# Patient Record
Sex: Female | Born: 1950 | Race: Black or African American | Hispanic: No | Smoking: Never smoker
Health system: Southern US, Community
[De-identification: ages and names within clinical notes are randomized; demographics above are authoritative.]

## PROBLEM LIST (undated history)

## (undated) DIAGNOSIS — E7800 Pure hypercholesterolemia, unspecified: Secondary | ICD-10-CM

## (undated) DIAGNOSIS — N189 Chronic kidney disease, unspecified: Secondary | ICD-10-CM

## (undated) DIAGNOSIS — M1990 Unspecified osteoarthritis, unspecified site: Secondary | ICD-10-CM

## (undated) DIAGNOSIS — H269 Unspecified cataract: Secondary | ICD-10-CM

## (undated) DIAGNOSIS — T7840XA Allergy, unspecified, initial encounter: Secondary | ICD-10-CM

## (undated) DIAGNOSIS — E079 Disorder of thyroid, unspecified: Secondary | ICD-10-CM

## (undated) DIAGNOSIS — I10 Essential (primary) hypertension: Secondary | ICD-10-CM

## (undated) DIAGNOSIS — E119 Type 2 diabetes mellitus without complications: Secondary | ICD-10-CM

## (undated) HISTORY — DX: Allergy, unspecified, initial encounter: T78.40XA

## (undated) HISTORY — DX: Unspecified cataract: H26.9

## (undated) HISTORY — PX: FRACTURE SURGERY: SHX138

## (undated) HISTORY — PX: COLONOSCOPY: SHX174

## (undated) HISTORY — DX: Chronic kidney disease, unspecified: N18.9

## (undated) HISTORY — PX: EYE SURGERY: SHX253

## (undated) HISTORY — DX: Essential (primary) hypertension: I10

## (undated) HISTORY — DX: Unspecified osteoarthritis, unspecified site: M19.90

## (undated) HISTORY — DX: Pure hypercholesterolemia, unspecified: E78.00

## (undated) HISTORY — PX: JOINT REPLACEMENT: SHX530

## (undated) HISTORY — PX: ABDOMINAL HYSTERECTOMY: SHX81

---

## 1998-08-18 ENCOUNTER — Other Ambulatory Visit: Admission: RE | Admit: 1998-08-18 | Discharge: 1998-08-18 | Payer: Self-pay | Admitting: Family Medicine

## 1999-10-24 ENCOUNTER — Other Ambulatory Visit: Admission: RE | Admit: 1999-10-24 | Discharge: 1999-10-24 | Payer: Self-pay | Admitting: Family Medicine

## 2001-05-12 ENCOUNTER — Other Ambulatory Visit: Admission: RE | Admit: 2001-05-12 | Discharge: 2001-05-12 | Payer: Self-pay | Admitting: Family Medicine

## 2003-09-21 ENCOUNTER — Other Ambulatory Visit: Admission: RE | Admit: 2003-09-21 | Discharge: 2003-09-21 | Payer: Self-pay | Admitting: Family Medicine

## 2005-02-08 ENCOUNTER — Other Ambulatory Visit: Admission: RE | Admit: 2005-02-08 | Discharge: 2005-02-08 | Payer: Self-pay | Admitting: Family Medicine

## 2005-04-22 ENCOUNTER — Emergency Department (HOSPITAL_COMMUNITY): Admission: EM | Admit: 2005-04-22 | Discharge: 2005-04-22 | Payer: Self-pay | Admitting: Emergency Medicine

## 2009-08-11 ENCOUNTER — Emergency Department (HOSPITAL_COMMUNITY): Admission: EM | Admit: 2009-08-11 | Discharge: 2009-08-11 | Payer: Self-pay | Admitting: Emergency Medicine

## 2010-05-30 DIAGNOSIS — N951 Menopausal and female climacteric states: Secondary | ICD-10-CM | POA: Insufficient documentation

## 2010-07-20 DIAGNOSIS — E785 Hyperlipidemia, unspecified: Secondary | ICD-10-CM | POA: Insufficient documentation

## 2010-07-20 DIAGNOSIS — K5900 Constipation, unspecified: Secondary | ICD-10-CM | POA: Insufficient documentation

## 2010-07-20 DIAGNOSIS — J309 Allergic rhinitis, unspecified: Secondary | ICD-10-CM | POA: Insufficient documentation

## 2010-07-20 DIAGNOSIS — N1831 Chronic kidney disease, stage 3a: Secondary | ICD-10-CM | POA: Insufficient documentation

## 2010-07-20 DIAGNOSIS — E039 Hypothyroidism, unspecified: Secondary | ICD-10-CM | POA: Insufficient documentation

## 2010-07-20 DIAGNOSIS — E119 Type 2 diabetes mellitus without complications: Secondary | ICD-10-CM | POA: Insufficient documentation

## 2010-07-24 DIAGNOSIS — E559 Vitamin D deficiency, unspecified: Secondary | ICD-10-CM | POA: Insufficient documentation

## 2010-07-26 LAB — URINALYSIS, ROUTINE W REFLEX MICROSCOPIC
Glucose, UA: NEGATIVE mg/dL
Protein, ur: 100 mg/dL — AB
Specific Gravity, Urine: 1.015 (ref 1.005–1.030)
Urobilinogen, UA: 0.2 mg/dL (ref 0.0–1.0)

## 2010-07-26 LAB — URINE CULTURE

## 2010-07-26 LAB — URINE MICROSCOPIC-ADD ON

## 2010-12-04 DIAGNOSIS — B372 Candidiasis of skin and nail: Secondary | ICD-10-CM | POA: Insufficient documentation

## 2012-01-02 ENCOUNTER — Encounter (HOSPITAL_COMMUNITY): Payer: Self-pay | Admitting: *Deleted

## 2012-01-02 ENCOUNTER — Emergency Department (HOSPITAL_COMMUNITY)
Admission: EM | Admit: 2012-01-02 | Discharge: 2012-01-02 | Disposition: A | Discharge status: Home / Self Care | Payer: 59 | Attending: Emergency Medicine | Admitting: Emergency Medicine

## 2012-01-02 DIAGNOSIS — Z91018 Allergy to other foods: Secondary | ICD-10-CM | POA: Insufficient documentation

## 2012-01-02 DIAGNOSIS — Z882 Allergy status to sulfonamides status: Secondary | ICD-10-CM | POA: Insufficient documentation

## 2012-01-02 DIAGNOSIS — R51 Headache: Secondary | ICD-10-CM | POA: Insufficient documentation

## 2012-01-02 DIAGNOSIS — I10 Essential (primary) hypertension: Secondary | ICD-10-CM | POA: Insufficient documentation

## 2012-01-02 HISTORY — DX: Disorder of thyroid, unspecified: E07.9

## 2012-01-02 HISTORY — DX: Essential (primary) hypertension: I10

## 2012-01-02 MED ORDER — IBUPROFEN 600 MG PO TABS: 600.0000 mg | ORAL_TABLET | Freq: Four times a day (QID) | ORAL | Status: AC | PRN

## 2012-01-02 NOTE — ED Notes (Signed)
Pt states approx 3 days ago started having right jaw pain. States went to dentist and nothing was found to be wrong with teeth. States now pain radiates from ear to jaw. States with certain movement pain will increase. States pain constant with intermittent sharp pain to right side of face. Sensation is equal bilateral to face. Facial features symmetric. Speech clear.
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
## 2012-12-08 LAB — HM COLONOSCOPY

## 2013-12-28 ENCOUNTER — Encounter: Payer: Self-pay | Admitting: Cardiology

## 2013-12-28 DIAGNOSIS — I10 Essential (primary) hypertension: Secondary | ICD-10-CM

## 2013-12-28 DIAGNOSIS — R9430 Abnormal result of cardiovascular function study, unspecified: Secondary | ICD-10-CM | POA: Insufficient documentation

## 2013-12-28 DIAGNOSIS — E7800 Pure hypercholesterolemia, unspecified: Secondary | ICD-10-CM

## 2013-12-28 HISTORY — DX: Essential (primary) hypertension: I10

## 2013-12-28 HISTORY — DX: Pure hypercholesterolemia, unspecified: E78.00

## 2013-12-29 ENCOUNTER — Ambulatory Visit (INDEPENDENT_AMBULATORY_CARE_PROVIDER_SITE_OTHER): Payer: 59 | Admitting: Interventional Cardiology

## 2013-12-29 ENCOUNTER — Encounter: Payer: Self-pay | Admitting: Interventional Cardiology

## 2013-12-29 VITALS — BP 138/80 | HR 71 | Ht 64.0 in | Wt 180.0 lb

## 2013-12-29 DIAGNOSIS — I10 Essential (primary) hypertension: Secondary | ICD-10-CM
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
## 2013-12-29 NOTE — Patient Instructions (Signed)
Your physician recommends that you schedule a follow-up appointment as needed  

## 2017-10-06 ENCOUNTER — Emergency Department: Payer: Worker's Compensation

## 2017-10-06 ENCOUNTER — Emergency Department
Admission: EM | Admit: 2017-10-06 | Discharge: 2017-10-07 | Disposition: A | Discharge status: Home / Self Care | Payer: Worker's Compensation | Attending: Emergency Medicine | Admitting: Emergency Medicine

## 2017-10-06 DIAGNOSIS — I10 Essential (primary) hypertension: Secondary | ICD-10-CM | POA: Insufficient documentation

## 2017-10-06 DIAGNOSIS — S99911A Unspecified injury of right ankle, initial encounter: Secondary | ICD-10-CM | POA: Diagnosis present

## 2017-10-06 DIAGNOSIS — W010XXA Fall on same level from slipping, tripping and stumbling without subsequent striking against object, initial encounter: Secondary | ICD-10-CM | POA: Insufficient documentation

## 2017-10-06 DIAGNOSIS — Y929 Unspecified place or not applicable: Secondary | ICD-10-CM | POA: Insufficient documentation

## 2017-10-06 DIAGNOSIS — Z7982 Long term (current) use of aspirin: Secondary | ICD-10-CM | POA: Insufficient documentation

## 2017-10-06 DIAGNOSIS — S9304XA Dislocation of right ankle joint, initial encounter: Secondary | ICD-10-CM | POA: Diagnosis not present

## 2017-10-06 DIAGNOSIS — S82831A Other fracture of upper and lower end of right fibula, initial encounter for closed fracture: Secondary | ICD-10-CM | POA: Insufficient documentation

## 2017-10-06 DIAGNOSIS — Z79899 Other long term (current) drug therapy: Secondary | ICD-10-CM | POA: Insufficient documentation

## 2017-10-06 DIAGNOSIS — Y9389 Activity, other specified: Secondary | ICD-10-CM | POA: Diagnosis not present

## 2017-10-06 DIAGNOSIS — S82851A Displaced trimalleolar fracture of right lower leg, initial encounter for closed fracture: Secondary | ICD-10-CM

## 2017-10-06 DIAGNOSIS — Y990 Civilian activity done for income or pay: Secondary | ICD-10-CM | POA: Diagnosis not present

## 2017-10-06 DIAGNOSIS — S82391A Other fracture of lower end of right tibia, initial encounter for closed fracture: Secondary | ICD-10-CM | POA: Diagnosis not present

## 2017-10-06 MED ORDER — HYDROMORPHONE HCL 1 MG/ML IJ SOLN
1.0000 mg | Freq: Once | INTRAMUSCULAR | Status: AC
  Administered 2017-10-06: 1 mg via INTRAVENOUS

## 2017-10-06 MED ORDER — ONDANSETRON HCL 4 MG/2ML IJ SOLN
INTRAMUSCULAR | Status: AC
  Filled 2017-10-06: qty 2

## 2017-10-06 MED ORDER — HYDROMORPHONE HCL 1 MG/ML IJ SOLN
1.0000 mg | Freq: Once | INTRAMUSCULAR | Status: AC
  Administered 2017-10-06: 1 mg via INTRAVENOUS
  Filled 2017-10-06: qty 1

## 2017-10-06 MED ORDER — LIDOCAINE HCL (PF) 1 % IJ SOLN
INTRAMUSCULAR | Status: AC
  Administered 2017-10-06: 5 mL
  Filled 2017-10-06: qty 15

## 2017-10-06 MED ORDER — HYDROMORPHONE HCL 1 MG/ML IJ SOLN
INTRAMUSCULAR | Status: DC
Start: 2017-10-06 — End: 2017-10-07
  Filled 2017-10-06: qty 1

## 2017-10-06 MED ORDER — LORAZEPAM 1 MG PO TABS
1.0000 mg | ORAL_TABLET | Freq: Once | ORAL | Status: AC
Start: 2017-10-06 — End: 2017-10-06
  Administered 2017-10-06: 1 mg via ORAL
  Filled 2017-10-06: qty 1

## 2017-10-06 MED ORDER — ONDANSETRON HCL 4 MG/2ML IJ SOLN
4.0000 mg | Freq: Once | INTRAMUSCULAR | Status: AC
Start: 2017-10-06 — End: 2017-10-06
  Administered 2017-10-06: 4 mg via INTRAVENOUS

## 2017-10-06 NOTE — ED Triage Notes (Signed)
[facility]  Pt fell and has obvious deformity to R ankle.  Pt is A&ox4, in NAD.

## 2017-10-06 NOTE — ED Provider Notes (Signed)
[facility] Emergency Department Provider Note  ____________________________________________  Time seen: Approximately 9:26 PM  I have reviewed the triage vital signs and the nursing notes.   HISTORY  Chief Complaint Ankle Pain    HPI Kelli Bond is a 67 y.o. female who presents the emergency department with a complaint of right ankle injury.  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  Patient was using a buffer machine when she slipped, catching her foot.  Patient reports obvious deformity which is correlated by EMS.  EMS has patient's ankle stabilized using a pillow surrounded by tape.  Patient reports significant pain with no mobility of the ankle at this time.  Patient did not hit her head or lose consciousness during the injury.  Patient was given 100 mcg of fentanyl in route by EMS.  IV established.  No other injury or complaint at this time.  No history of previous ankle injury or surgery.  Past Medical History:  Diagnosis Date  . Essential hypertension, benign 12/28/2013  . Hypertension   . Pure hypercholesterolemia 12/28/2013  . Thyroid disease     Patient Active Problem List   Diagnosis Date Noted  . Nonspecific abnormal unspecified cardiovascular function study 12/28/2013  . Pure hypercholesterolemia 12/28/2013  . Essential hypertension, benign 12/28/2013    Past Surgical History:  Procedure Laterality Date  . ABDOMINAL HYSTERECTOMY      Prior to Admission medications   Medication Sig Start Date End Date Taking? Authorizing Provider  aspirin EC 81 MG tablet Take 81 mg by mouth daily.    [provider]  cetirizine (ZYRTEC) 10 MG tablet Take 10 mg by mouth at bedtime.    [provider]  cholecalciferol (VITAMIN D) 1000 UNITS tablet Take 1,000 Units by mouth at bedtime.    [provider]  levothyroxine (SYNTHROID, LEVOTHROID) 112 MCG tablet Take 112 mcg by mouth  daily.    [provider]  lisinopril (PRINIVIL,ZESTRIL) 10 MG tablet Take 10 mg by mouth at bedtime.    [provider]  Olopatadine HCl (PATADAY) 0.2 % SOLN Apply 1 drop to eye at bedtime.    [provider]  oxyCODONE-acetaminophen (PERCOCET/ROXICET) 5-325 MG tablet Take 1 tablet by mouth every 6 (six) hours as needed for severe pain. 10/07/17   Cuthriell, Delorise Royals, PA-C  rosuvastatin (CRESTOR) 20 MG tablet Take 20 mg by mouth daily.    [provider]  saxagliptin HCl (ONGLYZA) 2.5 MG TABS tablet Take 2.5 mg by mouth daily.    [provider]    Allergies Banana and Sulfa antibiotics  Family History  Problem Relation Age of Onset  . Hypertension Father   . Kidney disease Father   . Bradycardia Mother        pacemaker  . Breast cancer Sister   . Kidney disease Brother     Social History Social History   Tobacco Use  . Smoking status: Never Smoker  Substance Use Topics  . Alcohol use: Not Currently  . Drug use: Not Currently     Review of Systems  Constitutional: No fever/chills Eyes: No visual changes.  Cardiovascular: no chest pain. Respiratory: no cough. No SOB. Gastrointestinal: No abdominal pain.  No nausea, no vomiting. Musculoskeletal: Positive for right ankle injury/deformity Skin: Negative for rash, abrasions, lacerations, ecchymosis. Neurological: Negative for headaches, focal weakness or numbness. 10-point ROS otherwise negative.  ____________________________________________   PHYSICAL EXAM:  VITAL SIGNS: ED  Triage Vitals [10/06/17 1935]  Enc Vitals Group     BP (!) 142/62     Pulse Rate 70     Resp 18     Temp 97.8 F (36.6 C)     Temp Source Oral     SpO2 97 %     Weight 180 lb (81.6 kg)     Height      Head Circumference      Peak Flow      Pain Score 9     Pain Loc      Pain Edu?      Excl. in GC?      Constitutional: Alert and oriented. Well appearing and in no acute distress. Eyes:  Conjunctivae are normal. PERRL. EOMI. Head: Atraumatic. Neck: No stridor.    Cardiovascular: Normal rate, regular rhythm. Normal S1 and S2.  Good peripheral circulation. Respiratory: Normal respiratory effort without tachypnea or retractions. Lungs CTAB. Good air entry to the bases with no decreased or absent breath sounds. Musculoskeletal: Patient with right lower extremity/ankle immobilized using pillow and tape from EMS.  After cutting through tape and exposing ankle, obvious deformity to the left ankle with rotation and angulation of the foot.  Dorsalis pedis pulse had been appreciated by EMS and marked in the field.  Patient's foot is cool but appreciable dorsalis pedis pulse.  Sensation intact all 5 digits.  Capillary refill less than 2 seconds all digits.  Palpation of the ankle reveals significant deformity to bilateral malleolus. Neurologic:  Normal speech and language. No gross focal neurologic deficits are appreciated.  Skin:  Skin is warm, dry and intact. No rash noted. Psychiatric: Mood and affect are normal. Speech and behavior are normal. Patient exhibits appropriate insight and judgement.   ____________________________________________   LABS (all labs ordered are listed, but only abnormal results are displayed)  Labs Reviewed - No data to display ____________________________________________  EKG   ____________________________________________  RADIOLOGY Festus Barren Cuthriell, personally viewed and evaluated these images (plain radiographs) as part of my medical decision making, as well as reviewing the written report by the radiologist.  Concur with radiologist finding of acute, comminuted distal tibial and fibular fractures with dislocation of the talus.  Postreduction films reveal  Dg Tibia/fibula Right  Result Date: 10/06/2017 CLINICAL DATA:  Recent fall with ankle deformity EXAM: RIGHT TIBIA AND FIBULA - 2 VIEW COMPARISON:  None. FINDINGS: Distal tibial and fibular  fractures are noted with lateral and posterior displacement of the talus with respect to the distal tibia. No definitive posterior malleolar fracture is noted. IMPRESSION: Distal tibial and fibular fractures with talar dislocation Electronically Signed   By: Alcide Clever M.D.   On: 10/06/2017 20:31   Dg Ankle Complete Right  Result Date: 10/06/2017 CLINICAL DATA:  Fracture, postreduction. EXAM: RIGHT ANKLE - COMPLETE 3+ VIEW COMPARISON:  Pre reduction radiographs earlier this day. FINDINGS: Improved alignment of trimalleolar fractures postreduction. Oblique distal fibular fracture with improved alignment, mild residual displacement. Transverse medial malleolar fracture with mild residual displacement, minimal residual displacement posterior malleolar fracture. Improved ankle mortise alignment with mild residual widening of the medial clear space. Overlying cast material in place limits osseous and soft tissue fine detail. IMPRESSION: Improved distal tibia and fibular fractures postreduction. Electronically Signed   By: Rubye Oaks M.D.   On: 10/06/2017 23:42   Dg Ankle Complete Right  Result Date: 10/06/2017 CLINICAL DATA:  Recent fall with ankle deformity EXAM: RIGHT ANKLE - COMPLETE 3+ VIEW COMPARISON:  None. FINDINGS: Comminuted distal fibular and tibial fractures are noted. There is lateral and posterior displacement of the talus with respect to the distal tibia. Soft tissue swelling is noted. IMPRESSION: Comminuted distal tibial and fibular fractures with posterior and lateral dislocation of the talus Electronically Signed   By: Alcide CleverMark  Lukens M.D.   On: 10/06/2017 20:28    ____________________________________________    PROCEDURES  Procedure(s) performed:    .Nerve Block Date/Time: 10/06/2017 10:58 PM Performed by: Racheal Patchesuthriell, Jonathan D, PA-C Authorized by: Racheal Patchesuthriell, Jonathan D, PA-C   Consent:    Consent obtained:  Verbal   Consent given by:  Patient   Risks discussed:  Swelling,  pain and unsuccessful block Indications:    Indications:  Pain relief and procedural anesthesia Location:    Body area:  Lower extremity   Lower extremity nerve:  Sural (and superficial peroneal)   Laterality:  Right Pre-procedure details:    Skin preparation:  Alcohol   Preparation: Patient was prepped and draped in usual sterile fashion   Skin anesthesia (see MAR for exact dosages):    Skin anesthesia method:  None Procedure details (see MAR for exact dosages):    Block needle gauge:  22 G   Anesthetic injected:  Lidocaine 1% w/o epi   Steroid injected:  None   Additive injected:  None   Injection procedure:  Anatomic landmarks identified, anatomic landmarks palpated, introduced needle, incremental injection and negative aspiration for blood Post-procedure details:    Dressing:  None   Outcome:  Anesthesia achieved   Patient tolerance of procedure:  Tolerated well, no immediate complications Reduction of fracture Date/Time: 10/06/2017 11:00 PM Performed by: Racheal Patchesuthriell, Jonathan D, PA-C Authorized by: Racheal Patchesuthriell, Jonathan D, PA-C  Consent: Verbal consent obtained. Risks and benefits: risks, benefits and alternatives were discussed Consent given by: patient Patient understanding: patient states understanding of the procedure being performed Required items: required blood products, implants, devices, and special equipment available Patient identity confirmed: verbally with patient and arm band Time out: Immediately prior to procedure a "time out" was called to verify the correct patient, procedure, equipment, support staff and site/side marked as required. Preparation: Patient was prepped and draped in the usual sterile fashion. Local anesthesia used: Nerve block.  Anesthesia: Local anesthesia used: Nerve block.  Sedation: Patient sedated: no  Patient tolerance: Patient tolerated the procedure well with no immediate complications       Medications  ondansetron (ZOFRAN)  injection 4 mg (4 mg Intravenous Given 10/06/17 1942)  HYDROmorphone (DILAUDID) injection 1 mg (1 mg Intravenous Given 10/06/17 1942)  lidocaine (PF) (XYLOCAINE) 1 % injection (5 mLs  Given 10/06/17 2223)  HYDROmorphone (DILAUDID) injection 1 mg (1 mg Intravenous Given 10/06/17 2222)  LORazepam (ATIVAN) tablet 1 mg (1 mg Oral Given 10/06/17 2222)     ____________________________________________   INITIAL IMPRESSION / ASSESSMENT AND PLAN / ED COURSE  Pertinent labs & imaging results that were available during my care of the patient were reviewed by me and considered in my medical decision making (see chart for details).  Review of the [place] CSRS was performed in accordance of the [facility] prior to dispensing any controlled drugs.     Patient's diagnosis is consistent with trimalleolar fracture with talus dislocation.  Patient presented with significant ankle injury with obvious deformity.  Patient was neurovascularly intact upon initial assessment.  X-ray reveals significant fibular and tibia fractures with associated talus dislocation.  I discussed the case with Dr. Rosita KeaMenz, the on-call orthopedic surgeon.  He recommends  reduction in the emergency department, splinting, follow-up with orthopedics.  Patient was given pain medication, antianxiety medication, nerve blocks of the sural and superficial peroneal nerves with good anesthesia.  Ankle is manually reduced in the emergency department with good palpable reduction.  Postreduction films revealed significant improvement in fractures, alignment of fibula, tibia, ankle mortise joint.  Splint is applied, using posterior OCL and stirrup ankle splint.  Patient is given crutches for ambulation.. Patient will be discharged home with prescriptions for Percocet for pain relief.. Patient is to follow up with orthopedics for further management or primary care as needed or otherwise directed. Patient is given ED precautions to return to the ED for any worsening or new  symptoms.     ____________________________________________  FINAL CLINICAL IMPRESSION(S) / ED DIAGNOSES  Final diagnoses:  Closed trimalleolar fracture of right ankle, initial encounter  Closed dislocation of right talus, initial encounter      NEW MEDICATIONS STARTED DURING THIS VISIT:  ED Discharge Orders        Ordered    oxyCODONE-acetaminophen (PERCOCET/ROXICET) 5-325 MG tablet  Every 6 hours PRN     10/07/17 0010          This chart was dictated using voice recognition software/Dragon. Despite best efforts to proofread, errors can occur which can change the meaning. Any change was purely unintentional.    Racheal Patches, PA-C 10/07/17 0010    Phineas Semen, MD 10/09/17 1101

## 2017-10-06 NOTE — ED Notes (Addendum)
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 No drug screen was performed.

## 2017-10-07 MED ORDER — OXYCODONE-ACETAMINOPHEN 5-325 MG PO TABS: 1.0000 | ORAL_TABLET | Freq: Four times a day (QID) | ORAL | 0 refills | Status: DC | PRN

## 2017-10-07 MED ORDER — OXYCODONE-ACETAMINOPHEN 5-325 MG PO TABS
2.0000 | ORAL_TABLET | Freq: Once | ORAL | Status: AC
  Administered 2017-10-07: 2 via ORAL
  Filled 2017-10-07: qty 2

## 2017-10-07 MED ORDER — OXYCODONE-ACETAMINOPHEN 5-325 MG PO TABS: 1.0000 | ORAL_TABLET | Freq: Once | ORAL | Status: DC

## 2017-10-07 NOTE — ED Notes (Signed)
Pt discharged to home.  Family member driving.  Discharge instructions reviewed.  Verbalized understanding.  No questions or concerns at this time.  Teach back verified.  Pt in NAD.  No items left in ED.   

## 2017-10-08 DIAGNOSIS — S82851A Displaced trimalleolar fracture of right lower leg, initial encounter for closed fracture: Secondary | ICD-10-CM | POA: Insufficient documentation

## 2017-10-10 ENCOUNTER — Encounter
Admission: RE | Disposition: A | Discharge status: Home / Self Care | Payer: Self-pay | Source: Ambulatory Visit | Attending: Orthopedic Surgery

## 2017-10-10 ENCOUNTER — Ambulatory Visit: Payer: Worker's Compensation | Admitting: Anesthesiology

## 2017-10-10 ENCOUNTER — Ambulatory Visit
Admission: RE | Admit: 2017-10-10 | Discharge: 2017-10-10 | Disposition: A | Discharge status: Home / Self Care | Payer: Worker's Compensation | Source: Ambulatory Visit | Attending: Orthopedic Surgery | Admitting: Orthopedic Surgery

## 2017-10-10 ENCOUNTER — Ambulatory Visit: Payer: Worker's Compensation

## 2017-10-10 ENCOUNTER — Other Ambulatory Visit: Payer: Self-pay

## 2017-10-10 DIAGNOSIS — Y990 Civilian activity done for income or pay: Secondary | ICD-10-CM | POA: Insufficient documentation

## 2017-10-10 DIAGNOSIS — E119 Type 2 diabetes mellitus without complications: Secondary | ICD-10-CM | POA: Diagnosis not present

## 2017-10-10 DIAGNOSIS — Y9289 Other specified places as the place of occurrence of the external cause: Secondary | ICD-10-CM | POA: Insufficient documentation

## 2017-10-10 DIAGNOSIS — Y9389 Activity, other specified: Secondary | ICD-10-CM | POA: Insufficient documentation

## 2017-10-10 DIAGNOSIS — Z79899 Other long term (current) drug therapy: Secondary | ICD-10-CM | POA: Insufficient documentation

## 2017-10-10 DIAGNOSIS — Z7984 Long term (current) use of oral hypoglycemic drugs: Secondary | ICD-10-CM | POA: Diagnosis not present

## 2017-10-10 DIAGNOSIS — Z882 Allergy status to sulfonamides status: Secondary | ICD-10-CM | POA: Diagnosis not present

## 2017-10-10 DIAGNOSIS — E039 Hypothyroidism, unspecified: Secondary | ICD-10-CM | POA: Insufficient documentation

## 2017-10-10 DIAGNOSIS — W3189XA Contact with other specified machinery, initial encounter: Secondary | ICD-10-CM | POA: Insufficient documentation

## 2017-10-10 DIAGNOSIS — S82851A Displaced trimalleolar fracture of right lower leg, initial encounter for closed fracture: Secondary | ICD-10-CM | POA: Diagnosis not present

## 2017-10-10 DIAGNOSIS — E7800 Pure hypercholesterolemia, unspecified: Secondary | ICD-10-CM | POA: Insufficient documentation

## 2017-10-10 DIAGNOSIS — I10 Essential (primary) hypertension: Secondary | ICD-10-CM | POA: Diagnosis not present

## 2017-10-10 DIAGNOSIS — Z419 Encounter for procedure for purposes other than remedying health state, unspecified: Secondary | ICD-10-CM

## 2017-10-10 DIAGNOSIS — Z7982 Long term (current) use of aspirin: Secondary | ICD-10-CM | POA: Diagnosis not present

## 2017-10-10 HISTORY — DX: Type 2 diabetes mellitus without complications: E11.9

## 2017-10-10 HISTORY — PX: ORIF ANKLE FRACTURE: SHX5408

## 2017-10-10 LAB — GLUCOSE, CAPILLARY
GLUCOSE-CAPILLARY: 58 mg/dL — AB (ref 65–99)
Glucose-Capillary: 116 mg/dL — ABNORMAL HIGH (ref 65–99)

## 2017-10-10 SURGERY — OPEN REDUCTION INTERNAL FIXATION (ORIF) ANKLE FRACTURE
Anesthesia: General | Site: Ankle | Laterality: Right | Wound class: Clean

## 2017-10-10 MED ORDER — HYDROMORPHONE HCL 1 MG/ML IJ SOLN: 0.2500 mg | INTRAMUSCULAR | Status: DC | PRN

## 2017-10-10 MED ORDER — MEPERIDINE HCL 50 MG/ML IJ SOLN: 6.2500 mg | INTRAMUSCULAR | Status: DC | PRN

## 2017-10-10 MED ORDER — NEOMYCIN-POLYMYXIN B GU 40-200000 IR SOLN
Status: DC | PRN
  Administered 2017-10-10: 2 mL

## 2017-10-10 MED ORDER — ONDANSETRON HCL 4 MG/2ML IJ SOLN
INTRAMUSCULAR | Status: AC
  Filled 2017-10-10: qty 2

## 2017-10-10 MED ORDER — FENTANYL CITRATE (PF) 100 MCG/2ML IJ SOLN
INTRAMUSCULAR | Status: DC | PRN
  Administered 2017-10-10 (×4): 25 ug via INTRAVENOUS
  Administered 2017-10-10 (×2): 50 ug via INTRAVENOUS

## 2017-10-10 MED ORDER — PROPOFOL 10 MG/ML IV BOLUS
INTRAVENOUS | Status: DC | PRN
  Administered 2017-10-10: 150 mg via INTRAVENOUS

## 2017-10-10 MED ORDER — GLYCOPYRROLATE 0.2 MG/ML IJ SOLN
INTRAMUSCULAR | Status: AC
  Filled 2017-10-10: qty 1

## 2017-10-10 MED ORDER — HYDROMORPHONE HCL 1 MG/ML IJ SOLN
0.2500 mg | INTRAMUSCULAR | Status: DC | PRN
  Administered 2017-10-10 (×2): 0.25 mg via INTRAVENOUS
  Administered 2017-10-10 (×2): 0.5 mg via INTRAVENOUS
  Administered 2017-10-10 (×2): 0.25 mg via INTRAVENOUS
  Administered 2017-10-10: 0.5 mg via INTRAVENOUS

## 2017-10-10 MED ORDER — HYDROCODONE-ACETAMINOPHEN 5-325 MG PO TABS
1.5000 | ORAL_TABLET | Freq: Once | ORAL | Status: AC | PRN
  Administered 2017-10-10: 1 via ORAL

## 2017-10-10 MED ORDER — DEXTROSE 50 % IV SOLN
INTRAVENOUS | Status: AC
  Filled 2017-10-10: qty 50

## 2017-10-10 MED ORDER — NEOMYCIN-POLYMYXIN B GU 40-200000 IR SOLN
Status: AC
  Filled 2017-10-10: qty 2

## 2017-10-10 MED ORDER — HYDROCODONE-ACETAMINOPHEN 5-325 MG PO TABS
ORAL_TABLET | ORAL | Status: AC
  Filled 2017-10-10: qty 1

## 2017-10-10 MED ORDER — CEFAZOLIN SODIUM-DEXTROSE 1-4 GM/50ML-% IV SOLN
INTRAVENOUS | Status: DC | PRN
  Administered 2017-10-10: 2 g via INTRAVENOUS

## 2017-10-10 MED ORDER — OXYCODONE-ACETAMINOPHEN 7.5-325 MG PO TABS: 1.0000 | ORAL_TABLET | ORAL | 0 refills | Status: DC | PRN

## 2017-10-10 MED ORDER — MIDAZOLAM HCL 2 MG/2ML IJ SOLN
INTRAMUSCULAR | Status: DC | PRN
  Administered 2017-10-10: 2 mg via INTRAVENOUS

## 2017-10-10 MED ORDER — HYDROMORPHONE HCL 1 MG/ML IJ SOLN
INTRAMUSCULAR | Status: AC
  Administered 2017-10-10: 0.25 mg via INTRAVENOUS
  Filled 2017-10-10: qty 1

## 2017-10-10 MED ORDER — DEXAMETHASONE SODIUM PHOSPHATE 10 MG/ML IJ SOLN
INTRAMUSCULAR | Status: AC
  Filled 2017-10-10: qty 1

## 2017-10-10 MED ORDER — ACETAMINOPHEN 160 MG/5ML PO SOLN
325.0000 mg | ORAL | Status: DC | PRN
  Filled 2017-10-10: qty 20.3

## 2017-10-10 MED ORDER — HYDROMORPHONE HCL 1 MG/ML IJ SOLN
INTRAMUSCULAR | Status: AC
  Administered 2017-10-10: 0.5 mg via INTRAVENOUS
  Filled 2017-10-10: qty 1

## 2017-10-10 MED ORDER — GLYCOPYRROLATE 0.2 MG/ML IJ SOLN
INTRAMUSCULAR | Status: DC | PRN
  Administered 2017-10-10: 0.2 mg via INTRAVENOUS

## 2017-10-10 MED ORDER — ONDANSETRON HCL 4 MG/2ML IJ SOLN
INTRAMUSCULAR | Status: DC | PRN
  Administered 2017-10-10: 4 mg via INTRAVENOUS

## 2017-10-10 MED ORDER — CEFAZOLIN SODIUM-DEXTROSE 2-4 GM/100ML-% IV SOLN
INTRAVENOUS | Status: AC
  Filled 2017-10-10: qty 100

## 2017-10-10 MED ORDER — ACETAMINOPHEN 325 MG PO TABS: 325.0000 mg | ORAL_TABLET | ORAL | Status: DC | PRN

## 2017-10-10 MED ORDER — MIDAZOLAM HCL 2 MG/2ML IJ SOLN
INTRAMUSCULAR | Status: AC
  Filled 2017-10-10: qty 2

## 2017-10-10 MED ORDER — LACTATED RINGERS IV SOLN
INTRAVENOUS | Status: DC
  Administered 2017-10-10 (×2): via INTRAVENOUS

## 2017-10-10 MED ORDER — FENTANYL CITRATE (PF) 250 MCG/5ML IJ SOLN
INTRAMUSCULAR | Status: AC
  Filled 2017-10-10: qty 5

## 2017-10-10 MED ORDER — DEXTROSE 5 % IV SOLN
INTRAVENOUS | Status: DC | PRN
  Administered 2017-10-10: 14:00:00 via INTRAVENOUS

## 2017-10-10 MED ORDER — PROMETHAZINE HCL 25 MG/ML IJ SOLN: 6.2500 mg | INTRAMUSCULAR | Status: DC | PRN

## 2017-10-10 MED ORDER — DEXAMETHASONE SODIUM PHOSPHATE 10 MG/ML IJ SOLN
INTRAMUSCULAR | Status: DC | PRN
  Administered 2017-10-10: 10 mg via INTRAVENOUS

## 2017-10-10 MED ORDER — LIDOCAINE HCL (CARDIAC) PF 100 MG/5ML IV SOSY
PREFILLED_SYRINGE | INTRAVENOUS | Status: DC | PRN
  Administered 2017-10-10: 80 mg via INTRAVENOUS

## 2017-10-10 SURGICAL SUPPLY — 56 items
BANDAGE ACE 4X5 VEL STRL LF (GAUZE/BANDAGES/DRESSINGS) ×4 IMPLANT
BIT DRILL 2.5X2.75 QC CALB (BIT) ×2 IMPLANT
BIT DRILL CALIBRATED 2.7 (BIT) ×1 IMPLANT
BIT DRILL CALIBRATED 2.7MM (BIT) ×1
CANISTER SUCT 1200ML W/VALVE (MISCELLANEOUS) ×3 IMPLANT
CHLORAPREP W/TINT 26ML (MISCELLANEOUS) ×3 IMPLANT
CUFF TOURN 24 STER (MISCELLANEOUS) IMPLANT
CUFF TOURN 30 STER DUAL PORT (MISCELLANEOUS) IMPLANT
DRAPE FLUOR MINI C-ARM 54X84 (DRAPES) ×3 IMPLANT
DRAPE INCISE IOBAN 66X45 STRL (DRAPES) ×3 IMPLANT
DRAPE U-SHAPE 47X51 STRL (DRAPES) ×3 IMPLANT
DRSG EMULSION OIL 3X8 NADH (GAUZE/BANDAGES/DRESSINGS) ×3 IMPLANT
ELECT CAUTERY BLADE 6.4 (BLADE) ×3 IMPLANT
ELECT REM PT RETURN 9FT ADLT (ELECTROSURGICAL) ×3
ELECTRODE REM PT RTRN 9FT ADLT (ELECTROSURGICAL) ×1 IMPLANT
GAUZE PETRO XEROFOAM 1X8 (MISCELLANEOUS) ×3 IMPLANT
GAUZE SPONGE 4X4 12PLY STRL (GAUZE/BANDAGES/DRESSINGS) ×3 IMPLANT
GAUZE XEROFORM 4X4 STRL (GAUZE/BANDAGES/DRESSINGS) ×2 IMPLANT
GLOVE SURG SYN 9.0  PF PI (GLOVE) ×2
GLOVE SURG SYN 9.0 PF PI (GLOVE) ×1 IMPLANT
GOWN SRG 2XL LVL 4 RGLN SLV (GOWNS) ×1 IMPLANT
GOWN STRL NON-REIN 2XL LVL4 (GOWNS) ×3
GOWN STRL REUS W/ TWL LRG LVL3 (GOWN DISPOSABLE) ×1 IMPLANT
GOWN STRL REUS W/TWL LRG LVL3 (GOWN DISPOSABLE) ×3
HEMOVAC 400ML (MISCELLANEOUS)
K-WIRE ACE 1.6X6 (WIRE) ×3
KIT DRAIN HEMOVAC JP 7FR 400ML (MISCELLANEOUS) IMPLANT
KIT TURNOVER KIT A (KITS) ×5 IMPLANT
KWIRE ACE 1.6X6 (WIRE) IMPLANT
LABEL OR SOLS (LABEL) ×3 IMPLANT
NS IRRIG 1000ML POUR BTL (IV SOLUTION) ×3 IMPLANT
PACK EXTREMITY ARMC (MISCELLANEOUS) ×3 IMPLANT
PAD ABD DERMACEA PRESS 5X9 (GAUZE/BANDAGES/DRESSINGS) ×4 IMPLANT
PAD CAST CTTN 4X4 STRL (SOFTGOODS) ×2 IMPLANT
PAD PREP 24X41 OB/GYN DISP (PERSONAL CARE ITEMS) ×3 IMPLANT
PADDING CAST COTTON 4X4 STRL (SOFTGOODS) ×6
PLATE FIBULAR COMP LOCK 10H (Plate) ×2 IMPLANT
SCREW LOCK CORT STAR 3.5X12 (Screw) ×2 IMPLANT
SCREW LOCK CORT STAR 3.5X14 (Screw) ×2 IMPLANT
SCREW LOCK CORT STAR 3.5X16 (Screw) ×2 IMPLANT
SCREW LOW PROFILE 12MMX3.5MM (Screw) ×2 IMPLANT
SCREW LP NON LOCK 3.5X10MM (Screw) ×2 IMPLANT
SCREW NON LOCKING LP 3.5 16MM (Screw) ×6 IMPLANT
SPLINT CAST 1 STEP 3X12 (MISCELLANEOUS) ×4 IMPLANT
SPONGE LAP 18X18 RF (DISPOSABLE) ×3 IMPLANT
STAPLER SKIN PROX 35W (STAPLE) ×5 IMPLANT
SUT ETHILON 3-0 FS-10 30 BLK (SUTURE) ×3
SUT MNCRL AB 4-0 PS2 18 (SUTURE) ×6 IMPLANT
SUT VIC AB 0 CT1 36 (SUTURE) ×3 IMPLANT
SUT VIC AB 2-0 SH 27 (SUTURE) ×6
SUT VIC AB 2-0 SH 27XBRD (SUTURE) ×2 IMPLANT
SUT VIC AB 3-0 SH 27 (SUTURE) ×3
SUT VIC AB 3-0 SH 27X BRD (SUTURE) ×1 IMPLANT
SUT VIC AB 4-0 FS2 27 (SUTURE) ×2 IMPLANT
SUTURE EHLN 3-0 FS-10 30 BLK (SUTURE) ×1 IMPLANT
SYR 10ML LL (SYRINGE) ×3 IMPLANT

## 2017-10-10 NOTE — Op Note (Signed)
10/10/2017  3:45 PM  PATIENT:  Kelli Bond  67 y.o. female  PRE-OPERATIVE DIAGNOSIS:  CLOSED TRIMALLEOLAR FRACTURE OF RIGH TANKLE  POST-OPERATIVE DIAGNOSIS:  CLOSED TRIMALLEOLAR FRACTURE OF RIGH TANKLE  PROCEDURE:  Procedure(s): OPEN REDUCTION INTERNAL FIXATION (ORIF) ANKLE FRACTURE (Right) Medial and lateral malleolus SURGEON: Leitha SchullerMichael J Dimitra Woodstock, MD  ASSISTANTS: None  ANESTHESIA:   general  EBL:  Total I/O In: 1250 [I.V.:1250] Out: -   BLOOD ADMINISTERED:none  DRAINS: none   LOCAL MEDICATIONS USED:  NONE  SPECIMEN:  No Specimen  DISPOSITION OF SPECIMEN:  N/A  COUNTS:  YES  TOURNIQUET:  * Missing tourniquet times found for documented tourniquets in log: [id number] *  IMPLANTS: Biomet composite locking plate with multiple screws  DICTATION: .Dragon Dictation patient was brought to the operating room and after adequate anesthesia was obtained the right leg was prepped draped you sterile fashion with tourniquet applied the upper thigh.  After patient identification timeout procedures were completed tourniquet was raised and distal incision made over the distal fibula.  The fracture site was exposed and a large amount of clot removed and a clamp used to get anatomic alignment.  A composite plate was then chosen based on getting multiple screws above well above the fracture with a long spiral fracture the 5 distal screw holes were filled using one nonlocking to pull the plate to the distal fibula and then locking screws into the fibula with percutaneous technique of getting cortical screws proximally this gave anatomic alignment to the distal fibula.  Next the medial malleolus was approached through anterior medial incision the fragment was quite small and really was not large at all to fragment but a large fragment portion of the deltoid ligament was attached to this and a direct repair was performed with open reduction internal fixation with suture repair of the medial malleolar  fragment.  The wounds were then thoroughly irrigated and permanent serum views obtained.  The deltoid was repaired with #1 Vicryl there is used 2-0 Vicryl for the skin and skin staples followed by Xeroform 4 x 4's web roll ABD and stirrup splint followed by Ace wrap  PLAN OF CARE: Discharge to home after PACU  PATIENT DISPOSITION:  PACU - hemodynamically stable.

## 2017-10-10 NOTE — Anesthesia Preprocedure Evaluation (Signed)
Anesthesia Evaluation  Patient identified by MRN, date of birth, ID band Patient awake    Reviewed: Allergy & Precautions, H&P , NPO status , reviewed documented beta blocker date and time   Airway Mallampati: II  TM Distance: >3 FB Neck ROM: full    Dental  (+) Chipped   Pulmonary neg pulmonary ROS,    Pulmonary exam normal        Cardiovascular hypertension, On Medications Normal cardiovascular exam     Neuro/Psych negative neurological ROS  negative psych ROS   GI/Hepatic negative GI ROS, Neg liver ROS, neg GERD  ,  Endo/Other  negative endocrine ROS  Renal/GU      Musculoskeletal   Abdominal   Peds  Hematology negative hematology ROS (+)   Anesthesia Other Findings Past Medical History: 12/28/2013: Essential hypertension, benign No date: Hypertension 12/28/2013: Pure hypercholesterolemia No date: Thyroid disease Past Surgical History: No date: ABDOMINAL HYSTERECTOMY BMI    Body Mass Index:  30.80 kg/m     Reproductive/Obstetrics                             Anesthesia Physical Anesthesia Plan  ASA: II  Anesthesia Plan: General LMA   Post-op Pain Management:    Induction:   PONV Risk Score and Plan: Ondansetron, Treatment may vary due to age or medical condition and Midazolam  Airway Management Planned:   Additional Equipment:   Intra-op Plan:   Post-operative Plan:   Informed Consent: I have reviewed the patients History and Physical, chart, labs and discussed the procedure including the risks, benefits and alternatives for the proposed anesthesia with the patient or authorized representative who has indicated his/her understanding and acceptance.   Dental Advisory Given  Plan Discussed with: CRNA  Anesthesia Plan Comments:         Anesthesia Quick Evaluation

## 2017-10-10 NOTE — Anesthesia Procedure Notes (Signed)
Procedure Name: LMA Insertion Date/Time: 10/10/2017 2:20 PM Performed by: Junious SilkNoles, Michaell Grider, CRNA Pre-anesthesia Checklist: Patient identified, Emergency Drugs available, Suction available, Patient being monitored and Timeout performed Patient Re-evaluated:Patient Re-evaluated prior to induction Oxygen Delivery Method: Circle system utilized Preoxygenation: Pre-oxygenation with 100% oxygen Induction Type: IV induction Ventilation: Mask ventilation without difficulty LMA: LMA inserted LMA Size: 3.5 Number of attempts: 1 Placement Confirmation: positive ETCO2 and breath sounds checked- equal and bilateral Tube secured with: Tape Dental Injury: Teeth and Oropharynx as per pre-operative assessment

## 2017-10-10 NOTE — Progress Notes (Signed)
pts blood sugar 58, Kathy OR RN made aware

## 2017-10-10 NOTE — Progress Notes (Signed)
Right foot elevated on pillows   Skin warm and dry capillary refill positive on right  Can wiggle toes

## 2017-10-10 NOTE — Transfer of Care (Signed)
Immediate Anesthesia Transfer of Care Note  Patient: Kelli Bond  Procedure(s) Performed: OPEN REDUCTION INTERNAL FIXATION (ORIF) ANKLE FRACTURE (Right Ankle)  Patient Location: PACU  Anesthesia Type:General  Level of Consciousness: awake and alert   Airway & Oxygen Therapy: Patient Spontanous Breathing and Patient connected to face mask  Post-op Assessment: Report given to RN and Patient moving all extremities  Post vital signs: Reviewed and stable  Last Vitals:  Vitals Value Taken Time  BP 126/84 10/10/2017  3:49 PM  Temp 36.3 C 10/10/2017  3:49 PM  Pulse 71 10/10/2017  3:51 PM  Resp 11 10/10/2017  3:51 PM  SpO2 100 % 10/10/2017  3:51 PM  Vitals shown include unvalidated device data.  Last Pain:  Vitals:   10/10/17 1101  TempSrc: Temporal  PainSc: 0-No pain         Complications: No apparent anesthesia complications

## 2017-10-10 NOTE — H&P (Signed)
Reviewed paper H+P, will be scanned into chart. No changes noted.  

## 2017-10-10 NOTE — Progress Notes (Signed)
Dr Providence LaniusHowell made aware that pt went to OR with blood sugar of 58.

## 2017-10-10 NOTE — Discharge Instructions (Signed)
Keep leg elevated as much as possible.  Pain medicine as directed.  Aspirin 325 mg a day.  Call if you are having problems.  All the small amount of bloody drainage is not unexpected.

## 2017-10-10 NOTE — Anesthesia Post-op Follow-up Note (Signed)
Anesthesia QCDR form completed.        

## 2017-10-11 ENCOUNTER — Encounter: Payer: Self-pay | Admitting: Orthopedic Surgery

## 2017-10-11 NOTE — Anesthesia Postprocedure Evaluation (Signed)
Anesthesia Post Note  Patient: Kelli Bond  Procedure(s) Performed: OPEN REDUCTION INTERNAL FIXATION (ORIF) ANKLE FRACTURE (Right Ankle)  Patient location during evaluation: PACU Anesthesia Type: General Level of consciousness: awake and alert Pain management: pain level controlled Vital Signs Assessment: post-procedure vital signs reviewed and stable Respiratory status: spontaneous breathing, nonlabored ventilation, respiratory function stable and patient connected to nasal cannula oxygen Cardiovascular status: blood pressure returned to baseline and stable Postop Assessment: no apparent nausea or vomiting Anesthetic complications: no     Last Vitals:  Vitals:   10/10/17 1715 10/10/17 1743  BP: (!) 151/76 132/66  Pulse: 70 (!) 52  Resp: 16   Temp: (!) 35.9 C   SpO2: 97% 96%    Last Pain:  Vitals:   10/10/17 1753  TempSrc:   PainSc: 3                  Mancel Lardizabal Garry Heater Aeriel Boulay

## 2018-02-04 ENCOUNTER — Ambulatory Visit (INDEPENDENT_AMBULATORY_CARE_PROVIDER_SITE_OTHER): Payer: Medicare Other | Admitting: Internal Medicine

## 2018-02-04 ENCOUNTER — Encounter: Payer: Self-pay | Admitting: Internal Medicine

## 2018-02-04 VITALS — BP 124/68 | HR 54 | Temp 98.3°F | Ht 65.0 in | Wt 181.8 lb

## 2018-02-04 DIAGNOSIS — N182 Chronic kidney disease, stage 2 (mild): Secondary | ICD-10-CM

## 2018-02-04 DIAGNOSIS — Z6830 Body mass index (BMI) 30.0-30.9, adult: Secondary | ICD-10-CM

## 2018-02-04 DIAGNOSIS — M25571 Pain in right ankle and joints of right foot: Secondary | ICD-10-CM

## 2018-02-04 DIAGNOSIS — G8929 Other chronic pain: Secondary | ICD-10-CM

## 2018-02-04 DIAGNOSIS — IMO0002 Reserved for concepts with insufficient information to code with codable children: Secondary | ICD-10-CM

## 2018-02-04 DIAGNOSIS — E1365 Other specified diabetes mellitus with hyperglycemia: Secondary | ICD-10-CM

## 2018-02-04 DIAGNOSIS — I129 Hypertensive chronic kidney disease with stage 1 through stage 4 chronic kidney disease, or unspecified chronic kidney disease: Secondary | ICD-10-CM

## 2018-02-04 DIAGNOSIS — E1322 Other specified diabetes mellitus with diabetic chronic kidney disease: Secondary | ICD-10-CM

## 2018-02-04 DIAGNOSIS — Z79899 Other long term (current) drug therapy: Secondary | ICD-10-CM

## 2018-02-04 DIAGNOSIS — E559 Vitamin D deficiency, unspecified: Secondary | ICD-10-CM

## 2018-02-04 DIAGNOSIS — Z23 Encounter for immunization: Secondary | ICD-10-CM

## 2018-02-04 MED ORDER — TRAMADOL HCL 50 MG PO TABS: 50.0000 mg | ORAL_TABLET | Freq: Four times a day (QID) | ORAL | 0 refills | Status: DC | PRN

## 2018-02-04 NOTE — Progress Notes (Signed)
   Subjective:    Patient ID: Kelli Bond, female    DOB: April 12, 1951, 67 y.o.   MRN: [id number]  Diabetes  She presents for her follow-up diabetic visit. She has type 2 diabetes mellitus. Her disease course has been stable. Risk factors for coronary artery disease include diabetes mellitus and hypertension. There is no change in her home blood glucose trend. Her breakfast blood glucose range is generally 90-110 mg/dl.  Hypertension  This is a chronic problem. The current episode started more than 1 year ago. The problem is unchanged. The problem is controlled. Risk factors for coronary artery disease include diabetes mellitus and post-menopausal state.      Review of Systems  Constitutional: Negative.   HENT: Negative.   Eyes: Negative.   Respiratory: Negative.   Cardiovascular: Negative.   Musculoskeletal: Positive for arthralgias (SHE C/O R ANKLE PAIN. HAD FX JUNE 2019).   Past Medical History:  Diagnosis Date  . Diabetes mellitus without complication (HCC)   . Essential hypertension, benign 12/28/2013  . Hypertension   . Pure hypercholesterolemia 12/28/2013  . Thyroid disease     Vitals:   02/04/18 1148  BP: 124/68  Pulse: (!) 54  Temp: 98.3 F (36.8 C)  SpO2: 95%   Vitals:   02/04/18 1148  Weight: 181 lb 12.8 oz (82.5 kg)  Height: 5\' 5"  (1.651 m)      Objective:   Physical Exam  Constitutional: She appears well-developed and well-nourished.  Neck: Normal range of motion.  Cardiovascular: Normal rate, regular rhythm and normal heart sounds.  Pulmonary/Chest: Effort normal and breath sounds normal.  Musculoskeletal: She exhibits tenderness (MEDIAL R ANKLE).       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  - Plan: Hemoglobin A1c, CANCELED: CMP w Anion Gap (STAT/Sunquest-performed on-site), CANCELED: ANA, IFA (with reflex), CANCELED: CYCLIC CITRUL  PEPTIDE ANTIBODY, IGG/IGA, CANCELED: Rheumatoid factor, CANCELED: Sedimentation rate, CANCELED: Uric acid, CANCELED: CMP w Anion Gap (STAT/Sunquest-performed on-site)  Chronic kidney disease, stage II (mild) - Plan: CANCELED: ANA, IFA (with reflex), CANCELED: CYCLIC CITRUL PEPTIDE ANTIBODY, IGG/IGA, CANCELED: Rheumatoid factor, CANCELED: Sedimentation rate, CANCELED: Uric acid  Benign hypertensive renal disease - WELL CONTROLLED. SHE WILL CONTINUE WITH CURRENT MEDS.  - Plan: CANCELED: ANA, IFA (with reflex), CANCELED: CYCLIC CITRUL PEPTIDE ANTIBODY, IGG/IGA, CANCELED: Rheumatoid factor, CANCELED: Sedimentation rate, CANCELED: Uric acid  Pharmacologic therapy - Plan: CANCELED: ANA, IFA (with reflex), CANCELED: CYCLIC CITRUL PEPTIDE ANTIBODY, IGG/IGA, CANCELED: Rheumatoid factor, CANCELED: Sedimentation rate, CANCELED: Uric acid  Chronic pain of right ankle - RX TOPICAL COMPOUNDED PAIN CREAM WILL BE CALLED INTO [facility] - Plan: traMADol (ULTRAM) 50 MG tablet, CANCELED: ANA, IFA (with reflex), CANCELED: CYCLIC CITRUL PEPTIDE ANTIBODY, IGG/IGA, CANCELED: Rheumatoid factor, CANCELED: Sedimentation rate, CANCELED: Uric acid  Vitamin D deficiency disease - Plan: Vitamin D (25 hydroxy), CANCELED: ANA, IFA (with reflex), CANCELED: CYCLIC CITRUL PEPTIDE ANTIBODY, IGG/IGA, CANCELED: Rheumatoid factor, CANCELED: Sedimentation rate, CANCELED: Uric acid  Need for influenza vaccination - SHE WAS GIVEN HIGH DOSE FLU VACCINE.  - Plan: Flu vaccine HIGH DOSE PF (Fluzone High dose), CANCELED: ANA, IFA (with reflex), CANCELED: CYCLIC CITRUL PEPTIDE ANTIBODY, IGG/IGA, CANCELED: Rheumatoid factor, CANCELED: Sedimentation rate, CANCELED: Uric acid

## 2018-02-05 LAB — COMPREHENSIVE METABOLIC PANEL
A/G RATIO: 2 (ref 1.2–2.2)
ALT: 19 IU/L (ref 0–32)
AST: 25 IU/L (ref 0–40)
Albumin: 4.7 g/dL (ref 3.6–4.8)
Alkaline Phosphatase: 75 IU/L (ref 39–117)
BUN/Creatinine Ratio: 15 (ref 12–28)
BUN: 16 mg/dL (ref 8–27)
Bilirubin Total: 0.5 mg/dL (ref 0.0–1.2)
CALCIUM: 10.2 mg/dL (ref 8.7–10.3)
CO2: 27 mmol/L (ref 20–29)
Chloride: 97 mmol/L (ref 96–106)
Creatinine, Ser: 1.1 mg/dL — ABNORMAL HIGH (ref 0.57–1.00)
GFR, EST AFRICAN AMERICAN: 60 mL/min/{1.73_m2} (ref >59)
GFR, EST NON AFRICAN AMERICAN: 52 mL/min/{1.73_m2} — AB (ref >59)
GLOBULIN, TOTAL: 2.4 g/dL (ref 1.5–4.5)
Glucose: 90 mg/dL (ref 65–99)
POTASSIUM: 4.5 mmol/L (ref 3.5–5.2)
Sodium: 141 mmol/L (ref 134–144)
TOTAL PROTEIN: 7.1 g/dL (ref 6.0–8.5)

## 2018-02-05 LAB — VITAMIN D 25 HYDROXY (VIT D DEFICIENCY, FRACTURES): VIT D 25 HYDROXY: 49.6 ng/mL (ref 30.0–100.0)

## 2018-02-05 LAB — HEMOGLOBIN A1C
Est. average glucose Bld gHb Est-mCnc: 126 mg/dL
Hgb A1c MFr Bld: 6 % — ABNORMAL HIGH (ref 4.8–5.6)

## 2018-02-09 NOTE — Progress Notes (Signed)
YOUR A1C IS 6.0, THIS IS PRETTY GOOD. YOUR VIT D LEVEL IS GREAT. CONTINUE WITH CURRENT SUPPLEMENTATION. YOUR KIDNEY FUNCTION IS STABLE. BE SURE TO INCREASE YOUR WATER INTAKE.

## 2018-02-11 ENCOUNTER — Other Ambulatory Visit: Payer: Self-pay | Admitting: Internal Medicine

## 2018-04-07 ENCOUNTER — Ambulatory Visit (INDEPENDENT_AMBULATORY_CARE_PROVIDER_SITE_OTHER): Payer: Medicare Other | Admitting: Nurse Practitioner

## 2018-04-07 ENCOUNTER — Encounter: Payer: Self-pay | Admitting: Nurse Practitioner

## 2018-04-07 VITALS — BP 130/76 | HR 61 | Temp 98.2°F | Ht 64.25 in | Wt 188.0 lb

## 2018-04-07 DIAGNOSIS — J069 Acute upper respiratory infection, unspecified: Secondary | ICD-10-CM

## 2018-04-07 MED ORDER — MOMETASONE FUROATE 50 MCG/ACT NA SUSP: 2.0000 | Freq: Every day | NASAL | 2 refills | Status: DC

## 2018-04-07 MED ORDER — AMOXICILLIN 875 MG PO TABS: 875.0000 mg | ORAL_TABLET | Freq: Two times a day (BID) | ORAL | 0 refills | Status: DC

## 2018-04-07 NOTE — Progress Notes (Signed)
Subjective:     Patient ID: Kelli Bond , female    DOB: 1951-04-21 , 67 y.o.   MRN: [id number]   Chief Complaint  Patient presents with  . URI    Patient states she has some nasal congestion, drainage and sinus pressure and chest congestion and nonproductive cough    HPI  URI   This is a new problem. The current episode started 1 to 4 weeks ago. The problem has been gradually improving. There has been no fever. Associated symptoms include congestion, coughing and headaches. Pertinent negatives include no abdominal pain, chest pain, dysuria, nausea, rhinorrhea, sore throat, vomiting or wheezing. She has tried decongestant (HBP coricidan) for the symptoms. The treatment provided mild relief.     Past Medical History:  Diagnosis Date  . Diabetes mellitus without complication (HCC)   . Essential hypertension, benign 12/28/2013  . Hypertension   . Pure hypercholesterolemia 12/28/2013  . Thyroid disease      Family History  Problem Relation Age of Onset  . Hypertension Father   . Kidney disease Father   . Bradycardia Mother        pacemaker  . Breast cancer Sister   . Kidney disease Brother      Current Outpatient Medications:  .  aspirin EC 81 MG tablet, Take 81 mg by mouth daily., Disp: , Rfl:  .  cetirizine (ZYRTEC) 10 MG tablet, Take 10 mg by mouth daily as needed for allergies. prn, Disp: , Rfl:  .  cholecalciferol (VITAMIN D) 1000 UNITS tablet, Take 1,000 Units by mouth daily. , Disp: , Rfl:  .  docusate sodium (COLACE) 100 MG capsule, Take by mouth., Disp: , Rfl:  .  latanoprost (XALATAN) 0.005 % ophthalmic solution, Place 1 drop into both eyes at bedtime., Disp: , Rfl:  .  levothyroxine (SYNTHROID, LEVOTHROID) 112 MCG tablet, Take 112 mcg by mouth daily before breakfast. , Disp: , Rfl:  .  ONGLYZA 2.5 MG TABS tablet, TAKE 1 TABLET BY MOUTH EVERY DAY, Disp: 90 tablet, Rfl: 1 .  rosuvastatin (CRESTOR) 20 MG tablet, Take 20 mg by mouth daily., Disp: , Rfl:  .   telmisartan-hydrochlorothiazide (MICARDIS HCT) 40-12.5 MG tablet, Take 1 tablet by mouth daily., Disp: , Rfl:  .  traMADol (ULTRAM) 50 MG tablet, Take 1 tablet (50 mg total) by mouth every 6 (six) hours as needed., Disp: 20 tablet, Rfl: 0   Allergies  Allergen Reactions  . Banana Other (See Comments)    Tingling in mouth.  . Sulfa Antibiotics Rash     Review of Systems  Constitutional: Negative.   HENT: Positive for congestion and postnasal drip. Negative for rhinorrhea, sinus pressure and sore throat.   Eyes: Negative for photophobia.  Respiratory: Positive for cough. Negative for chest tightness, shortness of breath, wheezing and stridor.   Cardiovascular: Negative for chest pain, palpitations and leg swelling.  Gastrointestinal: Negative for abdominal pain, nausea and vomiting.  Genitourinary: Negative for dysuria.  Musculoskeletal: Negative.   Neurological: Positive for headaches. Negative for dizziness and light-headedness.     Today's Vitals   04/07/18 1624  BP: 130/76  Pulse: 61  Temp: 98.2 F (36.8 C)  TempSrc: Oral  SpO2: 90%  Weight: 188 lb (85.3 kg)  Height: 5' 4.25" (1.632 m)  PainSc: 0-No pain   Body mass index is 32.02 kg/m.   Objective:  Physical Exam  Constitutional: She appears well-developed and well-nourished.  HENT:  Head: Normocephalic.  Right Ear: External ear normal. Tympanic  membrane is bulging.  Left Ear: External ear normal. Tympanic membrane is bulging.  Nose: Nose normal.  Mouth/Throat: Uvula is midline and mucous membranes are normal. Oropharyngeal exudate present. Tonsils are 0 on the right. Tonsils are 0 on the left.  Cardiovascular: Normal rate, regular rhythm, normal heart sounds and normal pulses.  Pulmonary/Chest: Effort normal. She has no decreased breath sounds.        Assessment And Plan:     1. Upper respiratory tract infection, unspecified type  Take amoxicillin as directed until completely gone.  Continue to use HBP  coricidan - amoxicillin (AMOXIL) 875 MG tablet; Take 1 tablet (875 mg total) by mouth 2 (two) times daily.  Dispense: 14 tablet; Refill: 0 - mometasone (NASONEX) 50 MCG/ACT nasal spray; Place 2 sprays into the nose daily.  Dispense: 17 g; Refill: 2     Arnette FeltsJanece Dystany Duffy, FNP

## 2018-04-07 NOTE — Patient Instructions (Signed)
Upper Respiratory Infection, Adult Most upper respiratory infections (URIs) are caused by a virus. A URI affects the nose, throat, and upper air passages. The most common type of URI is often called "the common cold." Follow these instructions at home:  Take medicines only as told by your doctor.  Gargle warm saltwater or take cough drops to comfort your throat as told by your doctor.  Use a warm mist humidifier or inhale steam from a shower to increase air moisture. This may make it easier to breathe.  Drink enough fluid to keep your pee (urine) clear or pale yellow.  Eat soups and other clear broths.  Have a healthy diet.  Rest as needed.  Go back to work when your fever is gone or your doctor says it is okay. ? You may need to stay home longer to avoid giving your URI to others. ? You can also wear a face mask and wash your hands often to prevent spread of the virus.  Use your inhaler more if you have asthma.  Do not use any tobacco products, including cigarettes, chewing tobacco, or electronic cigarettes. If you need help quitting, ask your doctor. Contact a doctor if:  You are getting worse, not better.  Your symptoms are not helped by medicine.  You have chills.  You are getting more short of breath.  You have brown or red mucus.  You have yellow or brown discharge from your nose.  You have pain in your face, especially when you bend forward.  You have a fever.  You have puffy (swollen) neck glands.  You have pain while swallowing.  You have white areas in the back of your throat. Get help right away if:  You have very bad or constant: ? Headache. ? Ear pain. ? Pain in your forehead, behind your eyes, and over your cheekbones (sinus pain). ? Chest pain.  You have long-lasting (chronic) lung disease and any of the following: ? Wheezing. ? Long-lasting cough. ? Coughing up blood. ? A change in your usual mucus.  You have a stiff neck.  You have  changes in your: ? Vision. ? Hearing. ? Thinking. ? Mood. This information is not intended to replace advice given to you by your health care provider. Make sure you discuss any questions you have with your health care provider. Document Released: 10/10/2007 Document Revised: 12/25/2015 Document Reviewed: 07/29/2013 Elsevier Interactive Patient Education  2018 Elsevier Inc.  

## 2018-05-14 ENCOUNTER — Encounter: Payer: Self-pay | Admitting: Internal Medicine

## 2018-05-14 ENCOUNTER — Ambulatory Visit (INDEPENDENT_AMBULATORY_CARE_PROVIDER_SITE_OTHER): Payer: Medicare Other | Admitting: Internal Medicine

## 2018-05-14 VITALS — BP 120/72 | HR 52 | Temp 98.1°F | Ht 64.25 in | Wt 187.6 lb

## 2018-05-14 DIAGNOSIS — E039 Hypothyroidism, unspecified: Secondary | ICD-10-CM

## 2018-05-14 DIAGNOSIS — I129 Hypertensive chronic kidney disease with stage 1 through stage 4 chronic kidney disease, or unspecified chronic kidney disease: Secondary | ICD-10-CM | POA: Diagnosis not present

## 2018-05-14 DIAGNOSIS — E2839 Other primary ovarian failure: Secondary | ICD-10-CM

## 2018-05-14 DIAGNOSIS — N182 Chronic kidney disease, stage 2 (mild): Secondary | ICD-10-CM

## 2018-05-14 DIAGNOSIS — E1122 Type 2 diabetes mellitus with diabetic chronic kidney disease: Secondary | ICD-10-CM

## 2018-05-14 DIAGNOSIS — Z6831 Body mass index (BMI) 31.0-31.9, adult: Secondary | ICD-10-CM

## 2018-05-14 DIAGNOSIS — Z1239 Encounter for other screening for malignant neoplasm of breast: Secondary | ICD-10-CM

## 2018-05-14 DIAGNOSIS — E6609 Other obesity due to excess calories: Secondary | ICD-10-CM

## 2018-05-14 NOTE — Patient Instructions (Signed)

## 2018-05-15 ENCOUNTER — Encounter: Payer: Self-pay | Admitting: Internal Medicine

## 2018-05-15 LAB — TSH: TSH: 3.6 u[IU]/mL (ref 0.450–4.500)

## 2018-05-15 LAB — LIPID PANEL
CHOLESTEROL TOTAL: 113 mg/dL (ref 100–199)
Chol/HDL Ratio: 2.4 ratio (ref 0.0–4.4)
HDL: 48 mg/dL (ref >39)
LDL Calculated: 47 mg/dL (ref 0–99)
Triglycerides: 90 mg/dL (ref 0–149)
VLDL CHOLESTEROL CAL: 18 mg/dL (ref 5–40)

## 2018-05-15 LAB — T4, FREE: Free T4: 1.28 ng/dL (ref 0.82–1.77)

## 2018-05-15 LAB — BMP8+EGFR
BUN/Creatinine Ratio: 10 — ABNORMAL LOW (ref 12–28)
BUN: 10 mg/dL (ref 8–27)
CHLORIDE: 102 mmol/L (ref 96–106)
CO2: 27 mmol/L (ref 20–29)
Calcium: 9.8 mg/dL (ref 8.7–10.3)
Creatinine, Ser: 0.96 mg/dL (ref 0.57–1.00)
GFR calc Af Amer: 71 mL/min/{1.73_m2} (ref >59)
GFR calc non Af Amer: 62 mL/min/{1.73_m2} (ref >59)
GLUCOSE: 87 mg/dL (ref 65–99)
POTASSIUM: 4.7 mmol/L (ref 3.5–5.2)
SODIUM: 141 mmol/L (ref 134–144)

## 2018-05-15 LAB — HEMOGLOBIN A1C
ESTIMATED AVERAGE GLUCOSE: 128 mg/dL
HEMOGLOBIN A1C: 6.1 % — AB (ref 4.8–5.6)

## 2018-05-15 NOTE — Progress Notes (Signed)
Here are your lab results:  Your thyroid function is within normal limits; but it appears dose needs to be tweaked. Please confirm the dose you are currently taking. Have you been feeling tired?   Your hba1c is 6.1 - this is pretty good. Your liver and kidney function are stable. Your cholesterol is great.   Please let me know if you have any questions!  Sincerely,    Trinika Cortese N. Allyne Gee, MD

## 2018-05-15 NOTE — Progress Notes (Signed)
Subjective:     Patient ID: Kelli Bond , female    DOB: [birth year] , 68 y.o.   MRN: [id number]   Chief Complaint  Patient presents with  . Diabetes  . Hypertension    HPI  Diabetes  She presents for her follow-up diabetic visit. She has type 2 diabetes mellitus. Her disease course has been stable. There are no hypoglycemic associated symptoms. Pertinent negatives for diabetes include no blurred vision and no chest pain. There are no hypoglycemic complications. Risk factors for coronary artery disease include diabetes mellitus, dyslipidemia, hypertension, obesity, sedentary lifestyle and post-menopausal.  Hypertension  This is a chronic problem. The current episode started more than 1 year ago. The problem has been gradually improving since onset. The problem is controlled. Pertinent negatives include no blurred vision, chest pain, palpitations or shortness of breath. Risk factors for coronary artery disease include diabetes mellitus, dyslipidemia, post-menopausal state and sedentary lifestyle.   She reports compliance with meds.   Past Medical History:  Diagnosis Date  . Diabetes mellitus without complication ([place])   . Essential hypertension, benign 12/28/2013  . Hypertension   . Pure hypercholesterolemia 12/28/2013  . Thyroid disease      Family History  Problem Relation Age of Onset  . Hypertension Father   . Kidney disease Father   . Bradycardia Mother        pacemaker  . Breast cancer Sister   . Kidney disease Brother      Current Outpatient Medications:  .  aspirin EC 81 MG tablet, Take 81 mg by mouth daily., Disp: , Rfl:  .  cetirizine (ZYRTEC) 10 MG tablet, Take 10 mg by mouth daily as needed for allergies. prn, Disp: , Rfl:  .  cholecalciferol (VITAMIN D) 1000 UNITS tablet, Take 1,000 Units by mouth daily. , Disp: , Rfl:  .  docusate sodium (COLACE) 100 MG capsule, Take by mouth., Disp: , Rfl:  .  latanoprost (XALATAN) 0.005 % ophthalmic solution, Place 1 drop  into both eyes at bedtime., Disp: , Rfl:  .  levothyroxine (SYNTHROID, LEVOTHROID) 112 MCG tablet, Take 112 mcg by mouth daily before breakfast. , Disp: , Rfl:  .  mometasone (NASONEX) 50 MCG/ACT nasal spray, Place 2 sprays into the nose daily., Disp: 17 g, Rfl: 2 .  ONGLYZA 2.5 MG TABS tablet, TAKE 1 TABLET BY MOUTH EVERY DAY, Disp: 90 tablet, Rfl: 1 .  rosuvastatin (CRESTOR) 20 MG tablet, Take 20 mg by mouth daily., Disp: , Rfl:  .  telmisartan-hydrochlorothiazide (MICARDIS HCT) 40-12.5 MG tablet, Take 1 tablet by mouth daily., Disp: , Rfl:  .  traMADol (ULTRAM) 50 MG tablet, Take 1 tablet (50 mg total) by mouth every 6 (six) hours as needed., Disp: 20 tablet, Rfl: 0   Allergies  Allergen Reactions  . Banana Other (See Comments)    Tingling in mouth.  . Sulfa Antibiotics Rash     Review of Systems  Constitutional: Negative.   Eyes: Negative for blurred vision.  Respiratory: Negative.  Negative for shortness of breath.   Cardiovascular: Negative.  Negative for chest pain and palpitations.  Gastrointestinal: Negative.   Neurological: Negative.   Psychiatric/Behavioral: Negative.      Today's Vitals   05/14/18 1121  BP: 120/72  Pulse: (!) 52  Temp: 98.1 F (36.7 C)  TempSrc: Oral  Weight: 187 lb 9.6 oz (85.1 kg)  Height: 5' 4.25" (1.632 m)  PainSc: 1   PainLoc: Foot   Body mass index is 31.95 kg/m.  Objective:  Physical Exam Vitals signs and nursing note reviewed.  Constitutional:      Appearance: Normal appearance. She is obese.  HENT:     Head: Normocephalic and atraumatic.  Cardiovascular:     Rate and Rhythm: Normal rate and regular rhythm.     Heart sounds: Normal heart sounds.  Pulmonary:     Effort: Pulmonary effort is normal.     Breath sounds: Normal breath sounds.  Abdominal:     General: Abdomen is flat. Bowel sounds are normal.  Skin:    General: Skin is warm.  Neurological:     General: No focal deficit present.     Mental Status: She is alert.   Psychiatric:        Mood and Affect: Mood normal.         Assessment And Plan:     1. Type 2 diabetes mellitus with stage 2 chronic kidney disease, without long-term current use of insulin ([place])  I will check labs as listed below. She is encouraged to resume regular exercise. She is encouraged to aim for at least 30 minutes five days weekly.   - BMP8+EGFR - Hemoglobin A1c - Lipid Profile  2. Hypertensive nephropathy  Well controlled. She will continue with current meds. She is encouraged to avoid adding salt to her foods.   3. Primary hypothyroidism  I will check a thyroid panel and adjust meds as needed.   - TSH - T4, Free  4. Class 1 obesity due to excess calories with serious comorbidity and body mass index (BMI) of 31.0 to 31.9 in adult  She is encouraged to strive for BMI less than 27 to decrease cardiac risk.   5. Estrogen deficiency  Pt advised that I will try to have this scheduled the same day as her upcoming mammogram.   - DG Bone Density; Future  6. Breast cancer screening   She is scheduled for mammogram at [facility] later this month.       [name], MD

## 2018-05-16 ENCOUNTER — Encounter: Payer: Self-pay | Admitting: Internal Medicine

## 2018-06-05 ENCOUNTER — Other Ambulatory Visit: Payer: Self-pay | Admitting: Nurse Practitioner

## 2018-06-05 ENCOUNTER — Other Ambulatory Visit: Payer: Self-pay | Admitting: Internal Medicine

## 2018-06-05 DIAGNOSIS — J069 Acute upper respiratory infection, unspecified: Secondary | ICD-10-CM

## 2018-06-11 ENCOUNTER — Other Ambulatory Visit: Payer: Self-pay | Admitting: Internal Medicine

## 2018-06-11 DIAGNOSIS — G8929 Other chronic pain: Secondary | ICD-10-CM

## 2018-06-11 DIAGNOSIS — M25571 Pain in right ankle and joints of right foot: Principal | ICD-10-CM

## 2018-06-12 NOTE — Telephone Encounter (Signed)
Tramadol refill

## 2018-06-24 ENCOUNTER — Telehealth: Payer: Self-pay

## 2018-06-24 NOTE — Telephone Encounter (Signed)
-----   Message from Dorothyann Peng, MD sent at 06/23/2018  8:04 PM EST ----- Pls contact pt - bone density is significant for osteopenia. Needs to walk 30 minutes at least 3 days per week. It is also important to integrate some weight lifting into her exercise regimen to help build her bone density. She has had significant decrease in bone density since her last study.   Also, take calcium and vit d.

## 2018-06-24 NOTE — Telephone Encounter (Signed)
Left the pt a message to call back for her bone density results.

## 2018-06-25 ENCOUNTER — Encounter: Payer: Self-pay | Admitting: Internal Medicine

## 2018-07-23 ENCOUNTER — Encounter: Payer: Self-pay | Admitting: Internal Medicine

## 2018-08-13 ENCOUNTER — Other Ambulatory Visit: Payer: Self-pay

## 2018-08-13 ENCOUNTER — Ambulatory Visit (INDEPENDENT_AMBULATORY_CARE_PROVIDER_SITE_OTHER): Payer: Medicare Other | Admitting: Internal Medicine

## 2018-08-13 ENCOUNTER — Encounter: Payer: Self-pay | Admitting: Internal Medicine

## 2018-08-13 VITALS — BP 122/68 | HR 51 | Temp 97.6°F | Ht 64.25 in | Wt 189.2 lb

## 2018-08-13 DIAGNOSIS — Z6832 Body mass index (BMI) 32.0-32.9, adult: Secondary | ICD-10-CM

## 2018-08-13 DIAGNOSIS — I129 Hypertensive chronic kidney disease with stage 1 through stage 4 chronic kidney disease, or unspecified chronic kidney disease: Secondary | ICD-10-CM

## 2018-08-13 DIAGNOSIS — E039 Hypothyroidism, unspecified: Secondary | ICD-10-CM

## 2018-08-13 DIAGNOSIS — N182 Chronic kidney disease, stage 2 (mild): Secondary | ICD-10-CM | POA: Diagnosis not present

## 2018-08-13 DIAGNOSIS — E6609 Other obesity due to excess calories: Secondary | ICD-10-CM

## 2018-08-13 DIAGNOSIS — E1122 Type 2 diabetes mellitus with diabetic chronic kidney disease: Secondary | ICD-10-CM

## 2018-08-13 DIAGNOSIS — J302 Other seasonal allergic rhinitis: Secondary | ICD-10-CM

## 2018-08-13 MED ORDER — FLUTICASONE PROPIONATE 50 MCG/ACT NA SUSP: 2.0000 | Freq: Every day | NASAL | 2 refills | Status: DC

## 2018-08-13 NOTE — Patient Instructions (Signed)

## 2018-08-13 NOTE — Progress Notes (Signed)
Subjective:     Patient ID: Kelli Bond , female    DOB: 1950/06/23 , 68 y.o.   MRN: [id number]   Chief Complaint  Patient presents with  . Hypothyroidism    HPI  Thyroid Problem  Presents for follow-up visit. Patient reports no constipation, depressed mood, diaphoresis, heat intolerance, hoarse voice, palpitations, visual change or weight gain. The symptoms have been stable.  Diabetes  She presents for her follow-up diabetic visit. She has type 2 diabetes mellitus. Her disease course has been stable. There are no hypoglycemic associated symptoms. Pertinent negatives for diabetes include no blurred vision, no chest pain and no visual change. There are no hypoglycemic complications. Risk factors for coronary artery disease include diabetes mellitus, dyslipidemia, hypertension, sedentary lifestyle and post-menopausal. An ACE inhibitor/angiotensin II receptor blocker is being taken. She does not see a podiatrist.Eye exam is current.  Hypertension  This is a chronic problem. The current episode started more than 1 year ago. The problem has been gradually improving since onset. The problem is controlled. Pertinent negatives include no blurred vision, chest pain, palpitations or shortness of breath. Identifiable causes of hypertension include a thyroid problem.   Reports compliance with meds.   Past Medical History:  Diagnosis Date  . Diabetes mellitus without complication ([place])   . Essential hypertension, benign 12/28/2013  . Hypertension   . Pure hypercholesterolemia 12/28/2013  . Thyroid disease      Family History  Problem Relation Age of Onset  . Hypertension Father   . Kidney disease Father   . Bradycardia Mother        pacemaker  . Breast cancer Sister   . Kidney disease Brother      Current Outpatient Medications:  .  aspirin EC 81 MG tablet, Take 81 mg by mouth daily., Disp: , Rfl:  .  cetirizine (ZYRTEC) 10 MG tablet, Take 10 mg by mouth daily as needed for allergies.  prn, Disp: , Rfl:  .  cholecalciferol (VITAMIN D) 1000 UNITS tablet, Take 1,000 Units by mouth daily. , Disp: , Rfl:  .  docusate sodium (COLACE) 100 MG capsule, Take by mouth., Disp: , Rfl:  .  latanoprost (XALATAN) 0.005 % ophthalmic solution, Place 1 drop into both eyes at bedtime., Disp: , Rfl:  .  ONGLYZA 2.5 MG TABS tablet, TAKE 1 TABLET BY MOUTH EVERY DAY, Disp: 90 tablet, Rfl: 1 .  rosuvastatin (CRESTOR) 20 MG tablet, Take 20 mg by mouth daily., Disp: , Rfl:  .  SYNTHROID 112 MCG tablet, TAKE 1 TABLET BY MOUTH EVERY DAY, Disp: 30 tablet, Rfl: 3 .  telmisartan-hydrochlorothiazide (MICARDIS HCT) 40-12.5 MG tablet, Take 1 tablet by mouth daily., Disp: , Rfl:  .  traMADol (ULTRAM) 50 MG tablet, TAKE 1 TABLET BY MOUTH EVERY 6 HOURS AS NEEDED, Disp: 30 tablet, Rfl: 0 .  fluticasone (FLONASE ALLERGY RELIEF) 50 MCG/ACT nasal spray, Place 2 sprays into both nostrils daily., Disp: 16 g, Rfl: 2   Allergies  Allergen Reactions  . Banana Other (See Comments)    Tingling in mouth.  . Sulfa Antibiotics Rash     Review of Systems  Constitutional: Negative.  Negative for diaphoresis and weight gain.  HENT: Positive for postnasal drip and sneezing. Negative for hoarse voice.   Eyes: Negative for blurred vision.  Respiratory: Negative.  Negative for shortness of breath.   Cardiovascular: Negative.  Negative for chest pain and palpitations.  Gastrointestinal: Negative.  Negative for constipation.  Endocrine: Negative for heat intolerance.  Neurological: Negative.   Psychiatric/Behavioral: Negative.      Today's Vitals   08/13/18 1128  BP: 122/68  Pulse: (!) 51  Temp: 97.6 F (36.4 C)  TempSrc: Oral  Weight: 189 lb 3.2 oz (85.8 kg)  Height: 5' 4.25" (1.632 m)  PainSc: 0-No pain   Body mass index is 32.22 kg/m.   Objective:  Physical Exam Vitals signs and nursing note reviewed.  Constitutional:      Appearance: Normal appearance.  HENT:     Head: Normocephalic and atraumatic.   Cardiovascular:     Rate and Rhythm: Normal rate and regular rhythm.     Heart sounds: Normal heart sounds.  Pulmonary:     Effort: Pulmonary effort is normal.     Breath sounds: Normal breath sounds.  Skin:    General: Skin is warm.  Neurological:     General: No focal deficit present.     Mental Status: She is alert.  Psychiatric:        Mood and Affect: Mood normal.        Behavior: Behavior normal.         Assessment And Plan:     1. Primary hypothyroidism  I will check thyroid panel and adjust meds as needed.  - TSH  2. Type 2 diabetes mellitus with stage 2 chronic kidney disease, without long-term current use of insulin ([place])  I will check labs as listed below. Importance of dietary compliance was discussed with the patient. Importance of regular exercise was discussed with the patient.  I will d/c onglyza due to cost. She was given samples of Januvia 140m daily. She will rto in five weeks for labwork. I will check a bmp at that time.   - BMP8+EGFR - Hemoglobin A1c  3. Hypertensive nephropathy  Well controlled. She will continue with current meds. She is encouraged to avoid adding salt to her foods.   4. Seasonal allergies  Rx flonase 1-2 sprays each nostril was sent to the pharmacy.   5. Class 1 obesity due to excess calories with serious comorbidity and body mass index (BMI) of 32.0 to 32.9 in adult  Importance of achieving optimal weight to decrease risk of cardiovascular disease and cancers was discussed with the patient in full detail. She is encouraged to start slowly - start with 10 minutes twice daily at least three to four days per week and to gradually build to 30 minutes five days weekly. She was given tips to incorporate more activity into her daily routine - take stairs when possible, park farther away from grocery stores, etc.      RMaximino Greenland MD    THE PATIENT IS ENCOURAGED TO PRACTICE SOCIAL DISTANCING DUE TO THE COVID-19 PANDEMIC.

## 2018-08-14 LAB — BMP8+EGFR
BUN/Creatinine Ratio: 13 (ref 12–28)
BUN: 12 mg/dL (ref 8–27)
CO2: 26 mmol/L (ref 20–29)
Calcium: 9.6 mg/dL (ref 8.7–10.3)
Chloride: 102 mmol/L (ref 96–106)
Creatinine, Ser: 0.89 mg/dL (ref 0.57–1.00)
GFR calc Af Amer: 78 mL/min/{1.73_m2} (ref >59)
GFR calc non Af Amer: 67 mL/min/{1.73_m2} (ref >59)
Glucose: 86 mg/dL (ref 65–99)
Potassium: 4.4 mmol/L (ref 3.5–5.2)
Sodium: 143 mmol/L (ref 134–144)

## 2018-08-14 LAB — TSH: TSH: 0.97 u[IU]/mL (ref 0.450–4.500)

## 2018-08-14 LAB — HEMOGLOBIN A1C
Est. average glucose Bld gHb Est-mCnc: 131 mg/dL
Hgb A1c MFr Bld: 6.2 % — ABNORMAL HIGH (ref 4.8–5.6)

## 2018-08-19 ENCOUNTER — Encounter: Payer: Self-pay | Admitting: Internal Medicine

## 2018-08-20 ENCOUNTER — Other Ambulatory Visit: Payer: Self-pay

## 2018-08-20 MED ORDER — SITAGLIPTIN PHOSPHATE 100 MG PO TABS: 100.0000 mg | ORAL_TABLET | Freq: Every day | ORAL | 1 refills | Status: DC

## 2018-09-05 IMAGING — DX DG ANKLE COMPLETE 3+V*R*
3 series · 3 of 3 positions shown · non-contrast
Comparison: Pre reduction radiographs earlier this day.

CLINICAL DATA: Fracture, postreduction.

EXAM:
RIGHT ANKLE - COMPLETE 3+ VIEW

[ankle ap]
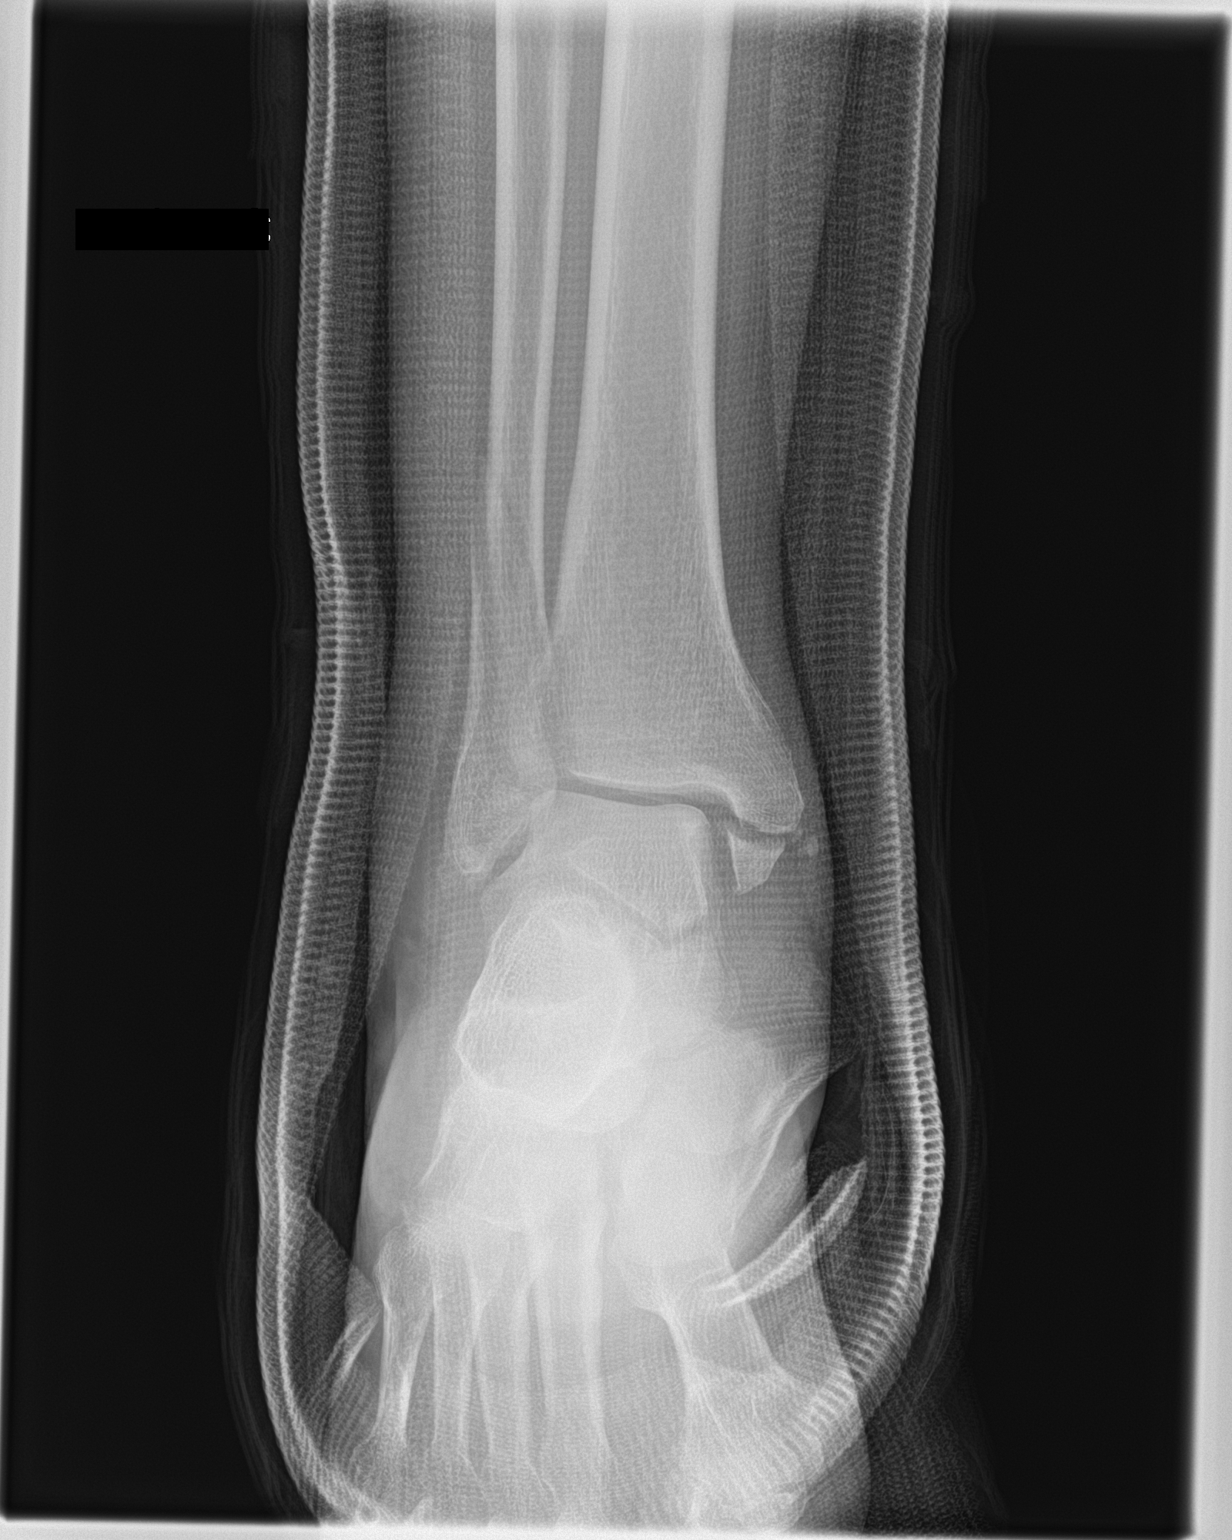

[ankle obl]
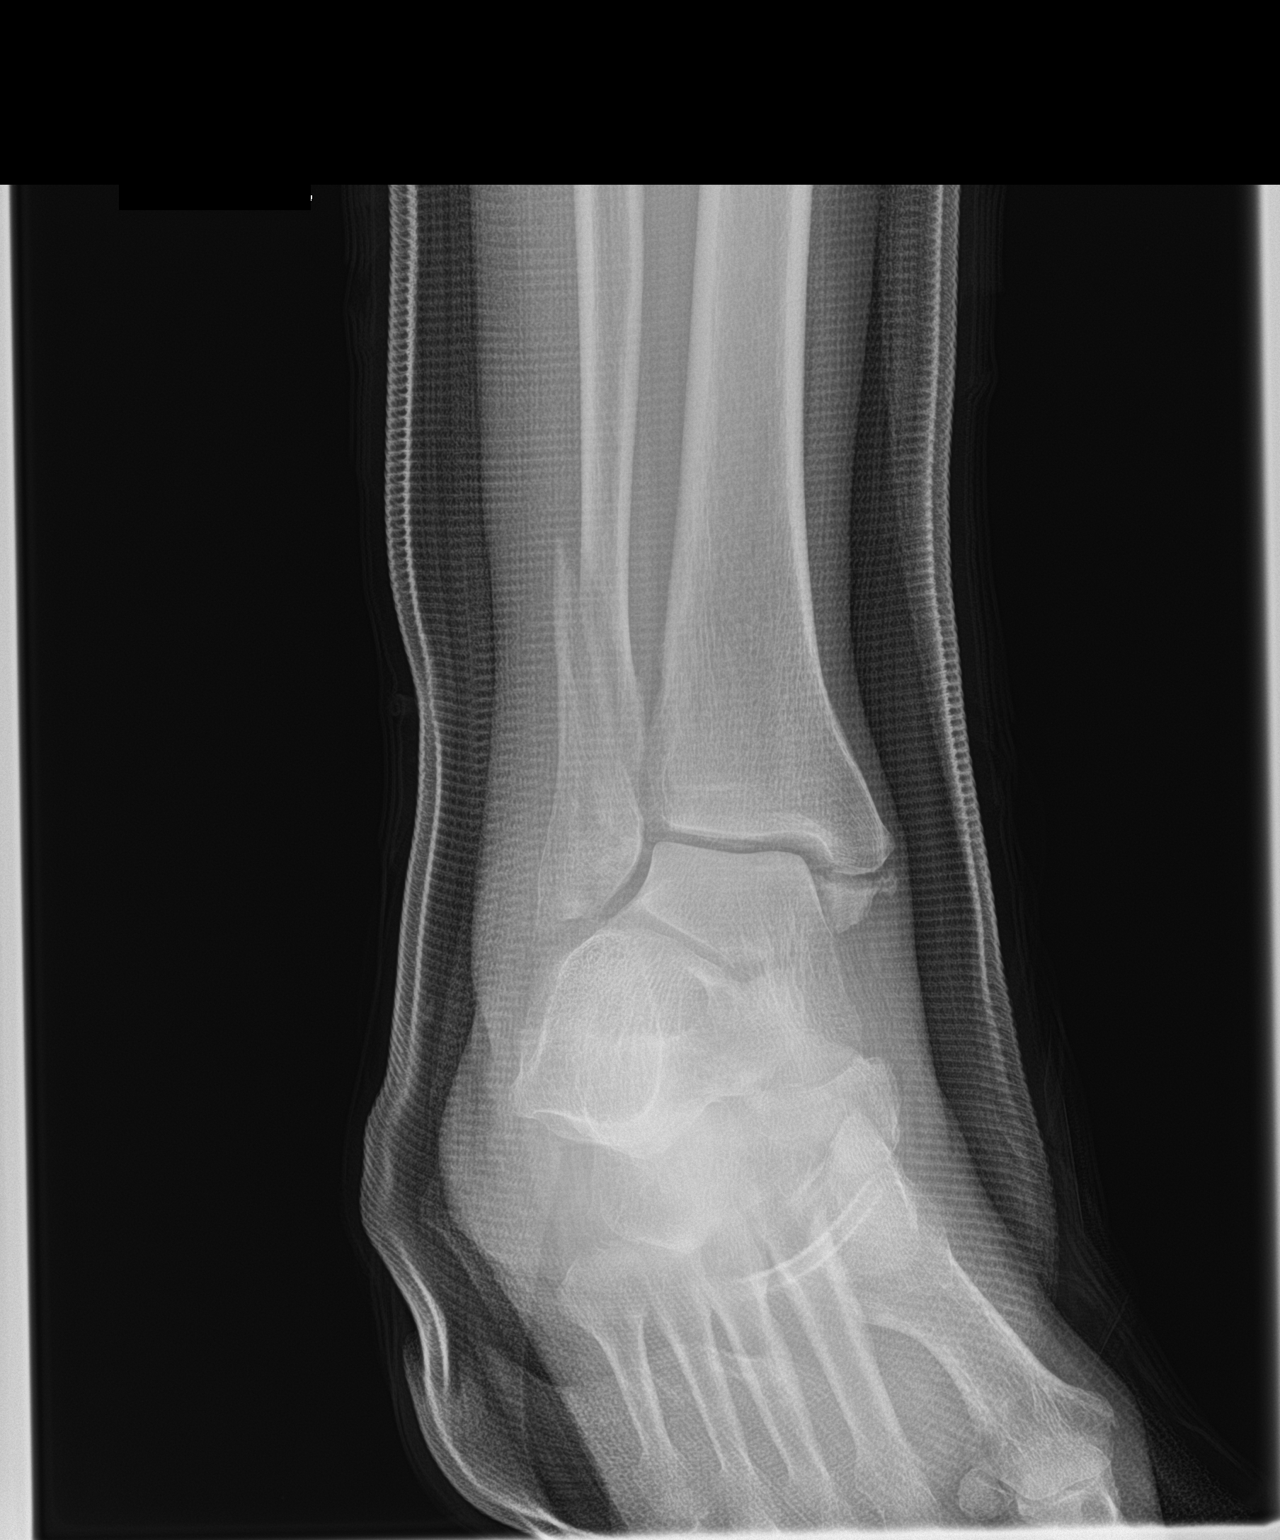

[ankle lat]
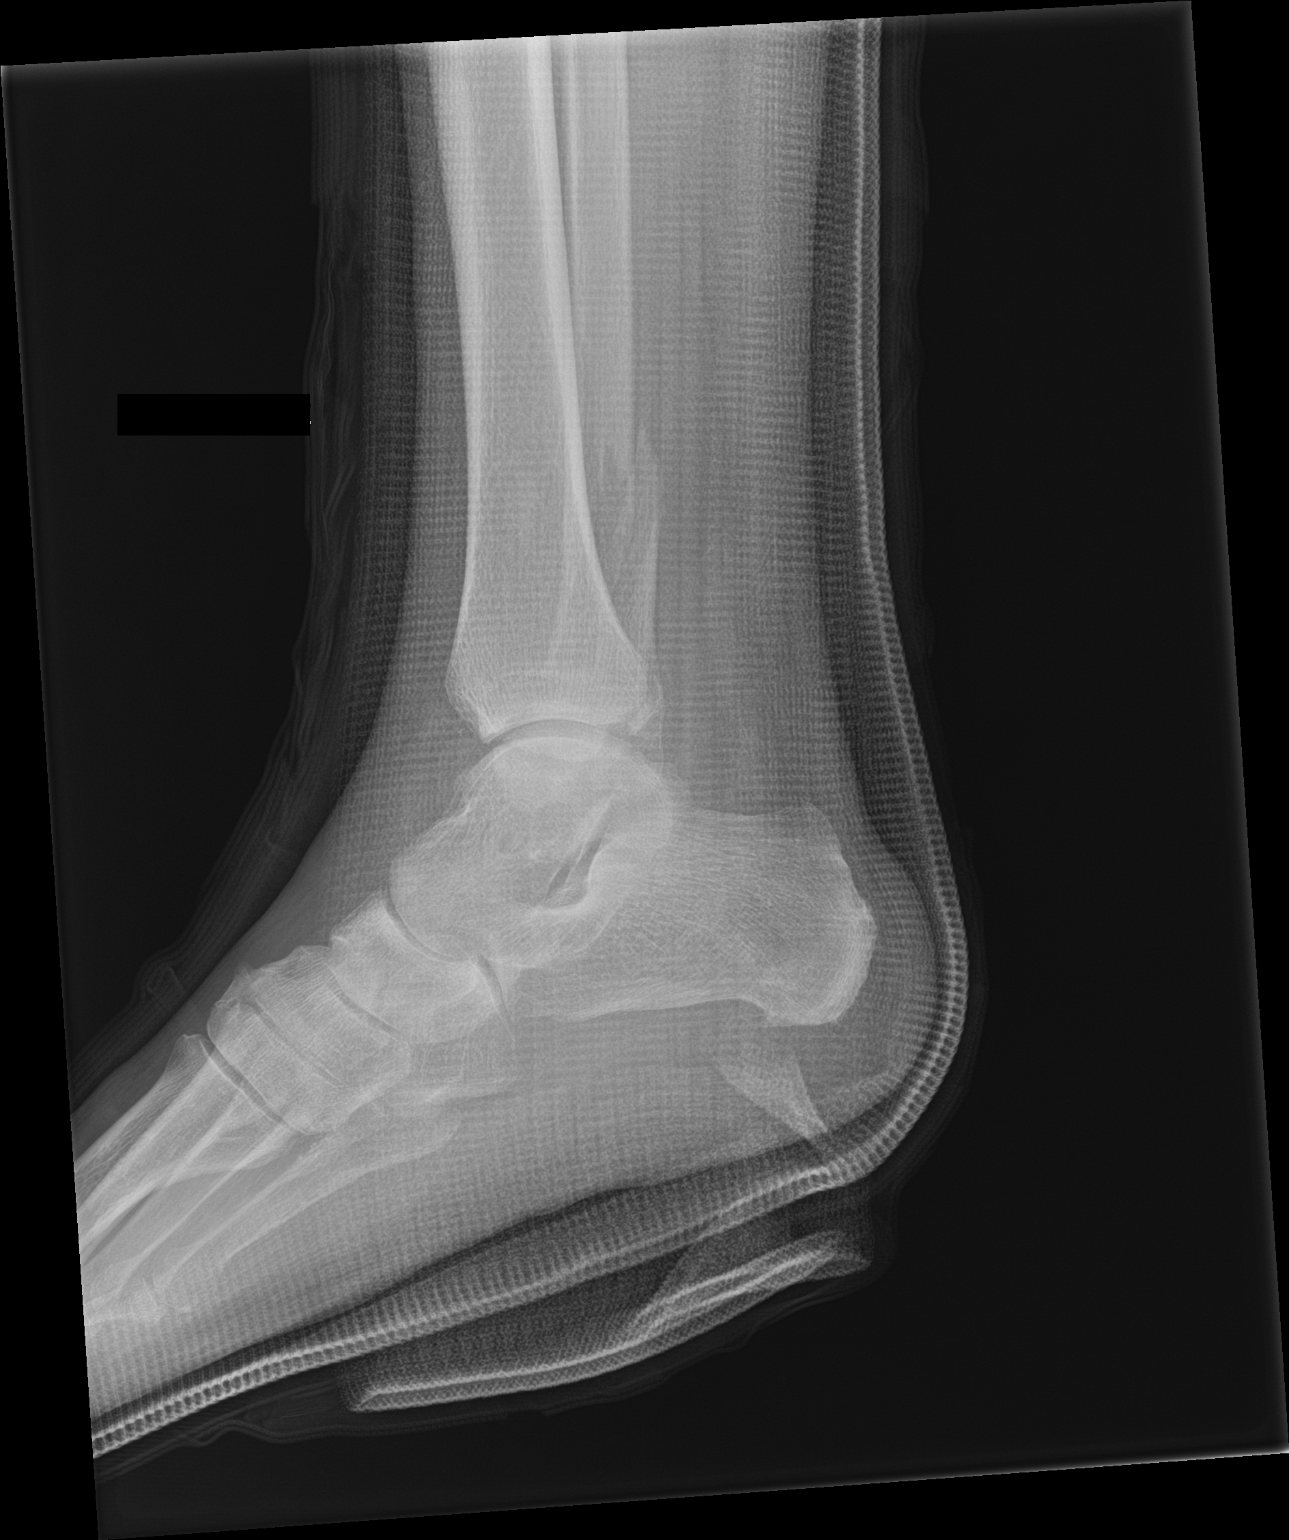

[3 of 3 positions shown; findings below may reference images not displayed]

FINDINGS: Improved alignment of trimalleolar fractures postreduction. Oblique
distal fibular fracture with improved alignment, mild residual
displacement. Transverse medial malleolar fracture with mild
residual displacement, minimal residual displacement posterior
malleolar fracture. Improved ankle mortise alignment with mild
residual widening of the medial clear space. Overlying cast material
in place limits osseous and soft tissue fine detail.
IMPRESSION: Improved distal tibia and fibular fractures postreduction.

## 2018-09-09 IMAGING — CR DG ANKLE 2V *R*
2 series · 2 of 2 positions shown · non-contrast
Comparison: Radiographs October 06, 2017.

CLINICAL DATA: Open reduction and internal fixation of right ankle.

EXAM:
RIGHT ANKLE - 2 VIEW
Radiation exposure index: 2.12 mGy.

[cont. (1 of 2)]
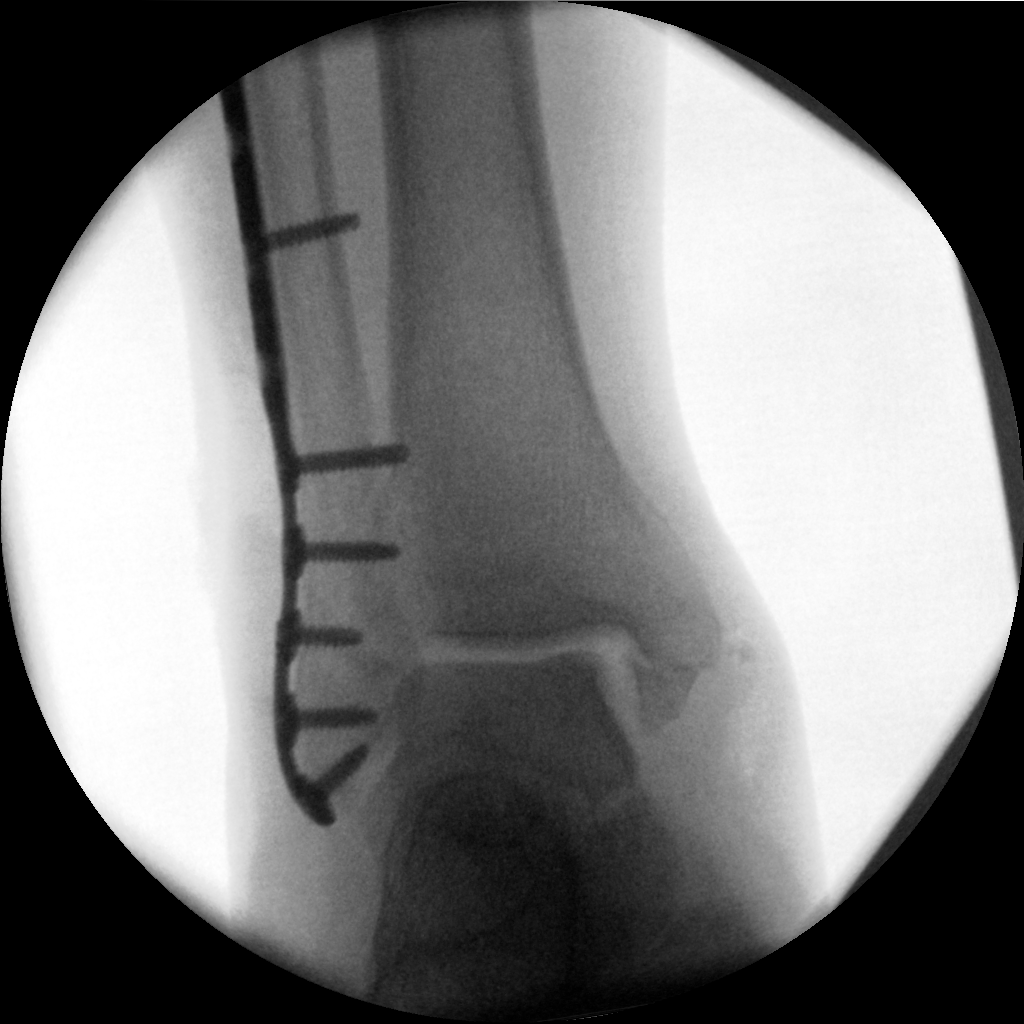

[cont. (2 of 2)]
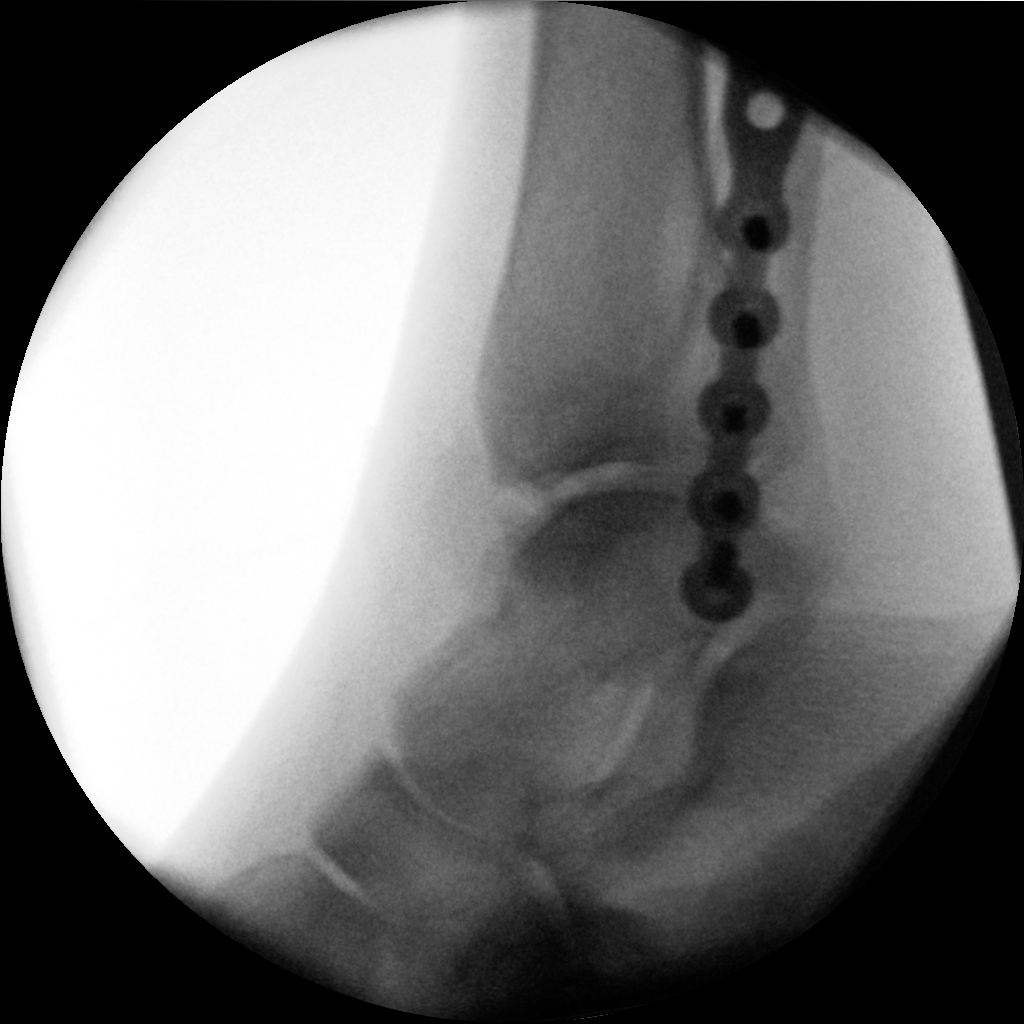

[2 of 2 positions shown; findings below may reference images not displayed]

FINDINGS: Two intraoperative fluoroscopic images demonstrate surgical internal
fixation of distal fibular fracture. Mildly displaced medial
malleolar fracture is again noted.
IMPRESSION: Status post surgical internal fixation of distal fibular fracture.

## 2018-09-16 ENCOUNTER — Ambulatory Visit (INDEPENDENT_AMBULATORY_CARE_PROVIDER_SITE_OTHER): Payer: Medicare Other

## 2018-09-16 ENCOUNTER — Other Ambulatory Visit: Payer: Self-pay

## 2018-09-16 VITALS — BP 143/79 | HR 67 | Ht 65.0 in | Wt 186.0 lb

## 2018-09-16 DIAGNOSIS — Z0000 Encounter for general adult medical examination without abnormal findings: Secondary | ICD-10-CM | POA: Diagnosis not present

## 2018-09-16 DIAGNOSIS — Z23 Encounter for immunization: Secondary | ICD-10-CM | POA: Diagnosis not present

## 2018-09-16 MED ORDER — PNEUMOCOCCAL 13-VAL CONJ VACC IM SUSP: 0.5000 mL | INTRAMUSCULAR | 0 refills | Status: AC

## 2018-09-16 NOTE — Patient Instructions (Signed)
Kelli Bond , Thank you for taking time to come for your Medicare Wellness Visit. I appreciate your ongoing commitment to your health goals. Please review the following plan we discussed and let me know if I can assist you in the future.   Screening recommendations/referrals: Colonoscopy: 03/2018 per patient Mammogram: 05/2018 Bone Density: 05/2018 Recommended yearly ophthalmology/optometry visit for glaucoma screening and checkup Recommended yearly dental visit for hygiene and checkup  Vaccinations: Influenza vaccine: 02/2018 Pneumococcal vaccine: 07/2014 Tdap vaccine: due Shingles vaccine: 07/2011    Advanced directives: Advance directive discussed with you today.  Conditions/risks identified: Obesity  Next appointment: 11/05/2018 at 11:30   Preventive Care 65 Years and Older, Female Preventive care refers to lifestyle choices and visits with your health care provider that can promote health and wellness. What does preventive care include?  A yearly physical exam. This is also called an annual well check.  Dental exams once or twice a year.  Routine eye exams. Ask your health care provider how often you should have your eyes checked.  Personal lifestyle choices, including:  Daily care of your teeth and gums.  Regular physical activity.  Eating a healthy diet.  Avoiding tobacco and drug use.  Limiting alcohol use.  Practicing safe sex.  Taking low-dose aspirin every day.  Taking vitamin and mineral supplements as recommended by your health care provider. What happens during an annual well check? The services and screenings done by your health care provider during your annual well check will depend on your age, overall health, lifestyle risk factors, and family history of disease. Counseling  Your health care provider may ask you questions about your:  Alcohol use.  Tobacco use.  Drug use.  Emotional well-being.  Home and relationship well-being.  Sexual  activity.  Eating habits.  History of falls.  Memory and ability to understand (cognition).  Work and work Astronomer.  Reproductive health. Screening  You may have the following tests or measurements:  Height, weight, and BMI.  Blood pressure.  Lipid and cholesterol levels. These may be checked every 5 years, or more frequently if you are over 58 years old.  Skin check.  Lung cancer screening. You may have this screening every year starting at age 32 if you have a 30-pack-year history of smoking and currently smoke or have quit within the past 15 years.  Fecal occult blood test (FOBT) of the stool. You may have this test every year starting at age 43.  Flexible sigmoidoscopy or colonoscopy. You may have a sigmoidoscopy every 5 years or a colonoscopy every 10 years starting at age 3.  Hepatitis C blood test.  Hepatitis B blood test.  Sexually transmitted disease (STD) testing.  Diabetes screening. This is done by checking your blood sugar (glucose) after you have not eaten for a while (fasting). You may have this done every 1-3 years.  Bone density scan. This is done to screen for osteoporosis. You may have this done starting at age 95.  Mammogram. This may be done every 1-2 years. Talk to your health care provider about how often you should have regular mammograms. Talk with your health care provider about your test results, treatment options, and if necessary, the need for more tests. Vaccines  Your health care provider may recommend certain vaccines, such as:  Influenza vaccine. This is recommended every year.  Tetanus, diphtheria, and acellular pertussis (Tdap, Td) vaccine. You may need a Td booster every 10 years.  Zoster vaccine. You may need this after  age 61.  Pneumococcal 13-valent conjugate (PCV13) vaccine. One dose is recommended after age 34.  Pneumococcal polysaccharide (PPSV23) vaccine. One dose is recommended after age 7. Talk to your health care  provider about which screenings and vaccines you need and how often you need them. This information is not intended to replace advice given to you by your health care provider. Make sure you discuss any questions you have with your health care provider. Document Released: 05/20/2015 Document Revised: 01/11/2016 Document Reviewed: 02/22/2015 Elsevier Interactive Patient Education  2017 [place] Prevention in the Home Falls can cause injuries. They can happen to people of all ages. There are many things you can do to make your home safe and to help prevent falls. What can I do on the outside of my home?  Regularly fix the edges of walkways and driveways and fix any cracks.  Remove anything that might make you trip as you walk through a door, such as a raised step or threshold.  Trim any bushes or trees on the path to your home.  Use bright outdoor lighting.  Clear any walking paths of anything that might make someone trip, such as rocks or tools.  Regularly check to see if handrails are loose or broken. Make sure that both sides of any steps have handrails.  Any raised decks and porches should have guardrails on the edges.  Have any leaves, snow, or ice cleared regularly.  Use sand or salt on walking paths during winter.  Clean up any spills in your garage right away. This includes oil or grease spills. What can I do in the bathroom?  Use night lights.  Install grab bars by the toilet and in the tub and shower. Do not use towel bars as grab bars.  Use non-skid mats or decals in the tub or shower.  If you need to sit down in the shower, use a plastic, non-slip stool.  Keep the floor dry. Clean up any water that spills on the floor as soon as it happens.  Remove soap buildup in the tub or shower regularly.  Attach bath mats securely with double-sided non-slip rug tape.  Do not have throw rugs and other things on the floor that can make you trip. What can I do in  the bedroom?  Use night lights.  Make sure that you have a light by your bed that is easy to reach.  Do not use any sheets or blankets that are too big for your bed. They should not hang down onto the floor.  Have a firm chair that has side arms. You can use this for support while you get dressed.  Do not have throw rugs and other things on the floor that can make you trip. What can I do in the kitchen?  Clean up any spills right away.  Avoid walking on wet floors.  Keep items that you use a lot in easy-to-reach places.  If you need to reach something above you, use a strong step stool that has a grab bar.  Keep electrical cords out of the way.  Do not use floor polish or wax that makes floors slippery. If you must use wax, use non-skid floor wax.  Do not have throw rugs and other things on the floor that can make you trip. What can I do with my stairs?  Do not leave any items on the stairs.  Make sure that there are handrails on both sides of the stairs  and use them. Fix handrails that are broken or loose. Make sure that handrails are as long as the stairways.  Check any carpeting to make sure that it is firmly attached to the stairs. Fix any carpet that is loose or worn.  Avoid having throw rugs at the top or bottom of the stairs. If you do have throw rugs, attach them to the floor with carpet tape.  Make sure that you have a light switch at the top of the stairs and the bottom of the stairs. If you do not have them, ask someone to add them for you. What else can I do to help prevent falls?  Wear shoes that:  Do not have high heels.  Have rubber bottoms.  Are comfortable and fit you well.  Are closed at the toe. Do not wear sandals.  If you use a stepladder:  Make sure that it is fully opened. Do not climb a closed stepladder.  Make sure that both sides of the stepladder are locked into place.  Ask someone to hold it for you, if possible.  Clearly mark and  make sure that you can see:  Any grab bars or handrails.  First and last steps.  Where the edge of each step is.  Use tools that help you move around (mobility aids) if they are needed. These include:  Canes.  Walkers.  Scooters.  Crutches.  Turn on the lights when you go into a dark area. Replace any light bulbs as soon as they burn out.  Set up your furniture so you have a clear path. Avoid moving your furniture around.  If any of your floors are uneven, fix them.  If there are any pets around you, be aware of where they are.  Review your medicines with your doctor. Some medicines can make you feel dizzy. This can increase your chance of falling. Ask your doctor what other things that you can do to help prevent falls. This information is not intended to replace advice given to you by your health care provider. Make sure you discuss any questions you have with your health care provider. Document Released: 02/17/2009 Document Revised: 09/29/2015 Document Reviewed: 05/28/2014 Elsevier Interactive Patient Education  2017 [employer].

## 2018-09-16 NOTE — Progress Notes (Signed)
Subjective:   Kelli Bond is a 68 y.o. female who presents for Medicare Annual (Subsequent) preventive examination.  This visit type was conducted due to national recommendations for restrictions regarding the COVID-19 Pandemic (e.g. social distancing). This format is felt to be most appropriate for this patient at this time. All issues noted in this document were discussed and addressed. No physical exam was performed (except for noted visual exam findings with Video Visits). This patient, Kelli Bond, has given permission to perform this visit via telephone. Attempted to perform virtually, but camera would not enable. Vital signs may be absent or patient reported.  Patient location:at home   Nurse location:  [facility] office     Review of Systems:  n/a Cardiac Risk Factors include: advanced age (>855men, 71>65 women);hypertension;dyslipidemia;obesity (BMI >30kg/m2);sedentary lifestyle     Objective:     Vitals: BP (!) 143/79 Comment: per patient  Pulse 67 Comment: per patient  Ht 5\' 5"  (1.651 m) Comment: per patient  Wt 186 lb (84.4 kg) Comment: per patient  BMI 30.95 kg/m   Body mass index is 30.95 kg/m.  Advanced Directives 09/16/2018 10/10/2017  Does Patient Have a Medical Advance Directive? No No    Tobacco Social History   Tobacco Use  Smoking Status Never Smoker  Smokeless Tobacco Never Used     Counseling given: Not Answered   Clinical Intake:  Pre-visit preparation completed: Yes  Pain : No/denies pain Pain Score: 0-No pain     Nutritional Status: BMI > 30  Obese Nutritional Risks: None Diabetes: Yes CBG done?: No Did pt. bring in CBG monitor from home?: No  How often do you need to have someone help you when you read instructions, pamphlets, or other written materials from your doctor or pharmacy?: 1 - Never What is the last grade level you completed in school?: master's degree  Interpreter Needed?: No  Information entered by :: NAllen LPN   Past Medical History:  Diagnosis Date  . Diabetes mellitus without complication (HCC)   . Essential hypertension, benign 12/28/2013  . Hypertension   . Pure hypercholesterolemia 12/28/2013  . Thyroid disease    Past Surgical History:  Procedure Laterality Date  . ABDOMINAL HYSTERECTOMY    . ORIF ANKLE FRACTURE Right 10/10/2017   Procedure: OPEN REDUCTION INTERNAL FIXATION (ORIF) ANKLE FRACTURE;  Surgeon: Kennedy BuckerMenz, Michael, MD;  Location: [facility] ORS;  Service: Orthopedics;  Laterality: Right;   Family History  Problem Relation Age of Onset  . Hypertension Father   . Kidney disease Father   . Bradycardia Mother        pacemaker  . Breast cancer Sister   . Kidney disease Brother    Social History   Socioeconomic History  . Marital status: Married    Spouse name: Not on file  . Number of children: Not on file  . Years of education: Not on file  . Highest education level: Not on file  Occupational History  . Not on file  Social Needs  . Financial resource strain: Not hard at all  . Food insecurity:    Worry: Never true    Inability: Never true  . Transportation needs:    Medical: No    Non-medical: No  Tobacco Use  . Smoking status: Never Smoker  . Smokeless tobacco: Never Used  Substance and Sexual Activity  . Alcohol use: Not Currently    Frequency: Never  . Drug use: Never  . Sexual activity: Yes  Lifestyle  .  Physical activity:    Days per week: Not on file    Minutes per session: Not on file  . Stress: Not on file  Relationships  . Social connections:    Talks on phone: Not on file    Gets together: Not on file    Attends religious service: Not on file    Active member of club or organization: Not on file    Attends meetings of clubs or organizations: Not on file    Relationship status: Not on file  Other Topics Concern  . Not on file  Social History Narrative  . Not on file    Outpatient Encounter Medications as of 09/16/2018  Medication Sig  . aspirin EC  81 MG tablet Take 81 mg by mouth daily.  . cetirizine (ZYRTEC) 10 MG tablet Take 10 mg by mouth daily as needed for allergies. prn  . cholecalciferol (VITAMIN D) 1000 UNITS tablet Take 1,000 Units by mouth daily.   Marland Kitchen docusate sodium (COLACE) 100 MG capsule Take by mouth.  . fluticasone (FLONASE ALLERGY RELIEF) 50 MCG/ACT nasal spray Place 2 sprays into both nostrils daily.  Marland Kitchen latanoprost (XALATAN) 0.005 % ophthalmic solution Place 1 drop into both eyes at bedtime.  . rosuvastatin (CRESTOR) 20 MG tablet Take 20 mg by mouth daily.  . sitaGLIPtin (JANUVIA) 100 MG tablet Take 1 tablet (100 mg total) by mouth daily.  Marland Kitchen SYNTHROID 112 MCG tablet TAKE 1 TABLET BY MOUTH EVERY DAY  . telmisartan-hydrochlorothiazide (MICARDIS HCT) 40-12.5 MG tablet Take 1 tablet by mouth daily.  . pneumococcal 13-valent conjugate vaccine (PREVNAR 13) SUSP injection Inject 0.5 mLs into the muscle tomorrow at 10 am for 1 dose.  . traMADol (ULTRAM) 50 MG tablet TAKE 1 TABLET BY MOUTH EVERY 6 HOURS AS NEEDED (Patient not taking: Reported on 09/16/2018)   No facility-administered encounter medications on file as of 09/16/2018.     Activities of Daily Living In your present state of health, do you have any difficulty performing the following activities: 09/16/2018 10/10/2017  Hearing? N N  Vision? Y N  Comment slight cataract -  Difficulty concentrating or making decisions? N Y  Walking or climbing stairs? N N  Dressing or bathing? N N  Doing errands, shopping? N -  Preparing Food and eating ? N -  Using the Toilet? N -  In the past six months, have you accidently leaked urine? N -  Do you have problems with loss of bowel control? N -  Managing your Medications? N -  Managing your Finances? N -  Housekeeping or managing your Housekeeping? N -  Some recent data might be hidden    Patient Care Team: Dorothyann Peng, MD as PCP - General (Internal Medicine)    Assessment:   This is a routine wellness examination for  Kelli Bond.  Exercise Activities and Dietary recommendations Current Exercise Habits: The patient does not participate in regular exercise at present  Goals    . Weight (lb) < 200 lb (90.7 kg)     Would like to lose 20 pounds       Fall Risk Fall Risk  09/16/2018 08/13/2018 05/14/2018 04/07/2018 02/04/2018  Falls in the past year? 0 1 0 1 No  Number falls in past yr: - 0 - 0 -  Injury with Fall? - 1 - 1 -  Risk for fall due to : Medication side effect - - - -   Is the patient's home free of loose throw rugs in  walkways, pet beds, electrical cords, etc?   yes      Grab bars in the bathroom? no      Handrails on the stairs?   yes      Adequate lighting?   yes  Timed Get Up and Go performed: n/a  Depression Screen PHQ 2/9 Scores 09/16/2018 08/13/2018 05/14/2018 04/07/2018  PHQ - 2 Score 0 0 0 0  PHQ- 9 Score 0 - - -     Cognitive Function     6CIT Screen 09/16/2018  What Year? 0 points  What month? 0 points  What time? 0 points  Count back from 20 0 points  Months in reverse 0 points  Repeat phrase 0 points  Total Score 0    Immunization History  Administered Date(s) Administered  . Influenza, High Dose Seasonal PF 02/04/2018  . Influenza,inj,Quad PF,6+ Mos 03/14/2017    Qualifies for Shingles Vaccine? yes  Screening Tests Health Maintenance  Topic Date Due  . FOOT EXAM  07/14/1960  . OPHTHALMOLOGY EXAM  07/14/1960  . TETANUS/TDAP  03/09/2015  . PNA vac Low Risk Adult (1 of 2 - PCV13) 07/14/2016  . INFLUENZA VACCINE  12/06/2018  . HEMOGLOBIN A1C  02/12/2019  . MAMMOGRAM  06/05/2020  . COLONOSCOPY  12/19/2022  . DEXA SCAN  Completed  . Hepatitis C Screening  Completed    Cancer Screenings: Lung: Low Dose CT Chest recommended if Age 38-80 years, 30 pack-year currently smoking OR have quit w/in 15years. Patient does not qualify. Breast:  Up to date on Mammogram? Yes   Up to date of Bone Density/Dexa? Yes Colorectal: up to date  Additional Screenings:  Hepatitis C  Screening: 10/18/2015     Plan:    Wants to lose 20 pounds. States had a colonoscopy this past November.   I have personally reviewed and noted the following in the patient's chart:   . Medical and social history . Use of alcohol, tobacco or illicit drugs  . Current medications and supplements . Functional ability and status . Nutritional status . Physical activity . Advanced directives . List of other physicians . Hospitalizations, surgeries, and ER visits in previous 12 months . Vitals . Screenings to include cognitive, depression, and falls . Referrals and appointments  In addition, I have reviewed and discussed with patient certain preventive protocols, quality metrics, and best practice recommendations. A written personalized care plan for preventive services as well as general preventive health recommendations were provided to patient.     Barb Merino, LPN  11/14/6267

## 2018-09-17 ENCOUNTER — Telehealth: Payer: Self-pay

## 2018-09-17 NOTE — Telephone Encounter (Signed)
I returned the pt's call and left a message that her samples of synthroid 125 mg is ready for pick up.

## 2018-09-18 ENCOUNTER — Other Ambulatory Visit: Payer: Self-pay | Admitting: Internal Medicine

## 2018-09-19 ENCOUNTER — Encounter: Payer: Self-pay | Admitting: Internal Medicine

## 2018-10-11 ENCOUNTER — Other Ambulatory Visit: Payer: Self-pay | Admitting: Internal Medicine

## 2018-10-15 ENCOUNTER — Other Ambulatory Visit: Payer: Self-pay | Admitting: Internal Medicine

## 2018-11-05 ENCOUNTER — Encounter: Payer: Medicare Other | Admitting: Internal Medicine

## 2018-11-05 ENCOUNTER — Ambulatory Visit: Payer: Medicare Other

## 2018-11-05 ENCOUNTER — Other Ambulatory Visit: Payer: Self-pay | Admitting: Internal Medicine

## 2018-11-13 ENCOUNTER — Ambulatory Visit: Payer: Medicare Other | Admitting: Internal Medicine

## 2018-12-11 ENCOUNTER — Other Ambulatory Visit: Payer: Self-pay

## 2018-12-11 ENCOUNTER — Encounter: Payer: Self-pay | Admitting: Internal Medicine

## 2018-12-11 ENCOUNTER — Ambulatory Visit (INDEPENDENT_AMBULATORY_CARE_PROVIDER_SITE_OTHER): Payer: Medicare Other | Admitting: Internal Medicine

## 2018-12-11 VITALS — BP 126/78 | HR 52 | Temp 98.4°F | Ht 64.0 in | Wt 182.0 lb

## 2018-12-11 DIAGNOSIS — Z0000 Encounter for general adult medical examination without abnormal findings: Secondary | ICD-10-CM

## 2018-12-11 DIAGNOSIS — M79641 Pain in right hand: Secondary | ICD-10-CM

## 2018-12-11 DIAGNOSIS — I129 Hypertensive chronic kidney disease with stage 1 through stage 4 chronic kidney disease, or unspecified chronic kidney disease: Secondary | ICD-10-CM

## 2018-12-11 DIAGNOSIS — R001 Bradycardia, unspecified: Secondary | ICD-10-CM

## 2018-12-11 DIAGNOSIS — E1122 Type 2 diabetes mellitus with diabetic chronic kidney disease: Secondary | ICD-10-CM

## 2018-12-11 DIAGNOSIS — E6609 Other obesity due to excess calories: Secondary | ICD-10-CM

## 2018-12-11 DIAGNOSIS — N182 Chronic kidney disease, stage 2 (mild): Secondary | ICD-10-CM

## 2018-12-11 DIAGNOSIS — E039 Hypothyroidism, unspecified: Secondary | ICD-10-CM

## 2018-12-11 DIAGNOSIS — Z6831 Body mass index (BMI) 31.0-31.9, adult: Secondary | ICD-10-CM

## 2018-12-11 LAB — POCT UA - MICROALBUMIN
Albumin/Creatinine Ratio, Urine, POC: <30
Creatinine, POC: 50 mg/dL
Microalbumin Ur, POC: 10 mg/L

## 2018-12-11 LAB — POCT URINALYSIS DIPSTICK
Bilirubin, UA: NEGATIVE
Glucose, UA: NEGATIVE
Ketones, UA: NEGATIVE
Leukocytes, UA: NEGATIVE
Nitrite, UA: NEGATIVE
Protein, UA: NEGATIVE
Spec Grav, UA: <=1.005 — AB (ref 1.010–1.025)
Urobilinogen, UA: 0.2 E.U./dL
pH, UA: 5.5 (ref 5.0–8.0)

## 2018-12-11 MED ORDER — TETANUS-DIPHTH-ACELL PERTUSSIS 5-2.5-18.5 LF-MCG/0.5 IM SUSP: 0.5000 mL | Freq: Once | INTRAMUSCULAR | 0 refills | Status: AC

## 2018-12-11 NOTE — Patient Instructions (Signed)
Health Maintenance, Female Adopting a healthy lifestyle and getting preventive care are important in promoting health and wellness. Ask your health care provider about:  The right schedule for you to have regular tests and exams.  Things you can do on your own to prevent diseases and keep yourself healthy. What should I know about diet, weight, and exercise? Eat a healthy diet   Eat a diet that includes plenty of vegetables, fruits, low-fat dairy products, and lean protein.  Do not eat a lot of foods that are high in solid fats, added sugars, or sodium. Maintain a healthy weight Body mass index (BMI) is used to identify weight problems. It estimates body fat based on height and weight. Your health care provider can help determine your BMI and help you achieve or maintain a healthy weight. Get regular exercise Get regular exercise. This is one of the most important things you can do for your health. Most adults should:  Exercise for at least 150 minutes each week. The exercise should increase your heart rate and make you sweat (moderate-intensity exercise).  Do strengthening exercises at least twice a week. This is in addition to the moderate-intensity exercise.  Spend less time sitting. Even light physical activity can be beneficial. Watch cholesterol and blood lipids Have your blood tested for lipids and cholesterol at 67 years of age, then have this test every 5 years. Have your cholesterol levels checked more often if:  Your lipid or cholesterol levels are high.  You are older than 67 years of age.  You are at high risk for heart disease. What should I know about cancer screening? Depending on your health history and family history, you may need to have cancer screening at various ages. This may include screening for:  Breast cancer.  Cervical cancer.  Colorectal cancer.  Skin cancer.  Lung cancer. What should I know about heart disease, diabetes, and high blood  pressure? Blood pressure and heart disease  High blood pressure causes heart disease and increases the risk of stroke. This is more likely to develop in people who have high blood pressure readings, are of African descent, or are overweight.  Have your blood pressure checked: ? Every 3-5 years if you are 18-39 years of age. ? Every year if you are 40 years old or older. Diabetes Have regular diabetes screenings. This checks your fasting blood sugar level. Have the screening done:  Once every three years after age 40 if you are at a normal weight and have a low risk for diabetes.  More often and at a younger age if you are overweight or have a high risk for diabetes. What should I know about preventing infection? Hepatitis B If you have a higher risk for hepatitis B, you should be screened for this virus. Talk with your health care provider to find out if you are at risk for hepatitis B infection. Hepatitis C Testing is recommended for:  Everyone born from 1945 through 1965.  Anyone with known risk factors for hepatitis C. Sexually transmitted infections (STIs)  Get screened for STIs, including gonorrhea and chlamydia, if: ? You are sexually active and are younger than 67 years of age. ? You are older than 67 years of age and your health care provider tells you that you are at risk for this type of infection. ? Your sexual activity has changed since you were last screened, and you are at increased risk for chlamydia or gonorrhea. Ask your health care provider if   you are at risk.  Ask your health care provider about whether you are at high risk for HIV. Your health care provider may recommend a prescription medicine to help prevent HIV infection. If you choose to take medicine to prevent HIV, you should first get tested for HIV. You should then be tested every 3 months for as long as you are taking the medicine. Pregnancy  If you are about to stop having your period (premenopausal) and  you may become pregnant, seek counseling before you get pregnant.  Take 400 to 800 micrograms (mcg) of folic acid every day if you become pregnant.  Ask for birth control (contraception) if you want to prevent pregnancy. Osteoporosis and menopause Osteoporosis is a disease in which the bones lose minerals and strength with aging. This can result in bone fractures. If you are 65 years old or older, or if you are at risk for osteoporosis and fractures, ask your health care provider if you should:  Be screened for bone loss.  Take a calcium or vitamin D supplement to lower your risk of fractures.  Be given hormone replacement therapy (HRT) to treat symptoms of menopause. Follow these instructions at home: Lifestyle  Do not use any products that contain nicotine or tobacco, such as cigarettes, e-cigarettes, and chewing tobacco. If you need help quitting, ask your health care provider.  Do not use street drugs.  Do not share needles.  Ask your health care provider for help if you need support or information about quitting drugs. Alcohol use  Do not drink alcohol if: ? Your health care provider tells you not to drink. ? You are pregnant, may be pregnant, or are planning to become pregnant.  If you drink alcohol: ? Limit how much you use to 0-1 drink a day. ? Limit intake if you are breastfeeding.  Be aware of how much alcohol is in your drink. In the [place] one drink equals one 12 oz bottle of beer (355 mL), one 5 oz glass of wine (148 mL), or one 1 oz glass of hard liquor (44 mL). General instructions  Schedule regular health, dental, and eye exams.  Stay current with your vaccines.  Tell your health care provider if: ? You often feel depressed. ? You have ever been abused or do not feel safe at home. Summary  Adopting a healthy lifestyle and getting preventive care are important in promoting health and wellness.  Follow your health care provider's instructions about healthy  diet, exercising, and getting tested or screened for diseases.  Follow your health care provider's instructions on monitoring your cholesterol and blood pressure. This information is not intended to replace advice given to you by your health care provider. Make sure you discuss any questions you have with your health care provider. Document Released: 11/06/2010 Document Revised: 04/16/2018 Document Reviewed: 04/16/2018 [employer]vier Patient Education  2020 [employer]  

## 2018-12-12 LAB — CBC
Hematocrit: 41.3 % (ref 34.0–46.6)
Hemoglobin: 13.4 g/dL (ref 11.1–15.9)
MCH: 26.4 pg — ABNORMAL LOW (ref 26.6–33.0)
MCHC: 32.4 g/dL (ref 31.5–35.7)
MCV: 82 fL (ref 79–97)
Platelets: 252 10*3/uL (ref 150–450)
RBC: 5.07 x10E6/uL (ref 3.77–5.28)
RDW: 14.2 % (ref 11.7–15.4)
WBC: 6.3 10*3/uL (ref 3.4–10.8)

## 2018-12-12 LAB — LIPID PANEL
Chol/HDL Ratio: 1.9 ratio (ref 0.0–4.4)
Cholesterol, Total: 114 mg/dL (ref 100–199)
HDL: 59 mg/dL (ref >39)
LDL Calculated: 45 mg/dL (ref 0–99)
Triglycerides: 48 mg/dL (ref 0–149)
VLDL Cholesterol Cal: 10 mg/dL (ref 5–40)

## 2018-12-12 LAB — CMP14+EGFR
ALT: 18 IU/L (ref 0–32)
AST: 24 IU/L (ref 0–40)
Albumin/Globulin Ratio: 1.9 (ref 1.2–2.2)
Albumin: 4.5 g/dL (ref 3.8–4.8)
Alkaline Phosphatase: 74 IU/L (ref 39–117)
BUN/Creatinine Ratio: 11 — ABNORMAL LOW (ref 12–28)
BUN: 12 mg/dL (ref 8–27)
Bilirubin Total: 0.3 mg/dL (ref 0.0–1.2)
CO2: 27 mmol/L (ref 20–29)
Calcium: 9.7 mg/dL (ref 8.7–10.3)
Chloride: 98 mmol/L (ref 96–106)
Creatinine, Ser: 1.13 mg/dL — ABNORMAL HIGH (ref 0.57–1.00)
GFR calc Af Amer: 58 mL/min/{1.73_m2} — ABNORMAL LOW (ref >59)
GFR calc non Af Amer: 50 mL/min/{1.73_m2} — ABNORMAL LOW (ref >59)
Globulin, Total: 2.4 g/dL (ref 1.5–4.5)
Glucose: 70 mg/dL (ref 65–99)
Potassium: 3.8 mmol/L (ref 3.5–5.2)
Sodium: 138 mmol/L (ref 134–144)
Total Protein: 6.9 g/dL (ref 6.0–8.5)

## 2018-12-12 LAB — HEMOGLOBIN A1C
Est. average glucose Bld gHb Est-mCnc: 126 mg/dL
Hgb A1c MFr Bld: 6 % — ABNORMAL HIGH (ref 4.8–5.6)

## 2018-12-12 LAB — TSH: TSH: 1.44 u[IU]/mL (ref 0.450–4.500)

## 2018-12-12 LAB — T4, FREE: Free T4: 1.34 ng/dL (ref 0.82–1.77)

## 2018-12-16 ENCOUNTER — Encounter: Payer: Self-pay | Admitting: Internal Medicine

## 2018-12-17 NOTE — Progress Notes (Signed)
Subjective:     Patient ID: Kelli Bond , female    DOB: Dec 15, 1950 , 68 y.o.   MRN: [id number]   Chief Complaint  Patient presents with  . Annual Exam  . Diabetes  . Hypertension    HPI  She is here today for a full physical examination. She has no specific concerns at this time.   Diabetes She presents for her follow-up diabetic visit. She has type 2 diabetes mellitus. Her disease course has been stable. There are no hypoglycemic associated symptoms. Pertinent negatives for diabetes include no blurred vision and no chest pain. There are no hypoglycemic complications. Risk factors for coronary artery disease include diabetes mellitus, dyslipidemia, hypertension, obesity, sedentary lifestyle and post-menopausal. She is following a diabetic diet. She never participates in exercise. An ACE inhibitor/angiotensin II receptor blocker is being taken. Eye exam is not current.  Hypertension This is a chronic problem. The current episode started more than 1 year ago. The problem has been gradually improving since onset. The problem is controlled. Pertinent negatives include no blurred vision, chest pain, palpitations or shortness of breath. Risk factors for coronary artery disease include diabetes mellitus, dyslipidemia, post-menopausal state and sedentary lifestyle. The current treatment provides moderate improvement.     Past Medical History:  Diagnosis Date  . Diabetes mellitus without complication ([place])   . Essential hypertension, benign 12/28/2013  . Hypertension   . Pure hypercholesterolemia 12/28/2013  . Thyroid disease      Family History  Problem Relation Age of Onset  . Hypertension Father   . Kidney disease Father   . Bradycardia Mother        pacemaker  . Breast cancer Sister   . Kidney disease Brother      Current Outpatient Medications:  .  aspirin EC 81 MG tablet, Take 81 mg by mouth daily., Disp: , Rfl:  .  cetirizine (ZYRTEC) 10 MG tablet, Take 10 mg by mouth  daily as needed for allergies. prn, Disp: , Rfl:  .  cholecalciferol (VITAMIN D) 1000 UNITS tablet, Take 1,000 Units by mouth daily. , Disp: , Rfl:  .  docusate sodium (COLACE) 100 MG capsule, Take by mouth., Disp: , Rfl:  .  fluticasone (FLONASE) 50 MCG/ACT nasal spray, USE 2 SPRAYS IN EACH NOSTRIL EVERY DAY, Disp: 16 g, Rfl: 2 .  JANUVIA 100 MG tablet, TAKE 1 TABLET BY MOUTH EVERY DAY, Disp: 30 tablet, Rfl: 1 .  latanoprost (XALATAN) 0.005 % ophthalmic solution, Place 1 drop into both eyes at bedtime., Disp: , Rfl:  .  rosuvastatin (CRESTOR) 20 MG tablet, TAKE 1 TABLET BY MOUTH EVERY DAY, Disp: 90 tablet, Rfl: 2 .  SYNTHROID 112 MCG tablet, TAKE 1 TABLET BY MOUTH EVERY DAY, Disp: 30 tablet, Rfl: 3 .  telmisartan-hydrochlorothiazide (MICARDIS HCT) 40-12.5 MG tablet, Take 1 tablet by mouth daily., Disp: , Rfl:    Allergies  Allergen Reactions  . Banana Other (See Comments)    Tingling in mouth.  . Sulfa Antibiotics Rash    The patient states she uses post menopausal status for birth control. Last LMP was No LMP recorded. Patient has had a hysterectomy.. Negative for Dysmenorrhea Negative for: breast discharge, breast lump(s), breast pain and breast self exam. Associated symptoms include abnormal vaginal bleeding. Pertinent negatives include abnormal bleeding (hematology), anxiety, decreased libido, depression, difficulty falling sleep, dyspareunia, history of infertility, nocturia, sexual dysfunction, sleep disturbances, urinary incontinence, urinary urgency, vaginal discharge and vaginal itching. Diet regular.The patient states her exercise level  is  sedentary.   . The patient's tobacco use is:  Social History   Tobacco Use  Smoking Status Never Smoker  Smokeless Tobacco Never Used  . She has been exposed to passive smoke. The patient's alcohol use is:  Social History   Substance and Sexual Activity  Alcohol Use Not Currently  . Frequency: Never    Review of Systems  Constitutional:  Negative.   HENT: Negative.   Eyes: Negative.  Negative for blurred vision.  Respiratory: Negative.  Negative for shortness of breath.   Cardiovascular: Negative.  Negative for chest pain and palpitations.  Endocrine: Negative.   Genitourinary: Negative.   Musculoskeletal: Positive for arthralgias.       She c/o r hand pain. There is an area on her palm that is tender. She denies trauma/fall.  Skin: Negative.   Allergic/Immunologic: Negative.   Neurological: Negative.   Hematological: Negative.   Psychiatric/Behavioral: Negative.      Today's Vitals   12/11/18 1458  BP: 126/78  Pulse: (!) 52  Temp: 98.4 F (36.9 C)  TempSrc: Oral  Weight: 182 lb (82.6 kg)  Height: '5\' 4"'$  (1.626 m)  PainSc: 2   PainLoc: Ankle   Body mass index is 31.24 kg/m.   Objective:  Physical Exam Vitals signs and nursing note reviewed.  Constitutional:      Appearance: Normal appearance.  HENT:     Head: Normocephalic and atraumatic.     Right Ear: Tympanic membrane, ear canal and external ear normal.     Left Ear: Tympanic membrane, ear canal and external ear normal.     Nose: Nose normal.     Mouth/Throat:     Mouth: Mucous membranes are moist.     Pharynx: Oropharynx is clear.  Eyes:     Extraocular Movements: Extraocular movements intact.     Conjunctiva/sclera: Conjunctivae normal.     Pupils: Pupils are equal, round, and reactive to light.  Neck:     Musculoskeletal: Normal range of motion and neck supple.  Cardiovascular:     Rate and Rhythm: Normal rate and regular rhythm.     Pulses: Normal pulses.          Dorsalis pedis pulses are 2+ on the right side and 2+ on the left side.       Posterior tibial pulses are 2+ on the right side and 2+ on the left side.     Heart sounds: Normal heart sounds.  Pulmonary:     Effort: Pulmonary effort is normal.     Breath sounds: Normal breath sounds.  Chest:     Breasts: Tanner Score is 5.        Right: Normal. No swelling, bleeding,  inverted nipple, mass, nipple discharge or skin change.        Left: Normal. No swelling, bleeding, inverted nipple, mass, nipple discharge or skin change.  Abdominal:     General: Abdomen is flat. Bowel sounds are normal.     Palpations: Abdomen is soft.  Genitourinary:    Comments: deferred Musculoskeletal: Normal range of motion.     Comments: Area of soft tissue   Feet:     Right foot:     Protective Sensation: 5 sites tested. 5 sites sensed.     Skin integrity: Skin integrity normal.     Toenail Condition: Right toenails are normal.     Left foot:     Protective Sensation: 5 sites tested. 5 sites sensed.     Skin  integrity: Skin integrity normal.     Toenail Condition: Left toenails are normal.  Skin:    General: Skin is warm and dry.  Neurological:     General: No focal deficit present.     Mental Status: She is alert and oriented to person, place, and time.  Psychiatric:        Mood and Affect: Mood normal.        Behavior: Behavior normal.         Assessment And Plan:     1. Routine general medical examination at health care facility  A full exam was performed.  Importance of monthly self breast exams was discussed with the patient. PATIENT HAS BEEN ADVISED TO GET 30-45 MINUTES REGULAR EXERCISE NO LESS THAN FOUR TO FIVE DAYS PER WEEK - BOTH WEIGHTBEARING EXERCISES AND AEROBIC ARE RECOMMENDED.  SHE IS ADVISED TO FOLLOW A HEALTHY DIET WITH AT LEAST SIX FRUITS/VEGGIES PER DAY, DECREASE INTAKE OF RED MEAT, AND TO INCREASE FISH INTAKE TO TWO DAYS PER WEEK.  MEATS/FISH SHOULD NOT BE FRIED, BAKED OR BROILED IS PREFERABLE.  I SUGGEST WEARING SPF 50 SUNSCREEN ON EXPOSED PARTS AND ESPECIALLY WHEN IN THE DIRECT SUNLIGHT FOR AN EXTENDED PERIOD OF TIME.  PLEASE AVOID FAST FOOD RESTAURANTS AND INCREASE YOUR WATER INTAKE.   2. Type 2 diabetes mellitus with stage 2 chronic kidney disease, without long-term current use of insulin ([place])  Diabetic foot exam was performed.  I DISCUSSED  WITH THE PATIENT AT LENGTH REGARDING THE GOALS OF GLYCEMIC CONTROL AND POSSIBLE LONG-TERM COMPLICATIONS.  I  ALSO STRESSED THE IMPORTANCE OF COMPLIANCE WITH HOME GLUCOSE MONITORING, DIETARY RESTRICTIONS INCLUDING AVOIDANCE OF SUGARY DRINKS/PROCESSED FOODS,  ALONG WITH REGULAR EXERCISE.  I  ALSO STRESSED THE IMPORTANCE OF ANNUAL EYE EXAMS, SELF FOOT CARE AND COMPLIANCE WITH OFFICE VISITS.  - CMP14+EGFR - CBC - Lipid panel - Hemoglobin A1c - POCT Urinalysis Dipstick (81002) - POCT UA - Microalbumin  3. Hypertensive nephropathy  Chronic, well controlled. She will continue with current meds. She is encouraged to avoid adding salt to her foods. EKG performed, sinus bradycardia.   - EKG 12-Lead  4. Primary hypothyroidism  I will check thyroid panel and adjust meds as needed.  - TSH - T4, Free  5. Right hand pain  I will refer her to hand specialist for further evaluation. There are areas of tenderness to palpation.   - Ambulatory referral to Hand Surgery  6. Bradycardia  Chronic. HR 46 on EKG today. She is not symptomatic. Pt does have h/o hypothyroidism; however, heart rate previously in upper 50s and 60s. She is agreeable to cardiology evaluation.   - Ambulatory referral to Cardiology  7. Class 1 obesity due to excess calories with serious comorbidity and body mass index (BMI) of 31.0 to 31.9 in adult  Importance of achieving optimal weight to decrease risk of cardiovascular disease and cancers was discussed with the patient in full detail. Importance of regular exercise was discussed with the patient.  She is encouraged to start slowly - start with 10 minutes twice daily at least three to four days per week and to gradually build to 30 minutes five days weekly. She was given tips to incorporate more activity into her daily routine - take stairs when possible, park farther away from grocery stores, etc.    [name], MD    THE PATIENT IS ENCOURAGED TO PRACTICE SOCIAL  DISTANCING DUE TO THE COVID-19 PANDEMIC.

## 2018-12-18 ENCOUNTER — Encounter: Payer: Self-pay | Admitting: Internal Medicine

## 2019-02-03 ENCOUNTER — Encounter: Payer: Self-pay | Admitting: Internal Medicine

## 2019-02-04 LAB — HM DIABETES EYE EXAM

## 2019-02-11 ENCOUNTER — Encounter: Payer: Self-pay | Admitting: Interventional Cardiology

## 2019-02-11 ENCOUNTER — Ambulatory Visit (INDEPENDENT_AMBULATORY_CARE_PROVIDER_SITE_OTHER): Payer: Medicare Other | Admitting: Interventional Cardiology

## 2019-02-11 ENCOUNTER — Other Ambulatory Visit: Payer: Self-pay

## 2019-02-11 VITALS — BP 130/64 | HR 68 | Ht 64.0 in | Wt 182.0 lb

## 2019-02-11 DIAGNOSIS — Z794 Long term (current) use of insulin: Secondary | ICD-10-CM

## 2019-02-11 DIAGNOSIS — E782 Mixed hyperlipidemia: Secondary | ICD-10-CM

## 2019-02-11 DIAGNOSIS — R001 Bradycardia, unspecified: Secondary | ICD-10-CM | POA: Diagnosis not present

## 2019-02-11 DIAGNOSIS — E119 Type 2 diabetes mellitus without complications: Secondary | ICD-10-CM

## 2019-02-11 DIAGNOSIS — I10 Essential (primary) hypertension: Secondary | ICD-10-CM | POA: Diagnosis not present

## 2019-02-11 NOTE — Progress Notes (Signed)
Cardiology Office Note   Date:  02/11/2019   ID:  Kelli Bond, Kelli Bond [birth year], MRN [id number]  PCP:  Glendale Chard, MD    No chief complaint on file.  bradycardia  Wt Readings from Last 3 Encounters:  02/11/19 182 lb (82.6 kg)  12/11/18 182 lb (82.6 kg)  09/16/18 186 lb (84.4 kg)       History of Present Illness: Kelli Bond is a 68 y.o. female who is being seen today for the evaluation of bradycardia at the request of Glendale Chard, MD.   She had a clean cath many years ago, with Dr. Rex Kras. 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    She saw Dr. Baird Cancer and it was noted: "Chronic. HR 46 on EKG today. She is not symptomatic. Pt does have h/o hypothyroidism; however, heart rate previously in upper 50s and 60s. She is agreeable to cardiology evaluation."   - Ambulatory referral to Cardiology  1. I saw her back in 2015 and it was decided: "Bradycardia:  Temporary.  Heart rate now in the normal range. No symptoms associated with the bradycardia. No further cardiac workup needed. No signs or symptoms of ischemia. No signs or symptoms of heart failure. Prior nuclear stress test in 2012 was normal. 2. F/u prn. HTN: Controlled."  She remains without sx.    Denies : Chest pain. Dizziness. Leg edema. Nitroglycerin use. Orthopnea. Palpitations. Paroxysmal nocturnal dyspnea. Shortness of breath. Syncope.   BP has been controlled.   She has not been exercising. Walking limited after broken ankle last year.    Past Medical History:  Diagnosis Date  . Diabetes mellitus without complication ([place])   . Essential hypertension, benign 12/28/2013  . Hypertension   . Pure hypercholesterolemia 12/28/2013  . Thyroid disease     Past Surgical History:  Procedure Laterality Date  . ABDOMINAL HYSTERECTOMY    . ORIF ANKLE FRACTURE Right 10/10/2017   Procedure: OPEN REDUCTION INTERNAL FIXATION (ORIF) ANKLE FRACTURE;  Surgeon: Hessie Knows, MD;  Location: [facility] ORS;   Service: Orthopedics;  Laterality: Right;     Current Outpatient Medications  Medication Sig Dispense Refill  . aspirin EC 81 MG tablet Take 81 mg by mouth daily.    . cetirizine (ZYRTEC) 10 MG tablet Take 10 mg by mouth daily as needed for allergies. prn    . cholecalciferol (VITAMIN D) 1000 UNITS tablet Take 1,000 Units by mouth daily.     Marland Kitchen docusate sodium (COLACE) 100 MG capsule Take by mouth.    . fluticasone (FLONASE) 50 MCG/ACT nasal spray USE 2 SPRAYS IN EACH NOSTRIL EVERY DAY 16 g 2  . JANUVIA 100 MG tablet TAKE 1 TABLET BY MOUTH EVERY DAY 30 tablet 1  . latanoprost (XALATAN) 0.005 % ophthalmic solution Place 1 drop into both eyes at bedtime.    . rosuvastatin (CRESTOR) 20 MG tablet TAKE 1 TABLET BY MOUTH EVERY DAY 90 tablet 2  . SYNTHROID 112 MCG tablet TAKE 1 TABLET BY MOUTH EVERY DAY 30 tablet 3  . telmisartan-hydrochlorothiazide (MICARDIS HCT) 40-12.5 MG tablet Take 1 tablet by mouth daily.     No current facility-administered medications for this visit.     Allergies:   Banana and Sulfa antibiotics    Social History:  The patient  reports that she has never smoked. She has never used smokeless tobacco. She reports previous alcohol use. She reports that she does not use drugs.   Family History:  The patient's family  history includes Bradycardia in her mother; Breast cancer in her sister; Hypertension in her father; Kidney disease in her brother and father.    ROS:  Please see the history of present illness.   Otherwise, review of systems are positive for slow HR.   All other systems are reviewed and negative.    PHYSICAL EXAM: VS:  BP 130/64   Pulse 68   Ht 5\' 4"  (1.626 m)   Wt 182 lb (82.6 kg)   SpO2 94%   BMI 31.24 kg/m  , BMI Body mass index is 31.24 kg/m. GEN: Well nourished, well developed, in no acute distress  HEENT: normal  Neck: no JVD, carotid bruits, or masses Cardiac: RRR; no murmurs, rubs, or gallops,no edema  Respiratory:  clear to auscultation  bilaterally, normal work of breathing GI: soft, nontender, nondistended, + BS MS: no deformity or atrophy  Skin: warm and dry, no rash Neuro:  Strength and sensation are intact Psych: euthymic mood, full affect   EKG:   The ekg ordered in 12/2018 demonstrates sinus bradycardia   Recent Labs: 12/11/2018: ALT 18; BUN 12; Creatinine, Ser 1.13; Hemoglobin 13.4; Platelets 252; Potassium 3.8; Sodium 138; TSH 1.440   Lipid Panel    Component Value Date/Time   CHOL 114 12/11/2018 1548   TRIG 48 12/11/2018 1548   HDL 59 12/11/2018 1548   CHOLHDL 1.9 12/11/2018 1548   LDLCALC 45 12/11/2018 1548     Other studies Reviewed: Additional studies/ records that were reviewed today with results demonstrating: labs reviewed. LDL 45   ASSESSMENT AND PLAN:  1.  Bradycardia: Continues to be asymptomatic.  HR seems to increase appropriately.  No further testing needed.  SHe will let 02/10/2019 know if she has lightheadedness or syncope.   2. Hyperlipidemia: Continue Crestor, LDL 45. 3. DM: A1C 6.0 4. HTN: The current medical regimen is effective;  continue present plan and medications.    Current medicines are reviewed at length with the patient today.  The patient concerns regarding her medicines were addressed.  The following changes have been made:  No change  Labs/ tests ordered today include:  No orders of the defined types were placed in this encounter.   Recommend 150 minutes/week of aerobic exercise Low fat, low carb, high fiber diet recommended  Disposition:   FU as needed   Signed, [place], MD  02/11/2019 11:45 AM    [facility] [address] [place], [place], [place]  [place] Phone: [phone]; Fax: [phone]

## 2019-02-11 NOTE — Patient Instructions (Signed)
Medication Instructions:  Your physician recommends that you continue on your current medications as directed. Please refer to the Current Medication list given to you today.  If you need a refill on your cardiac medications before your next appointment, please call your pharmacy.   Lab work: None Ordered  If you have labs (blood work) drawn today and your tests are completely normal, you will receive your results only by: . MyChart Message (if you have MyChart) OR . A paper copy in the mail If you have any lab test that is abnormal or we need to change your treatment, we will call you to review the results.  Testing/Procedures: None ordered  Follow-Up: . AS NEEDED  Any Other Special Instructions Will Be Listed Below (If Applicable).    

## 2019-02-19 ENCOUNTER — Other Ambulatory Visit: Payer: Self-pay | Admitting: Internal Medicine

## 2019-03-19 ENCOUNTER — Other Ambulatory Visit: Payer: Self-pay | Admitting: Internal Medicine

## 2019-03-27 ENCOUNTER — Other Ambulatory Visit: Payer: Self-pay | Admitting: Internal Medicine

## 2019-04-10 ENCOUNTER — Encounter: Payer: Self-pay | Admitting: Internal Medicine

## 2019-04-15 ENCOUNTER — Encounter: Payer: Self-pay | Admitting: Internal Medicine

## 2019-04-15 ENCOUNTER — Other Ambulatory Visit: Payer: Self-pay

## 2019-04-15 ENCOUNTER — Ambulatory Visit (INDEPENDENT_AMBULATORY_CARE_PROVIDER_SITE_OTHER): Payer: Medicare Other | Admitting: Internal Medicine

## 2019-04-15 VITALS — BP 120/70 | HR 67 | Temp 97.9°F | Ht 63.8 in | Wt 185.4 lb

## 2019-04-15 DIAGNOSIS — E1122 Type 2 diabetes mellitus with diabetic chronic kidney disease: Secondary | ICD-10-CM

## 2019-04-15 DIAGNOSIS — E6609 Other obesity due to excess calories: Secondary | ICD-10-CM

## 2019-04-15 DIAGNOSIS — Z6832 Body mass index (BMI) 32.0-32.9, adult: Secondary | ICD-10-CM

## 2019-04-15 DIAGNOSIS — N182 Chronic kidney disease, stage 2 (mild): Secondary | ICD-10-CM

## 2019-04-15 DIAGNOSIS — I129 Hypertensive chronic kidney disease with stage 1 through stage 4 chronic kidney disease, or unspecified chronic kidney disease: Secondary | ICD-10-CM | POA: Diagnosis not present

## 2019-04-15 DIAGNOSIS — E039 Hypothyroidism, unspecified: Secondary | ICD-10-CM | POA: Diagnosis not present

## 2019-04-15 NOTE — Progress Notes (Signed)
This visit occurred during the SARS-CoV-2 public health emergency.  Safety protocols were in place, including screening questions prior to the visit, additional usage of staff PPE, and extensive cleaning of exam room while observing appropriate contact time as indicated for disinfecting solutions.  Subjective:     Patient ID: Kelli Bond , female    DOB: Sep 22, 1950 , 68 y.o.   MRN: [id number]   Chief Complaint  Patient presents with  . Diabetes  . Hypertension    HPI  Diabetes She presents for her follow-up diabetic visit. She has type 2 diabetes mellitus. Her disease course has been stable. There are no hypoglycemic associated symptoms. Pertinent negatives for diabetes include no blurred vision and no chest pain. There are no hypoglycemic complications. Risk factors for coronary artery disease include diabetes mellitus, dyslipidemia, hypertension, obesity, sedentary lifestyle and post-menopausal.  Hypertension This is a chronic problem. The current episode started more than 1 year ago. The problem has been gradually improving since onset. The problem is controlled. Pertinent negatives include no blurred vision, chest pain, palpitations or shortness of breath. Risk factors for coronary artery disease include diabetes mellitus, dyslipidemia, post-menopausal state and sedentary lifestyle.     Past Medical History:  Diagnosis Date  . Diabetes mellitus without complication ([place])   . Essential hypertension, benign 12/28/2013  . Hypertension   . Pure hypercholesterolemia 12/28/2013  . Thyroid disease      Family History  Problem Relation Age of Onset  . Hypertension Father   . Kidney disease Father   . Bradycardia Mother        pacemaker  . Breast cancer Sister   . Kidney disease Brother      Current Outpatient Medications:  .  aspirin EC 81 MG tablet, Take 81 mg by mouth daily., Disp: , Rfl:  .  cetirizine (ZYRTEC) 10 MG tablet, Take 10 mg by mouth daily as needed for  allergies. prn, Disp: , Rfl:  .  cholecalciferol (VITAMIN D) 1000 UNITS tablet, Take 1,000 Units by mouth daily. , Disp: , Rfl:  .  docusate sodium (COLACE) 100 MG capsule, Take by mouth., Disp: , Rfl:  .  fluticasone (FLONASE) 50 MCG/ACT nasal spray, USE 2 SPRAYS IN EACH NOSTRIL EVERY DAY, Disp: 16 g, Rfl: 2 .  JANUVIA 100 MG tablet, TAKE 1 TABLET BY MOUTH EVERY DAY, Disp: 30 tablet, Rfl: 1 .  latanoprost (XALATAN) 0.005 % ophthalmic solution, Place 1 drop into both eyes at bedtime., Disp: , Rfl:  .  rosuvastatin (CRESTOR) 20 MG tablet, TAKE 1 TABLET BY MOUTH EVERY DAY, Disp: 90 tablet, Rfl: 2 .  SYNTHROID 112 MCG tablet, TAKE 1 TABLET BY MOUTH EVERY DAY, Disp: 30 tablet, Rfl: 3 .  telmisartan-hydrochlorothiazide (MICARDIS HCT) 40-12.5 MG tablet, TAKE 1 TABLET BY MOUTH EVERY DAY, Disp: 90 tablet, Rfl: 2   Allergies  Allergen Reactions  . Banana Other (See Comments)    Tingling in mouth.  . Sulfa Antibiotics Rash     Review of Systems  Constitutional: Negative.   Eyes: Negative for blurred vision.  Respiratory: Negative.  Negative for shortness of breath.   Cardiovascular: Negative.  Negative for chest pain and palpitations.  Gastrointestinal: Negative.   Neurological: Negative.   Psychiatric/Behavioral: Negative.      Today's Vitals   04/15/19 1139  BP: 120/70  Pulse: 67  Temp: 97.9 F (36.6 C)  TempSrc: Oral  Weight: 185 lb 6.4 oz (84.1 kg)  Height: 5' 3.8" (1.621 m)  PainSc: 0-No pain  Body mass index is 32.02 kg/m.   Objective:  Physical Exam Vitals signs and nursing note reviewed.  Constitutional:      Appearance: Normal appearance.  HENT:     Head: Normocephalic and atraumatic.  Cardiovascular:     Rate and Rhythm: Normal rate and regular rhythm.     Heart sounds: Normal heart sounds.  Pulmonary:     Effort: Pulmonary effort is normal.     Breath sounds: Normal breath sounds.  Skin:    General: Skin is warm.  Neurological:     General: No focal deficit  present.     Mental Status: She is alert.  Psychiatric:        Mood and Affect: Mood normal.        Behavior: Behavior normal.         Assessment And Plan:     1. Type 2 diabetes mellitus with stage 2 chronic kidney disease, without long-term current use of insulin ([facility])  Chronic, this has been stable. I will check labs as listed below. Importance of regular exercise was discussed with the patient. She will rto in four months for re-evaluation.    - BMP8+EGFR - Hemoglobin A1c  2. Hypertensive nephropathy  Chronic, well controlled. She will continue with current meds. She is encouraged to avoid adding salt to her foods.   3. Primary hypothyroidism  Her previous TSH results were reviewed in full detail. She will continue with current meds. I will check TSh at her next visit.   4. Class 1 obesity due to excess calories with serious comorbidity and body mass index (BMI) of 32.0 to 32.9 in adult  Importance of achieving optimal weight to decrease risk of cardiovascular disease and cancers was discussed with the patient in full detail. She is encouraged to start slowly - start with 10 minutes twice daily at least three to four days per week and to gradually build to 30 minutes five days weekly. She was given tips to incorporate more activity into her daily routine - take stairs when possible, park farther away from grocery stores, etc.    [name], MD    THE PATIENT IS ENCOURAGED TO PRACTICE SOCIAL DISTANCING DUE TO THE COVID-19 PANDEMIC.

## 2019-04-15 NOTE — Patient Instructions (Signed)

## 2019-04-16 LAB — BMP8+EGFR
BUN/Creatinine Ratio: 11 — ABNORMAL LOW (ref 12–28)
BUN: 12 mg/dL (ref 8–27)
CO2: 25 mmol/L (ref 20–29)
Calcium: 9.6 mg/dL (ref 8.7–10.3)
Chloride: 101 mmol/L (ref 96–106)
Creatinine, Ser: 1.07 mg/dL — ABNORMAL HIGH (ref 0.57–1.00)
GFR calc Af Amer: 62 mL/min/{1.73_m2} (ref >59)
GFR calc non Af Amer: 54 mL/min/{1.73_m2} — ABNORMAL LOW (ref >59)
Glucose: 84 mg/dL (ref 65–99)
Potassium: 4.4 mmol/L (ref 3.5–5.2)
Sodium: 141 mmol/L (ref 134–144)

## 2019-04-16 LAB — HEMOGLOBIN A1C
Est. average glucose Bld gHb Est-mCnc: 128 mg/dL
Hgb A1c MFr Bld: 6.1 % — ABNORMAL HIGH (ref 4.8–5.6)

## 2019-04-23 ENCOUNTER — Other Ambulatory Visit: Payer: Self-pay | Admitting: Internal Medicine

## 2019-05-04 ENCOUNTER — Other Ambulatory Visit: Payer: Self-pay

## 2019-05-04 MED ORDER — MICROLET LANCETS MISC: 1 refills | Status: DC

## 2019-05-04 MED ORDER — CONTOUR TEST VI STRP: ORAL_STRIP | 1 refills | Status: DC

## 2019-05-13 ENCOUNTER — Other Ambulatory Visit: Payer: Self-pay

## 2019-06-11 LAB — HM MAMMOGRAPHY

## 2019-06-17 ENCOUNTER — Encounter: Payer: Self-pay | Admitting: Internal Medicine

## 2019-07-01 ENCOUNTER — Other Ambulatory Visit: Payer: Self-pay | Admitting: Internal Medicine

## 2019-08-15 ENCOUNTER — Other Ambulatory Visit: Payer: Self-pay | Admitting: Internal Medicine

## 2019-08-31 ENCOUNTER — Other Ambulatory Visit: Payer: Self-pay | Admitting: Internal Medicine

## 2019-09-17 ENCOUNTER — Ambulatory Visit: Payer: Medicare Other | Admitting: Internal Medicine

## 2019-09-17 ENCOUNTER — Ambulatory Visit: Payer: Medicare Other

## 2019-09-24 ENCOUNTER — Ambulatory Visit: Payer: Medicare Other | Admitting: Internal Medicine

## 2019-09-24 ENCOUNTER — Ambulatory Visit: Payer: Medicare Other

## 2019-09-28 ENCOUNTER — Other Ambulatory Visit: Payer: Self-pay | Admitting: Internal Medicine

## 2019-10-01 ENCOUNTER — Ambulatory Visit (INDEPENDENT_AMBULATORY_CARE_PROVIDER_SITE_OTHER): Payer: Medicare Other | Admitting: Internal Medicine

## 2019-10-01 ENCOUNTER — Other Ambulatory Visit: Payer: Self-pay

## 2019-10-01 ENCOUNTER — Ambulatory Visit (INDEPENDENT_AMBULATORY_CARE_PROVIDER_SITE_OTHER): Payer: Medicare Other

## 2019-10-01 ENCOUNTER — Encounter: Payer: Self-pay | Admitting: Internal Medicine

## 2019-10-01 VITALS — BP 132/74 | HR 56 | Temp 97.6°F | Ht 62.6 in | Wt 186.2 lb

## 2019-10-01 DIAGNOSIS — N182 Chronic kidney disease, stage 2 (mild): Secondary | ICD-10-CM | POA: Diagnosis not present

## 2019-10-01 DIAGNOSIS — E1122 Type 2 diabetes mellitus with diabetic chronic kidney disease: Secondary | ICD-10-CM | POA: Diagnosis not present

## 2019-10-01 DIAGNOSIS — I129 Hypertensive chronic kidney disease with stage 1 through stage 4 chronic kidney disease, or unspecified chronic kidney disease: Secondary | ICD-10-CM

## 2019-10-01 DIAGNOSIS — Z0000 Encounter for general adult medical examination without abnormal findings: Secondary | ICD-10-CM

## 2019-10-01 DIAGNOSIS — E6609 Other obesity due to excess calories: Secondary | ICD-10-CM

## 2019-10-01 DIAGNOSIS — Z6833 Body mass index (BMI) 33.0-33.9, adult: Secondary | ICD-10-CM

## 2019-10-01 DIAGNOSIS — E039 Hypothyroidism, unspecified: Secondary | ICD-10-CM

## 2019-10-01 DIAGNOSIS — Z23 Encounter for immunization: Secondary | ICD-10-CM

## 2019-10-01 LAB — POCT URINALYSIS DIPSTICK
Bilirubin, UA: NEGATIVE
Glucose, UA: NEGATIVE
Ketones, UA: NEGATIVE
Leukocytes, UA: NEGATIVE
Nitrite, UA: NEGATIVE
Protein, UA: NEGATIVE
Spec Grav, UA: 1.01 (ref 1.010–1.025)
Urobilinogen, UA: 0.2 E.U./dL
pH, UA: 6 (ref 5.0–8.0)

## 2019-10-01 LAB — POCT UA - MICROALBUMIN
Albumin/Creatinine Ratio, Urine, POC: <30
Creatinine, POC: 50 mg/dL
Microalbumin Ur, POC: 10 mg/L

## 2019-10-01 MED ORDER — SHINGRIX 50 MCG/0.5ML IM SUSR: 0.5000 mL | Freq: Once | INTRAMUSCULAR | 0 refills | Status: AC

## 2019-10-01 NOTE — Patient Instructions (Addendum)
[facility] (champagne room)  [facility] [employer]   [place] [facility] w/ Glenetta Hew    Diabetes Mellitus and Exercise Exercising regularly is important for your overall health, especially when you have diabetes (diabetes mellitus). Exercising is not only about losing weight. It has many other health benefits, such as increasing muscle strength and bone density and reducing body fat and stress. This leads to improved fitness, flexibility, and endurance, all of which result in better overall health. Exercise has additional benefits for people with diabetes, including:  Reducing appetite.  Helping to lower and control blood glucose.  Lowering blood pressure.  Helping to control amounts of fatty substances (lipids) in the blood, such as cholesterol and triglycerides.  Helping the body to respond better to insulin (improving insulin sensitivity).  Reducing how much insulin the body needs.  Decreasing the risk for heart disease by: ? Lowering cholesterol and triglyceride levels. ? Increasing the levels of good cholesterol. ? Lowering blood glucose levels. What is my activity plan? Your health care provider or certified diabetes educator can help you make a plan for the type and frequency of exercise (activity plan) that works for you. Make sure that you:  Do at least 150 minutes of moderate-intensity or vigorous-intensity exercise each week. This could be brisk walking, biking, or water aerobics. ? Do stretching and strength exercises, such as yoga or weightlifting, at least 2 times a week. ? Spread out your activity over at least 3 days of the week.  Get some form of physical activity every day. ? Do not go more than 2 days in a row without some kind of physical activity. ? Avoid being inactive for more than 30 minutes at a time. Take frequent breaks to walk or stretch.  Choose a type of exercise or activity that you enjoy, and set realistic  goals.  Start slowly, and gradually increase the intensity of your exercise over time. What do I need to know about managing my diabetes?   Check your blood glucose before and after exercising. ? If your blood glucose is 240 mg/dL (69.6 mmol/L) or higher before you exercise, check your urine for ketones. If you have ketones in your urine, do not exercise until your blood glucose returns to normal. ? If your blood glucose is 100 mg/dL (5.6 mmol/L) or lower, eat a snack containing 15-20 grams of carbohydrate. Check your blood glucose 15 minutes after the snack to make sure that your level is above 100 mg/dL (5.6 mmol/L) before you start your exercise.  Know the symptoms of low blood glucose (hypoglycemia) and how to treat it. Your risk for hypoglycemia increases during and after exercise. Common symptoms of hypoglycemia can include: ? Hunger. ? Anxiety. ? Sweating and feeling clammy. ? Confusion. ? Dizziness or feeling light-headed. ? Increased heart rate or palpitations. ? Blurry vision. ? Tingling or numbness around the mouth, lips, or tongue. ? Tremors or shakes. ? Irritability.  Keep a rapid-acting carbohydrate snack available before, during, and after exercise to help prevent or treat hypoglycemia.  Avoid injecting insulin into areas of the body that are going to be exercised. For example, avoid injecting insulin into: ? The arms, when playing tennis. ? The legs, when jogging.  Keep records of your exercise habits. Doing this can help you and your health care provider adjust your diabetes management plan as needed. Write down: ? Food that you eat before and after you exercise. ? Blood glucose  levels before and after you exercise. ? The type and amount of exercise you have done. ? When your insulin is expected to peak, if you use insulin. Avoid exercising at times when your insulin is peaking.  When you start a new exercise or activity, work with your health care provider to make  sure the activity is safe for you, and to adjust your insulin, medicines, or food intake as needed.  Drink plenty of water while you exercise to prevent dehydration or heat stroke. Drink enough fluid to keep your urine clear or pale yellow. Summary  Exercising regularly is important for your overall health, especially when you have diabetes (diabetes mellitus).  Exercising has many health benefits, such as increasing muscle strength and bone density and reducing body fat and stress.  Your health care provider or certified diabetes educator can help you make a plan for the type and frequency of exercise (activity plan) that works for you.  When you start a new exercise or activity, work with your health care provider to make sure the activity is safe for you, and to adjust your insulin, medicines, or food intake as needed. This information is not intended to replace advice given to you by your health care provider. Make sure you discuss any questions you have with your health care provider. Document Revised: 11/15/2016 Document Reviewed: 10/03/2015 Elsevier Patient Education  [place].

## 2019-10-01 NOTE — Patient Instructions (Signed)
Kelli Bond , Thank you for taking time to come for your Medicare Wellness Visit. I appreciate your ongoing commitment to your health goals. Please review the following plan we discussed and let me know if I can assist you in the future.   Screening recommendations/referrals: Colonoscopy: requesting report Mammogram: 06/2019 Bone Density: 05/2018 Recommended yearly ophthalmology/optometry visit for glaucoma screening and checkup Recommended yearly dental visit for hygiene and checkup  Vaccinations: Influenza vaccine: 02/2019 Pneumococcal vaccine: 09/2018 Tdap vaccine: 12/2018 Shingles vaccine: discussed    Advanced directives: Advance directive discussed with you today. Even though you declined this today please call our office should you change your mind and we can give you the proper paperwork for you to fill out.  Conditions/risks identified: obesity  Next appointment: 12/21/2019 at 11:00   Preventive Care 68 Years and Older, Female Preventive care refers to lifestyle choices and visits with your health care provider that can promote health and wellness. What does preventive care include?  A yearly physical exam. This is also called an annual well check.  Dental exams once or twice a year.  Routine eye exams. Ask your health care provider how often you should have your eyes checked.  Personal lifestyle choices, including:  Daily care of your teeth and gums.  Regular physical activity.  Eating a healthy diet.  Avoiding tobacco and drug use.  Limiting alcohol use.  Practicing safe sex.  Taking low-dose aspirin every day.  Taking vitamin and mineral supplements as recommended by your health care provider. What happens during an annual well check? The services and screenings done by your health care provider during your annual well check will depend on your age, overall health, lifestyle risk factors, and family history of disease. Counseling  Your health care provider  may ask you questions about your:  Alcohol use.  Tobacco use.  Drug use.  Emotional well-being.  Home and relationship well-being.  Sexual activity.  Eating habits.  History of falls.  Memory and ability to understand (cognition).  Work and work Astronomer.  Reproductive health. Screening  You may have the following tests or measurements:  Height, weight, and BMI.  Blood pressure.  Lipid and cholesterol levels. These may be checked every 5 years, or more frequently if you are over 68 years old.  Skin check.  Lung cancer screening. You may have this screening every year starting at age 38 if you have a 30-pack-year history of smoking and currently smoke or have quit within the past 15 years.  Fecal occult blood test (FOBT) of the stool. You may have this test every year starting at age 38.  Flexible sigmoidoscopy or colonoscopy. You may have a sigmoidoscopy every 5 years or a colonoscopy every 10 years starting at age 53.  Hepatitis C blood test.  Hepatitis B blood test.  Sexually transmitted disease (STD) testing.  Diabetes screening. This is done by checking your blood sugar (glucose) after you have not eaten for a while (fasting). You may have this done every 1-3 years.  Bone density scan. This is done to screen for osteoporosis. You may have this done starting at age 47.  Mammogram. This may be done every 1-2 years. Talk to your health care provider about how often you should have regular mammograms. Talk with your health care provider about your test results, treatment options, and if necessary, the need for more tests. Vaccines  Your health care provider may recommend certain vaccines, such as:  Influenza vaccine. This is recommended every  year.  Tetanus, diphtheria, and acellular pertussis (Tdap, Td) vaccine. You may need a Td booster every 10 years.  Zoster vaccine. You may need this after age 5.  Pneumococcal 13-valent conjugate (PCV13) vaccine.  One dose is recommended after age 79.  Pneumococcal polysaccharide (PPSV23) vaccine. One dose is recommended after age 78. Talk to your health care provider about which screenings and vaccines you need and how often you need them. This information is not intended to replace advice given to you by your health care provider. Make sure you discuss any questions you have with your health care provider. Document Released: 05/20/2015 Document Revised: 01/11/2016 Document Reviewed: 02/22/2015 Elsevier Interactive Patient Education  2017 [place] Prevention in the Home Falls can cause injuries. They can happen to people of all ages. There are many things you can do to make your home safe and to help prevent falls. What can I do on the outside of my home?  Regularly fix the edges of walkways and driveways and fix any cracks.  Remove anything that might make you trip as you walk through a door, such as a raised step or threshold.  Trim any bushes or trees on the path to your home.  Use bright outdoor lighting.  Clear any walking paths of anything that might make someone trip, such as rocks or tools.  Regularly check to see if handrails are loose or broken. Make sure that both sides of any steps have handrails.  Any raised decks and porches should have guardrails on the edges.  Have any leaves, snow, or ice cleared regularly.  Use sand or salt on walking paths during winter.  Clean up any spills in your garage right away. This includes oil or grease spills. What can I do in the bathroom?  Use night lights.  Install grab bars by the toilet and in the tub and shower. Do not use towel bars as grab bars.  Use non-skid mats or decals in the tub or shower.  If you need to sit down in the shower, use a plastic, non-slip stool.  Keep the floor dry. Clean up any water that spills on the floor as soon as it happens.  Remove soap buildup in the tub or shower regularly.  Attach bath  mats securely with double-sided non-slip rug tape.  Do not have throw rugs and other things on the floor that can make you trip. What can I do in the bedroom?  Use night lights.  Make sure that you have a light by your bed that is easy to reach.  Do not use any sheets or blankets that are too big for your bed. They should not hang down onto the floor.  Have a firm chair that has side arms. You can use this for support while you get dressed.  Do not have throw rugs and other things on the floor that can make you trip. What can I do in the kitchen?  Clean up any spills right away.  Avoid walking on wet floors.  Keep items that you use a lot in easy-to-reach places.  If you need to reach something above you, use a strong step stool that has a grab bar.  Keep electrical cords out of the way.  Do not use floor polish or wax that makes floors slippery. If you must use wax, use non-skid floor wax.  Do not have throw rugs and other things on the floor that can make you trip. What can  I do with my stairs?  Do not leave any items on the stairs.  Make sure that there are handrails on both sides of the stairs and use them. Fix handrails that are broken or loose. Make sure that handrails are as long as the stairways.  Check any carpeting to make sure that it is firmly attached to the stairs. Fix any carpet that is loose or worn.  Avoid having throw rugs at the top or bottom of the stairs. If you do have throw rugs, attach them to the floor with carpet tape.  Make sure that you have a light switch at the top of the stairs and the bottom of the stairs. If you do not have them, ask someone to add them for you. What else can I do to help prevent falls?  Wear shoes that:  Do not have high heels.  Have rubber bottoms.  Are comfortable and fit you well.  Are closed at the toe. Do not wear sandals.  If you use a stepladder:  Make sure that it is fully opened. Do not climb a closed  stepladder.  Make sure that both sides of the stepladder are locked into place.  Ask someone to hold it for you, if possible.  Clearly mark and make sure that you can see:  Any grab bars or handrails.  First and last steps.  Where the edge of each step is.  Use tools that help you move around (mobility aids) if they are needed. These include:  Canes.  Walkers.  Scooters.  Crutches.  Turn on the lights when you go into a dark area. Replace any light bulbs as soon as they burn out.  Set up your furniture so you have a clear path. Avoid moving your furniture around.  If any of your floors are uneven, fix them.  If there are any pets around you, be aware of where they are.  Review your medicines with your doctor. Some medicines can make you feel dizzy. This can increase your chance of falling. Ask your doctor what other things that you can do to help prevent falls. This information is not intended to replace advice given to you by your health care provider. Make sure you discuss any questions you have with your health care provider. Document Released: 02/17/2009 Document Revised: 09/29/2015 Document Reviewed: 05/28/2014 Elsevier Interactive Patient Education  2017 [employer].

## 2019-10-01 NOTE — Progress Notes (Signed)
This visit occurred during the SARS-CoV-2 public health emergency.  Safety protocols were in place, including screening questions prior to the visit, additional usage of staff PPE, and extensive cleaning of exam room while observing appropriate contact time as indicated for disinfecting solutions.  Subjective:     Patient ID: Kelli Bond , female    DOB: [birth year] , 69 y.o.   MRN: [id number]   Chief Complaint  Patient presents with  . Diabetes  . Hypertension    HPI  She is here today for diabetes/BP check.  She reports compliance with meds. 023  Diabetes She presents for her follow-up diabetic visit. She has type 2 diabetes mellitus. Her disease course has been stable. There are no hypoglycemic associated symptoms. Pertinent negatives for diabetes include no blurred vision and no chest pain. There are no hypoglycemic complications. Risk factors for coronary artery disease include diabetes mellitus, dyslipidemia, hypertension, obesity, sedentary lifestyle and post-menopausal. She participates in exercise intermittently. Eye exam is current.  Hypertension This is a chronic problem. The current episode started more than 1 year ago. The problem has been gradually improving since onset. The problem is controlled. Pertinent negatives include no blurred vision, chest pain, palpitations or shortness of breath. Risk factors for coronary artery disease include diabetes mellitus, dyslipidemia, post-menopausal state and sedentary lifestyle.     Past Medical History:  Diagnosis Date  . Diabetes mellitus without complication ([facility])   . Essential hypertension, benign 12/28/2013  . Hypertension   . Pure hypercholesterolemia 12/28/2013  . Thyroid disease      Family History  Problem Relation Age of Onset  . Hypertension Father   . Kidney disease Father   . Bradycardia Mother        pacemaker  . Breast cancer Sister   . Kidney disease Brother      Current Outpatient Medications:  .   aspirin EC 81 MG tablet, Take 81 mg by mouth daily., Disp: , Rfl:  .  cetirizine (ZYRTEC) 10 MG tablet, Take 10 mg by mouth daily as needed for allergies. prn, Disp: , Rfl:  .  cholecalciferol (VITAMIN D) 1000 UNITS tablet, Take 1,000 Units by mouth daily. , Disp: , Rfl:  .  docusate sodium (COLACE) 100 MG capsule, Take by mouth., Disp: , Rfl:  .  fluticasone (FLONASE) 50 MCG/ACT nasal spray, USE 2 SPRAYS IN EACH NOSTRIL EVERY DAY (Patient not taking: Reported on 10/01/2019), Disp: 16 g, Rfl: 2 .  JANUVIA 100 MG tablet, TAKE 1 TABLET BY MOUTH EVERY DAY, Disp: 30 tablet, Rfl: 1 .  Lancets (ONETOUCH DELICA PLUS [id number]) MISC, USE TO check blood sugar ONCE DAILY AS DIRECTED **100 DAY supply**, Disp: 100 each, Rfl: 1 .  latanoprost (XALATAN) 0.005 % ophthalmic solution, Place 1 drop into both eyes at bedtime., Disp: , Rfl:  .  rosuvastatin (CRESTOR) 20 MG tablet, TAKE 1 TABLET BY MOUTH EVERY DAY, Disp: 90 tablet, Rfl: 2 .  SYNTHROID 112 MCG tablet, TAKE 1 TABLET BY MOUTH EVERY DAY, Disp: 30 tablet, Rfl: 3 .  telmisartan-hydrochlorothiazide (MICARDIS HCT) 40-12.5 MG tablet, TAKE 1 TABLET BY MOUTH EVERY DAY, Disp: 90 tablet, Rfl: 2 .  Zoster Vaccine Adjuvanted [facility]) injection, Inject 0.5 mLs into the muscle once for 1 dose., Disp: 0.5 mL, Rfl: 0   Allergies  Allergen Reactions  . Banana Other (See Comments)    Tingling in mouth.  . Sulfa Antibiotics Rash     Review of Systems  Constitutional: Negative.   Eyes: Negative for  blurred vision.  Respiratory: Negative.  Negative for shortness of breath.   Cardiovascular: Negative.  Negative for chest pain and palpitations.  Gastrointestinal: Negative.   Neurological: Negative.   Psychiatric/Behavioral: Negative.      Today's Vitals   10/01/19 1535  BP: 132/74  Pulse: (!) 56  Temp: 97.6 F (36.4 C)  TempSrc: Oral  Weight: 186 lb 3.2 oz (84.5 kg)  Height: 5' 2.6" (1.59 m)   Body mass index is 33.41 kg/m.   Objective:  Physical  Exam Vitals and nursing note reviewed.  Constitutional:      Appearance: Normal appearance. She is obese.  HENT:     Head: Normocephalic and atraumatic.  Cardiovascular:     Rate and Rhythm: Normal rate and regular rhythm.     Heart sounds: Normal heart sounds.  Pulmonary:     Effort: Pulmonary effort is normal.     Breath sounds: Normal breath sounds.  Skin:    General: Skin is warm.  Neurological:     General: No focal deficit present.     Mental Status: She is alert.  Psychiatric:        Mood and Affect: Mood normal.        Behavior: Behavior normal.         Assessment And Plan:     1. Type 2 diabetes mellitus with stage 2 chronic kidney disease, without long-term current use of insulin (HCC)  Chronic, I will check labs as listed below. Importance of regular exercise was discussed with the patient.   - Lipid panel - CMP14+EGFR - Hemoglobin A1c  2. Hypertensive nephropathy  Chronic, controlled. She will continue with current meds. She is encouraged to avoid adding salt to her foods.   3. Primary hypothyroidism  I will check thyroid panel and adjust meds as needed.  - TSH  4. Class 1 obesity due to excess calories with serious comorbidity and body mass index (BMI) of 33.0 to 33.9 in adult  She is encouraged to strive for BMI less than 30 to decrease cardiac risk. She should aim for at least 150 minutes of moderate exercise per week.   5. Immunization due  A rx Shingrix was sent to her local pharmacy. She has received both COVID vaccines.   [name], MD    THE PATIENT IS ENCOURAGED TO PRACTICE SOCIAL DISTANCING DUE TO THE COVID-19 PANDEMIC.

## 2019-10-01 NOTE — Progress Notes (Signed)
This visit occurred during the SARS-CoV-2 public health emergency.  Safety protocols were in place, including screening questions prior to the visit, additional usage of staff PPE, and extensive cleaning of exam room while observing appropriate contact time as indicated for disinfecting solutions.  Subjective:   Kelli Bond is a 69 y.o. female who presents for Medicare Annual (Subsequent) preventive examination.  Review of Systems:  n/a Cardiac Risk Factors include: advanced age (>58men, >70 women);diabetes mellitus;dyslipidemia;sedentary lifestyle     Objective:     Vitals: BP 132/74 (BP Location: Right Arm, Patient Position: Sitting, Cuff Size: Normal)   Pulse (!) 56   Temp 97.6 F (36.4 C) (Oral)   Ht 5' 2.6" (1.59 m)   Wt 186 lb 3.2 oz (84.5 kg)   BMI 33.41 kg/m   Body mass index is 33.41 kg/m.  Advanced Directives 10/01/2019 09/16/2018 10/10/2017  Does Patient Have a Medical Advance Directive? No No No  Would patient like information on creating a medical advance directive? No - Patient declined - -    Tobacco Social History   Tobacco Use  Smoking Status Never Smoker  Smokeless Tobacco Never Used     Counseling given: Not Answered   Clinical Intake:  Pre-visit preparation completed: Yes        Nutritional Status: BMI > 30  Obese Nutritional Risks: None Diabetes: Yes  How often do you need to have someone help you when you read instructions, pamphlets, or other written materials from your doctor or pharmacy?: 1 - Never What is the last grade level you completed in school?: master's degree  Interpreter Needed?: No  Information entered by :: NAllen LPN  Past Medical History:  Diagnosis Date  . Diabetes mellitus without complication ([place])   . Essential hypertension, benign 12/28/2013  . Hypertension   . Pure hypercholesterolemia 12/28/2013  . Thyroid disease    Past Surgical History:  Procedure Laterality Date  . ABDOMINAL HYSTERECTOMY    . ORIF  ANKLE FRACTURE Right 10/10/2017   Procedure: OPEN REDUCTION INTERNAL FIXATION (ORIF) ANKLE FRACTURE;  Surgeon: Hessie Knows, MD;  Location: [facility] ORS;  Service: Orthopedics;  Laterality: Right;   Family History  Problem Relation Age of Onset  . Hypertension Father   . Kidney disease Father   . Bradycardia Mother        pacemaker  . Breast cancer Sister   . Kidney disease Brother    Social History   Socioeconomic History  . Marital status: Married    Spouse name: Not on file  . Number of children: Not on file  . Years of education: Not on file  . Highest education level: Not on file  Occupational History  . Not on file  Tobacco Use  . Smoking status: Never Smoker  . Smokeless tobacco: Never Used  Substance and Sexual Activity  . Alcohol use: Not Currently  . Drug use: Never  . Sexual activity: Yes  Other Topics Concern  . Not on file  Social History Narrative  . Not on file   Social Determinants of Health   Financial Resource Strain: Low Risk   . Difficulty of Paying Living Expenses: Not hard at all  Food Insecurity: No Food Insecurity  . Worried About Charity fundraiser in the Last Year: Never true  . Ran Out of Food in the Last Year: Never true  Transportation Needs: No Transportation Needs  . Lack of Transportation (Medical): No  . Lack of Transportation (Non-Medical): No  Physical Activity:  Insufficiently Active  . Days of Exercise per Week: 2 days  . Minutes of Exercise per Session: 20 min  Stress: No Stress Concern Present  . Feeling of Stress : Not at all  Social Connections:   . Frequency of Communication with Friends and Family:   . Frequency of Social Gatherings with Friends and Family:   . Attends Religious Services:   . Active Member of Clubs or Organizations:   . Attends Banker Meetings:   Marland Kitchen Marital Status:     Outpatient Encounter Medications as of 10/01/2019  Medication Sig  . aspirin EC 81 MG tablet Take 81 mg by mouth daily.  .  cetirizine (ZYRTEC) 10 MG tablet Take 10 mg by mouth daily as needed for allergies. prn  . cholecalciferol (VITAMIN D) 1000 UNITS tablet Take 1,000 Units by mouth daily.   Marland Kitchen docusate sodium (COLACE) 100 MG capsule Take by mouth.  Marland Kitchen JANUVIA 100 MG tablet TAKE 1 TABLET BY MOUTH EVERY DAY  . Lancets (ONETOUCH DELICA PLUS LANCET33G) MISC USE TO check blood sugar ONCE DAILY AS DIRECTED **100 DAY supply**  . latanoprost (XALATAN) 0.005 % ophthalmic solution Place 1 drop into both eyes at bedtime.  . rosuvastatin (CRESTOR) 20 MG tablet TAKE 1 TABLET BY MOUTH EVERY DAY  . SYNTHROID 112 MCG tablet TAKE 1 TABLET BY MOUTH EVERY DAY  . telmisartan-hydrochlorothiazide (MICARDIS HCT) 40-12.5 MG tablet TAKE 1 TABLET BY MOUTH EVERY DAY  . fluticasone (FLONASE) 50 MCG/ACT nasal spray USE 2 SPRAYS IN EACH NOSTRIL EVERY DAY (Patient not taking: Reported on 10/01/2019)   No facility-administered encounter medications on file as of 10/01/2019.    Activities of Daily Living In your present state of health, do you have any difficulty performing the following activities: 10/01/2019  Hearing? N  Vision? N  Difficulty concentrating or making decisions? N  Walking or climbing stairs? N  Comment just needs to be careful  Dressing or bathing? N  Doing errands, shopping? N  Preparing Food and eating ? N  Using the Toilet? N  In the past six months, have you accidently leaked urine? N  Do you have problems with loss of bowel control? N  Managing your Medications? N  Managing your Finances? N  Housekeeping or managing your Housekeeping? N  Some recent data might be hidden    Patient Care Team: Dorothyann Peng, MD as PCP - General (Internal Medicine)    Assessment:   This is a routine wellness examination for Kelli Bond.  Exercise Activities and Dietary recommendations Current Exercise Habits: Home exercise routine, Type of exercise: walking, Time (Minutes): 20, Frequency (Times/Week): 2, Weekly Exercise  (Minutes/Week): 40  Goals    . Patient Stated     10/01/2019, wants to exercise more (would like to start water aerobics)    . Weight (lb) < 200 lb (90.7 kg)     Would like to lose 20 pounds       Fall Risk Fall Risk  10/01/2019 04/15/2019 12/11/2018 09/16/2018 08/13/2018  Falls in the past year? 0 0 0 0 1  Number falls in past yr: - - - - 0  Injury with Fall? - - - - 1  Risk for fall due to : Medication side effect - - Medication side effect -  Follow up Falls evaluation completed;Education provided;Falls prevention discussed - - - -   Is the patient's home free of loose throw rugs in walkways, pet beds, electrical cords, etc?   yes  Grab bars in the bathroom? no      Handrails on the stairs?   yes      Adequate lighting?   yes  Timed Get Up and Go performed: n/a  Depression Screen PHQ 2/9 Scores 10/01/2019 04/15/2019 12/11/2018 09/16/2018  PHQ - 2 Score 0 0 0 0  PHQ- 9 Score 0 - - 0     Cognitive Function     6CIT Screen 10/01/2019 09/16/2018  What Year? 0 points 0 points  What month? 0 points 0 points  What time? 0 points 0 points  Count back from 20 0 points 0 points  Months in reverse 0 points 0 points  Repeat phrase 0 points 0 points  Total Score 0 0    Immunization History  Administered Date(s) Administered  . Influenza, High Dose Seasonal PF 02/04/2018  . Influenza,inj,Quad PF,6+ Mos 03/14/2017  . Influenza-Unspecified 02/10/2019  . PFIZER SARS-COV-2 Vaccination 05/09/2019, 05/29/2019  . Pneumococcal Conjugate-13 09/16/2018  . Pneumococcal Polysaccharide-23 07/20/2014  . Tdap 12/31/2018    Qualifies for Shingles Vaccine? yes  Screening Tests Health Maintenance  Topic Date Due  . HEMOGLOBIN A1C  10/14/2019  . INFLUENZA VACCINE  12/06/2019  . FOOT EXAM  12/11/2019  . OPHTHALMOLOGY EXAM  02/04/2020  . MAMMOGRAM  06/10/2021  . COLONOSCOPY  12/19/2022  . TETANUS/TDAP  12/31/2028  . DEXA SCAN  Completed  . COVID-19 Vaccine  Completed  . Hepatitis C  Screening  Completed  . PNA vac Low Risk Adult  Completed    Cancer Screenings: Lung: Low Dose CT Chest recommended if Age 52-80 years, 30 pack-year currently smoking OR have quit w/in 15years. Patient does not qualify. Breast:  Up to date on Mammogram? Yes   Up to date of Bone Density/Dexa? Yes Colorectal: up to date  Additional Screenings: : Hepatitis C Screening: 10/18/2015     Plan:    Patient wants to exercise more. Would like to start water aerobics.   I have personally reviewed and noted the following in the patient's chart:   . Medical and social history . Use of alcohol, tobacco or illicit drugs  . Current medications and supplements . Functional ability and status . Nutritional status . Physical activity . Advanced directives . List of other physicians . Hospitalizations, surgeries, and ER visits in previous 12 months . Vitals . Screenings to include cognitive, depression, and falls . Referrals and appointments  In addition, I have reviewed and discussed with patient certain preventive protocols, quality metrics, and best practice recommendations. A written personalized care plan for preventive services as well as general preventive health recommendations were provided to patient.     Barb Merino, LPN  [id number]

## 2019-10-02 LAB — CMP14+EGFR
ALT: 22 IU/L (ref 0–32)
AST: 24 IU/L (ref 0–40)
Albumin/Globulin Ratio: 1.8 (ref 1.2–2.2)
Albumin: 4.4 g/dL (ref 3.8–4.8)
Alkaline Phosphatase: 76 IU/L (ref 48–121)
BUN/Creatinine Ratio: 10 — ABNORMAL LOW (ref 12–28)
BUN: 11 mg/dL (ref 8–27)
Bilirubin Total: 0.3 mg/dL (ref 0.0–1.2)
CO2: 25 mmol/L (ref 20–29)
Calcium: 9.5 mg/dL (ref 8.7–10.3)
Chloride: 99 mmol/L (ref 96–106)
Creatinine, Ser: 1.14 mg/dL — ABNORMAL HIGH (ref 0.57–1.00)
GFR calc Af Amer: 57 mL/min/{1.73_m2} — ABNORMAL LOW (ref >59)
GFR calc non Af Amer: 50 mL/min/{1.73_m2} — ABNORMAL LOW (ref >59)
Globulin, Total: 2.5 g/dL (ref 1.5–4.5)
Glucose: 81 mg/dL (ref 65–99)
Potassium: 4.2 mmol/L (ref 3.5–5.2)
Sodium: 137 mmol/L (ref 134–144)
Total Protein: 6.9 g/dL (ref 6.0–8.5)

## 2019-10-02 LAB — HEMOGLOBIN A1C
Est. average glucose Bld gHb Est-mCnc: 137 mg/dL
Hgb A1c MFr Bld: 6.4 % — ABNORMAL HIGH (ref 4.8–5.6)

## 2019-10-02 LAB — LIPID PANEL
Chol/HDL Ratio: 2.2 ratio (ref 0.0–4.4)
Cholesterol, Total: 110 mg/dL (ref 100–199)
HDL: 49 mg/dL (ref >39)
LDL Chol Calc (NIH): 47 mg/dL (ref 0–99)
Triglycerides: 63 mg/dL (ref 0–149)
VLDL Cholesterol Cal: 14 mg/dL (ref 5–40)

## 2019-10-02 LAB — TSH: TSH: 0.584 u[IU]/mL (ref 0.450–4.500)

## 2019-10-03 ENCOUNTER — Encounter: Payer: Self-pay | Admitting: Internal Medicine

## 2019-10-26 ENCOUNTER — Other Ambulatory Visit: Payer: Self-pay | Admitting: Internal Medicine

## 2019-11-18 ENCOUNTER — Encounter: Payer: Self-pay | Admitting: Internal Medicine

## 2019-11-18 ENCOUNTER — Other Ambulatory Visit: Payer: Self-pay

## 2019-11-18 ENCOUNTER — Ambulatory Visit (INDEPENDENT_AMBULATORY_CARE_PROVIDER_SITE_OTHER): Payer: Medicare Other | Admitting: Internal Medicine

## 2019-11-18 VITALS — BP 144/82 | HR 66 | Temp 97.9°F | Ht 62.8 in | Wt 186.4 lb

## 2019-11-18 DIAGNOSIS — E6609 Other obesity due to excess calories: Secondary | ICD-10-CM | POA: Diagnosis not present

## 2019-11-18 DIAGNOSIS — Z6833 Body mass index (BMI) 33.0-33.9, adult: Secondary | ICD-10-CM

## 2019-11-18 DIAGNOSIS — E039 Hypothyroidism, unspecified: Secondary | ICD-10-CM | POA: Diagnosis not present

## 2019-11-18 DIAGNOSIS — I129 Hypertensive chronic kidney disease with stage 1 through stage 4 chronic kidney disease, or unspecified chronic kidney disease: Secondary | ICD-10-CM

## 2019-11-18 DIAGNOSIS — N182 Chronic kidney disease, stage 2 (mild): Secondary | ICD-10-CM | POA: Diagnosis not present

## 2019-11-18 NOTE — Progress Notes (Signed)
This visit occurred during the SARS-CoV-2 public health emergency.  Safety protocols were in place, including screening questions prior to the visit, additional usage of staff PPE, and extensive cleaning of exam room while observing appropriate contact time as indicated for disinfecting solutions.  Subjective:     Patient ID: Kelli Bond , female    DOB: May 06, 1951 , 69 y.o.   MRN: [id number]   Chief Complaint  Patient presents with  . Thyroid Problem    HPI  She presents today for f/u hypothyroidism. She has not had any issues with the medication. TSH found to be low end of normal at last visit. She is here today for repeat labwork.     Past Medical History:  Diagnosis Date  . Diabetes mellitus without complication ([facility])   . Essential hypertension, benign 12/28/2013  . Hypertension   . Pure hypercholesterolemia 12/28/2013  . Thyroid disease      Family History  Problem Relation Age of Onset  . Hypertension Father   . Kidney disease Father   . Bradycardia Mother        pacemaker  . Breast cancer Sister   . Kidney disease Brother      Current Outpatient Medications:  .  aspirin EC 81 MG tablet, Take 81 mg by mouth daily., Disp: , Rfl:  .  cetirizine (ZYRTEC) 10 MG tablet, Take 10 mg by mouth daily as needed for allergies. prn, Disp: , Rfl:  .  cholecalciferol (VITAMIN D) 1000 UNITS tablet, Take 1,000 Units by mouth daily. , Disp: , Rfl:  .  docusate sodium (COLACE) 100 MG capsule, Take by mouth., Disp: , Rfl:  .  JANUVIA 100 MG tablet, TAKE 1 TABLET BY MOUTH EVERY DAY, Disp: 30 tablet, Rfl: 1 .  Lancets (ONETOUCH DELICA PLUS LANCET33G) MISC, USE TO check blood sugar ONCE DAILY AS DIRECTED **100 DAY supply**, Disp: 100 each, Rfl: 1 .  latanoprost (XALATAN) 0.005 % ophthalmic solution, Place 1 drop into both eyes at bedtime., Disp: , Rfl:  .  ONETOUCH ULTRA test strip, USE TO check blood sugar ONCE DAILY 100 DAY supply, Disp: , Rfl:  .  rosuvastatin (CRESTOR) 20 MG tablet,  TAKE 1 TABLET BY MOUTH EVERY DAY, Disp: 90 tablet, Rfl: 2 .  SYNTHROID 112 MCG tablet, TAKE 1 TABLET BY MOUTH EVERY DAY, Disp: 30 tablet, Rfl: 3 .  telmisartan-hydrochlorothiazide (MICARDIS HCT) 40-12.5 MG tablet, TAKE 1 TABLET BY MOUTH EVERY DAY, Disp: 90 tablet, Rfl: 2 .  fluticasone (FLONASE) 50 MCG/ACT nasal spray, USE 2 SPRAYS IN EACH NOSTRIL EVERY DAY (Patient not taking: Reported on 10/01/2019), Disp: 16 g, Rfl: 2   Allergies  Allergen Reactions  . Banana Other (See Comments)    Tingling in mouth.  . Sulfa Antibiotics Rash     Review of Systems  Constitutional: Negative.   Respiratory: Negative.   Cardiovascular: Negative.   Gastrointestinal: Negative.   Psychiatric/Behavioral: Negative.   All other systems reviewed and are negative.    Today's Vitals   11/18/19 1622  BP: (!) 144/82  Pulse: 66  Temp: 97.9 F (36.6 C)  TempSrc: Oral  Weight: 186 lb 6.4 oz (84.6 kg)  Height: 5' 2.8" (1.595 m)  PainSc: 0-No pain   Body mass index is 33.23 kg/m.  Wt Readings from Last 3 Encounters:  11/18/19 186 lb 6.4 oz (84.6 kg)  10/01/19 186 lb 3.2 oz (84.5 kg)  10/01/19 186 lb 3.2 oz (84.5 kg)   Objective:  Physical Exam Vitals and  nursing note reviewed.  Constitutional:      Appearance: Normal appearance. She is obese.  HENT:     Head: Normocephalic and atraumatic.  Cardiovascular:     Rate and Rhythm: Normal rate and regular rhythm.     Heart sounds: Normal heart sounds.  Pulmonary:     Breath sounds: Normal breath sounds.  Skin:    General: Skin is warm.  Neurological:     General: No focal deficit present.     Mental Status: She is alert and oriented to person, place, and time.         Assessment And Plan:     1. Primary hypothyroidism  Chronic. I will check repeat TSH today. I will adjust meds as needed.   - TSH  2. Hypertensive nephropathy  Chronic, fair control. She will continue with current meds. Encouraged to avoid adding salt to her foods.   3.  Chronic kidney disease, stage II (mild)  Chronic. This has been stable.   4. Class 1 obesity due to excess calories with serious comorbidity and body mass index (BMI) of 33.0 to 33.9 in adult  She is encouraged to initially strive for BMI less than 30 to decrease cardiac risk. She is advised to exercise no less than 150 minutes per week.     Patient was given opportunity to ask questions. Patient verbalized understanding of the plan and was able to repeat key elements of the plan. All questions were answered to their satisfaction.  Gwynneth Aliment, MD   I, Gwynneth Aliment, MD, have reviewed all documentation for this visit. The documentation on 11/20/19 for the exam, diagnosis, procedures, and orders are all accurate and complete.  THE PATIENT IS ENCOURAGED TO PRACTICE SOCIAL DISTANCING DUE TO THE COVID-19 PANDEMIC.

## 2019-11-18 NOTE — Patient Instructions (Signed)
Hypothyroidism  Hypothyroidism is when the thyroid gland does not make enough of certain hormones (it is underactive). The thyroid gland is a small gland located in the lower front part of the neck, just in front of the windpipe (trachea). This gland makes hormones that help control how the body uses food for energy (metabolism) as well as how the heart and brain function. These hormones also play a role in keeping your bones strong. When the thyroid is underactive, it produces too little of the hormones thyroxine (T4) and triiodothyronine (T3). What are the causes? This condition may be caused by:  Hashimoto's disease. This is a disease in which the body's disease-fighting system (immune system) attacks the thyroid gland. This is the most common cause.  Viral infections.  Pregnancy.  Certain medicines.  Birth defects.  Past radiation treatments to the head or neck for cancer.  Past treatment with radioactive iodine.  Past exposure to radiation in the environment.  Past surgical removal of part or all of the thyroid.  Problems with a gland in the center of the brain (pituitary gland).  Lack of enough iodine in the diet. What increases the risk? You are more likely to develop this condition if:  You are female.  You have a family history of thyroid conditions.  You use a medicine called lithium.  You take medicines that affect the immune system (immunosuppressants). What are the signs or symptoms? Symptoms of this condition include:  Feeling as though you have no energy (lethargy).  Not being able to tolerate cold.  Weight gain that is not explained by a change in diet or exercise habits.  Lack of appetite.  Dry skin.  Coarse hair.  Menstrual irregularity.  Slowing of thought processes.  Constipation.  Sadness or depression. How is this diagnosed? This condition may be diagnosed based on:  Your symptoms, your medical history, and a physical exam.  Blood  tests. You may also have imaging tests, such as an ultrasound or MRI. How is this treated? This condition is treated with medicine that replaces the thyroid hormones that your body does not make. After you begin treatment, it may take several weeks for symptoms to go away. Follow these instructions at home:  Take over-the-counter and prescription medicines only as told by your health care provider.  If you start taking any new medicines, tell your health care provider.  Keep all follow-up visits as told by your health care provider. This is important. ? As your condition improves, your dosage of thyroid hormone medicine may change. ? You will need to have blood tests regularly so that your health care provider can monitor your condition. Contact a health care provider if:  Your symptoms do not get better with treatment.  You are taking thyroid replacement medicine and you: ? Sweat a lot. ? Have tremors. ? Feel anxious. ? Lose weight rapidly. ? Cannot tolerate heat. ? Have emotional swings. ? Have diarrhea. ? Feel weak. Get help right away if you have:  Chest pain.  An irregular heartbeat.  A rapid heartbeat.  Difficulty breathing. Summary  Hypothyroidism is when the thyroid gland does not make enough of certain hormones (it is underactive).  When the thyroid is underactive, it produces too little of the hormones thyroxine (T4) and triiodothyronine (T3).  The most common cause is Hashimoto's disease, a disease in which the body's disease-fighting system (immune system) attacks the thyroid gland. The condition can also be caused by viral infections, medicine, pregnancy, or past   radiation treatment to the head or neck.  Symptoms may include weight gain, dry skin, constipation, feeling as though you do not have energy, and not being able to tolerate cold.  This condition is treated with medicine to replace the thyroid hormones that your body does not make. This information  is not intended to replace advice given to you by your health care provider. Make sure you discuss any questions you have with your health care provider. Document Revised: 04/05/2017 Document Reviewed: 04/03/2017 [employer]vier Patient Education  2020 [employer]  

## 2019-11-19 LAB — TSH: TSH: 1.21 u[IU]/mL (ref 0.450–4.500)

## 2019-11-23 ENCOUNTER — Other Ambulatory Visit: Payer: Self-pay | Admitting: Internal Medicine

## 2019-11-24 ENCOUNTER — Other Ambulatory Visit: Payer: Self-pay | Admitting: Internal Medicine

## 2019-12-21 ENCOUNTER — Encounter: Payer: Medicare Other | Admitting: Internal Medicine

## 2020-01-20 ENCOUNTER — Other Ambulatory Visit: Payer: Self-pay

## 2020-01-20 ENCOUNTER — Encounter: Payer: Self-pay | Admitting: Internal Medicine

## 2020-01-20 ENCOUNTER — Ambulatory Visit (INDEPENDENT_AMBULATORY_CARE_PROVIDER_SITE_OTHER): Payer: Medicare Other | Admitting: Internal Medicine

## 2020-01-20 VITALS — BP 132/60 | HR 80 | Temp 98.3°F | Ht 63.8 in | Wt 180.8 lb

## 2020-01-20 DIAGNOSIS — Z6833 Body mass index (BMI) 33.0-33.9, adult: Secondary | ICD-10-CM

## 2020-01-20 DIAGNOSIS — Z0000 Encounter for general adult medical examination without abnormal findings: Secondary | ICD-10-CM | POA: Diagnosis not present

## 2020-01-20 DIAGNOSIS — E6609 Other obesity due to excess calories: Secondary | ICD-10-CM

## 2020-01-20 DIAGNOSIS — E039 Hypothyroidism, unspecified: Secondary | ICD-10-CM

## 2020-01-20 DIAGNOSIS — Z23 Encounter for immunization: Secondary | ICD-10-CM | POA: Diagnosis not present

## 2020-01-20 DIAGNOSIS — E559 Vitamin D deficiency, unspecified: Secondary | ICD-10-CM

## 2020-01-20 DIAGNOSIS — E1122 Type 2 diabetes mellitus with diabetic chronic kidney disease: Secondary | ICD-10-CM | POA: Diagnosis not present

## 2020-01-20 DIAGNOSIS — I129 Hypertensive chronic kidney disease with stage 1 through stage 4 chronic kidney disease, or unspecified chronic kidney disease: Secondary | ICD-10-CM

## 2020-01-20 DIAGNOSIS — N182 Chronic kidney disease, stage 2 (mild): Secondary | ICD-10-CM | POA: Diagnosis not present

## 2020-01-20 DIAGNOSIS — L02215 Cutaneous abscess of perineum: Secondary | ICD-10-CM

## 2020-01-20 LAB — POCT URINALYSIS DIPSTICK
Bilirubin, UA: NEGATIVE
Glucose, UA: NEGATIVE
Ketones, UA: NEGATIVE
Leukocytes, UA: NEGATIVE
Nitrite, UA: NEGATIVE
Protein, UA: NEGATIVE
Spec Grav, UA: 1.015 (ref 1.010–1.025)
Urobilinogen, UA: 0.2 E.U./dL
pH, UA: 5.5 (ref 5.0–8.0)

## 2020-01-20 LAB — POCT UA - MICROALBUMIN
Albumin/Creatinine Ratio, Urine, POC: <30
Creatinine, POC: 100 mg/dL
Microalbumin Ur, POC: 10 mg/L

## 2020-01-20 MED ORDER — CEPHALEXIN 500 MG PO CAPS: 500.0000 mg | ORAL_CAPSULE | Freq: Three times a day (TID) | ORAL | 0 refills | Status: AC

## 2020-01-20 NOTE — Progress Notes (Signed)
I,Katawbba Wiggins,acting as a Education administrator for [name], MD.,have documented all relevant documentation on the behalf of [name], MD,as directed by  [name], MD while in the presence of [name], MD.  This visit occurred during the SARS-CoV-2 public health emergency.  Safety protocols were in place, including screening questions prior to the visit, additional usage of staff PPE, and extensive cleaning of exam room while observing appropriate contact time as indicated for disinfecting solutions.  Subjective:     Patient ID: Kelli Bond , female    DOB: October 19, 1950 , 69 y.o.   MRN: [id number]   Chief Complaint  Patient presents with  . Annual Exam  . Diabetes  . Hypertension    HPI  She is here today for a full physical examination. She has no specific concerns at this time. She reports compliance with meds. Admits she has been under a great deal of stress recently. Her husband has been diagnosed with cancer.   Diabetes She presents for her follow-up diabetic visit. She has type 2 diabetes mellitus. Her disease course has been stable. There are no hypoglycemic associated symptoms. Pertinent negatives for diabetes include no blurred vision and no chest pain. There are no hypoglycemic complications. Risk factors for coronary artery disease include diabetes mellitus, dyslipidemia, hypertension, obesity, sedentary lifestyle and post-menopausal. She is following a diabetic diet. She never participates in exercise. An ACE inhibitor/angiotensin II receptor blocker is being taken. Eye exam is not current.  Hypertension This is a chronic problem. The current episode started more than 1 year ago. The problem has been gradually improving since onset. The problem is controlled. Pertinent negatives include no blurred vision, chest pain, palpitations or shortness of breath. Risk factors for coronary artery disease include diabetes mellitus, dyslipidemia, post-menopausal state and  sedentary lifestyle. The current treatment provides moderate improvement.     Past Medical History:  Diagnosis Date  . Diabetes mellitus without complication ([place])   . Essential hypertension, benign 12/28/2013  . Hypertension   . Pure hypercholesterolemia 12/28/2013  . Thyroid disease      Family History  Problem Relation Age of Onset  . Hypertension Father   . Kidney disease Father   . Bradycardia Mother        pacemaker  . Breast cancer Sister   . Kidney disease Brother      Current Outpatient Medications:  .  aspirin EC 81 MG tablet, Take 81 mg by mouth daily., Disp: , Rfl:  .  cetirizine (ZYRTEC) 10 MG tablet, Take 10 mg by mouth daily as needed for allergies. prn, Disp: , Rfl:  .  cholecalciferol (VITAMIN D) 1000 UNITS tablet, Take 1,000 Units by mouth daily. , Disp: , Rfl:  .  JANUVIA 100 MG tablet, TAKE 1 TABLET BY MOUTH EVERY DAY, Disp: 30 tablet, Rfl: 1 .  Lancets (ONETOUCH DELICA PLUS [id number]) MISC, USE TO check blood sugar ONCE DAILY AS DIRECTED **100 DAY supply**, Disp: 100 each, Rfl: 1 .  latanoprost (XALATAN) 0.005 % ophthalmic solution, Place 1 drop into both eyes at bedtime., Disp: , Rfl:  .  ONETOUCH ULTRA test strip, USE TO check blood sugar ONCE DAILY **100 DAY supply**, Disp: 150 strip, Rfl: 1 .  rosuvastatin (CRESTOR) 20 MG tablet, TAKE 1 TABLET BY MOUTH EVERY DAY, Disp: 90 tablet, Rfl: 2 .  SYNTHROID 112 MCG tablet, TAKE 1 TABLET BY MOUTH EVERY DAY (Patient taking differently: 1 tablet Monday - Saturday, 1/2 tab on Sunday), Disp: 30  tablet, Rfl: 3 .  telmisartan-hydrochlorothiazide (MICARDIS HCT) 40-12.5 MG tablet, TAKE 1 TABLET BY MOUTH EVERY DAY, Disp: 90 tablet, Rfl: 2 .  docusate sodium (COLACE) 100 MG capsule, Take by mouth. (Patient not taking: Reported on 01/20/2020), Disp: , Rfl:  .  fluticasone (FLONASE) 50 MCG/ACT nasal spray, USE 2 SPRAYS IN EACH NOSTRIL EVERY DAY (Patient not taking: Reported on 10/01/2019), Disp: 16 g, Rfl: 2   Allergies   Allergen Reactions  . Banana Other (See Comments)    Tingling in mouth.  . Sulfa Antibiotics Rash      The patient states she uses post menopausal status for birth control. Last LMP was No LMP recorded. Patient has had a hysterectomy.. Negative for Dysmenorrhea. Negative for: breast discharge, breast lump(s), breast pain and breast self exam. Associated symptoms include abnormal vaginal bleeding. Pertinent negatives include abnormal bleeding (hematology), anxiety, decreased libido, depression, difficulty falling sleep, dyspareunia, history of infertility, nocturia, sexual dysfunction, sleep disturbances, urinary incontinence, urinary urgency, vaginal discharge and vaginal itching. Diet regular.The patient states her exercise level is  intermittent.   . The patient's tobacco use is:  Social History   Tobacco Use  Smoking Status Never Smoker  Smokeless Tobacco Never Used  . She has been exposed to passive smoke. The patient's alcohol use is:  Social History   Substance and Sexual Activity  Alcohol Use Not Currently    Review of Systems  Constitutional: Negative.   HENT: Negative.   Eyes: Negative.  Negative for blurred vision.  Respiratory: Negative.  Negative for shortness of breath.   Cardiovascular: Negative.  Negative for chest pain and palpitations.  Gastrointestinal: Negative.   Endocrine: Negative.   Genitourinary: Negative.   Musculoskeletal: Negative.   Skin: Positive for rash.       C/o boil in vaginal area. It is quite tender.   Allergic/Immunologic: Negative.   Neurological: Negative.   Hematological: Negative.   Psychiatric/Behavioral: Negative.      Today's Vitals   01/20/20 1415  BP: 132/60  Pulse: 80  Temp: 98.3 F (36.8 C)  TempSrc: Oral  Weight: 180 lb 12.8 oz (82 kg)  Height: 5' 3.8" (1.621 m)  PainSc: 2   PainLoc: Vagina   Body mass index is 31.23 kg/m.  Wt Readings from Last 3 Encounters:  01/20/20 180 lb 12.8 oz (82 kg)  11/18/19 186 lb 6.4  oz (84.6 kg)  10/01/19 186 lb 3.2 oz (84.5 kg)   Objective:  Physical Exam Constitutional:      General: She is not in acute distress.    Appearance: Normal appearance. She is well-developed.  HENT:     Head: Normocephalic and atraumatic.     Right Ear: Hearing, tympanic membrane, ear canal and external ear normal. There is no impacted cerumen.     Left Ear: Hearing, tympanic membrane, ear canal and external ear normal. There is no impacted cerumen.     Nose: Nose normal.     Mouth/Throat:     Mouth: Mucous membranes are dry.  Eyes:     General: Lids are normal.     Extraocular Movements: Extraocular movements intact.     Conjunctiva/sclera: Conjunctivae normal.     Pupils: Pupils are equal, round, and reactive to light.     Funduscopic exam:    Right eye: No papilledema.        Left eye: No papilledema.  Neck:     Thyroid: No thyroid mass.     Vascular: No carotid bruit.  Cardiovascular:       Rate and Rhythm: Normal rate and regular rhythm.     Pulses: Normal pulses.          Dorsalis pedis pulses are 2+ on the right side and 2+ on the left side.     Heart sounds: Normal heart sounds. No murmur heard.   Pulmonary:     Effort: Pulmonary effort is normal.     Breath sounds: Normal breath sounds.  Chest:     Breasts: Tanner Score is 5.        Right: Normal.        Left: Normal.  Abdominal:     General: Abdomen is flat. Bowel sounds are normal. There is no distension.     Palpations: Abdomen is soft.     Tenderness: There is no abdominal tenderness.  Genitourinary:    General: Normal vulva.     Exam position: Lithotomy position.       Comments: Area of erythematous papule, tender to palpation. No drainage.  Musculoskeletal:        General: No swelling. Normal range of motion.     Cervical back: Full passive range of motion without pain, normal range of motion and neck supple.     Right lower leg: No edema.     Left lower leg: No edema.  Feet:     Right foot:      Protective Sensation: 5 sites tested. 5 sites sensed.     Skin integrity: Skin integrity normal.     Toenail Condition: Right toenails are normal.     Left foot:     Protective Sensation: 5 sites tested. 5 sites sensed.     Skin integrity: Skin integrity normal.     Toenail Condition: Left toenails are normal.  Lymphadenopathy:     Lower Body: No right inguinal adenopathy. No left inguinal adenopathy.  Skin:    General: Skin is warm and dry.     Capillary Refill: Capillary refill takes less than 2 seconds.  Neurological:     General: No focal deficit present.     Mental Status: She is alert and oriented to person, place, and time.     Cranial Nerves: No cranial nerve deficit.     Sensory: No sensory deficit.  Psychiatric:        Mood and Affect: Mood normal.        Behavior: Behavior normal.        Thought Content: Thought content normal.        Judgment: Judgment normal.         Assessment And Plan:     1. Routine general medical examination at health care facility Comments: A full exam was performed.  Importance of monthly self breast exams was discussed with the patient. PATIENT IS ADVISED TO GET 30-45 MINUTES REGULAR EXERCISE NO LESS THAN FOUR TO FIVE DAYS PER WEEK - BOTH WEIGHTBEARING EXERCISES AND AEROBIC ARE RECOMMENDED.  PATIENT IS ADVISED TO FOLLOW A HEALTHY DIET WITH AT LEAST SIX FRUITS/VEGGIES PER DAY, DECREASE INTAKE OF RED MEAT, AND TO INCREASE FISH INTAKE TO TWO DAYS PER WEEK.  MEATS/FISH SHOULD NOT BE FRIED, BAKED OR BROILED IS PREFERABLE.  I SUGGEST WEARING SPF 50 SUNSCREEN ON EXPOSED PARTS AND ESPECIALLY WHEN IN THE DIRECT SUNLIGHT FOR AN EXTENDED PERIOD OF TIME.  PLEASE AVOID FAST FOOD RESTAURANTS AND INCREASE YOUR WATER INTAKE.   2. Type 2 diabetes mellitus with stage 2 chronic kidney disease, without long-term current use of insulin ([facility]) Comments: Diabetic foot exam was   performed. I DISCUSSED WITH THE PATIENT AT LENGTH REGARDING THE GOALS OF GLYCEMIC CONTROL AND  POSSIBLE LONG-TERM COMPLICATIONS.  I  ALSO STRESSED THE IMPORTANCE OF COMPLIANCE WITH HOME GLUCOSE MONITORING, DIETARY RESTRICTIONS INCLUDING AVOIDANCE OF SUGARY DRINKS/PROCESSED FOODS,  ALONG WITH REGULAR EXERCISE.  I  ALSO STRESSED THE IMPORTANCE OF ANNUAL EYE EXAMS, SELF FOOT CARE AND COMPLIANCE WITH OFFICE VISITS.  - POCT Urinalysis Dipstick (81002) - POCT UA - Microalbumin - Hemoglobin A1c - CBC - BMP8+EGFR  3. Hypertensive nephropathy Comments: Chronic, fair control. She will continue with current meds. She is encouraged to avoid adding salt to her foods. EKG performed, NSR w/ nonspecific T abnormality. She will rto in six months for re-evaluation.  - EKG 12-Lead  4. Primary hypothyroidism Comments: Chronic. Previous results reviewed, TSH within normal limits July 2021. She will continue with current meds.   5. Perineal abscess, superficial Comments: She was given rx cephalexin and encouraged to complete full abx course. Advised to soak in sitz bathes as well. She will let me know if her sx worsen.  6. Vitamin D deficiency disease Comments: I will check a vitamin D level and supplement as needed.  - VITAMIN D 25 Hydroxy (Vit-D Deficiency, Fractures)  7. Class 1 obesity due to excess calories with serious comorbidity and body mass index (BMI) of 33.0 to 33.9 in adult Comments: She is encouraged to strive for BMI less than 30 to decrease cardiac risk. Advised to aim for at least 150 minutes of exercise per week.   8. Immunization due Comments: She was given high dose flu vaccine to update her immunization history. - Flu Vaccine QUAD High Dose(Fluad)     Patient was given opportunity to ask questions. Patient verbalized understanding of the plan and was able to repeat key elements of the plan. All questions were answered to their satisfaction.   [name], MD   I, [name], MD, have reviewed all documentation for this visit. The documentation on 02/13/20 for the  exam, diagnosis, procedures, and orders are all accurate and complete.  THE PATIENT IS ENCOURAGED TO PRACTICE SOCIAL DISTANCING DUE TO THE COVID-19 PANDEMIC.

## 2020-01-20 NOTE — Patient Instructions (Signed)
Health Maintenance After Age 68 After age 68, you are at a higher risk for certain long-term diseases and infections as well as injuries from falls. Falls are a major cause of broken bones and head injuries in people who are older than age 68. Getting regular preventive care can help to keep you healthy and well. Preventive care includes getting regular testing and making lifestyle changes as recommended by your health care provider. Talk with your health care provider about:  Which screenings and tests you should have. A screening is a test that checks for a disease when you have no symptoms.  A diet and exercise plan that is right for you. What should I know about screenings and tests to prevent falls? Screening and testing are the best ways to find a health problem early. Early diagnosis and treatment give you the best chance of managing medical conditions that are common after age 68. Certain conditions and lifestyle choices may make you more likely to have a fall. Your health care provider may recommend:  Regular vision checks. Poor vision and conditions such as cataracts can make you more likely to have a fall. If you wear glasses, make sure to get your prescription updated if your vision changes.  Medicine review. Work with your health care provider to regularly review all of the medicines you are taking, including over-the-counter medicines. Ask your health care provider about any side effects that may make you more likely to have a fall. Tell your health care provider if any medicines that you take make you feel dizzy or sleepy.  Osteoporosis screening. Osteoporosis is a condition that causes the bones to get weaker. This can make the bones weak and cause them to break more easily.  Blood pressure screening. Blood pressure changes and medicines to control blood pressure can make you feel dizzy.  Strength and balance checks. Your health care provider may recommend certain tests to check your  strength and balance while standing, walking, or changing positions.  Foot health exam. Foot pain and numbness, as well as not wearing proper footwear, can make you more likely to have a fall.  Depression screening. You may be more likely to have a fall if you have a fear of falling, feel emotionally low, or feel unable to do activities that you used to do.  Alcohol use screening. Using too much alcohol can affect your balance and may make you more likely to have a fall. What actions can I take to lower my risk of falls? General instructions  Talk with your health care provider about your risks for falling. Tell your health care provider if: ? You fall. Be sure to tell your health care provider about all falls, even ones that seem minor. ? You feel dizzy, sleepy, or off-balance.  Take over-the-counter and prescription medicines only as told by your health care provider. These include any supplements.  Eat a healthy diet and maintain a healthy weight. A healthy diet includes low-fat dairy products, low-fat (lean) meats, and fiber from whole grains, beans, and lots of fruits and vegetables. Home safety  Remove any tripping hazards, such as rugs, cords, and clutter.  Install safety equipment such as grab bars in bathrooms and safety rails on stairs.  Keep rooms and walkways well-lit. Activity   Follow a regular exercise program to stay fit. This will help you maintain your balance. Ask your health care provider what types of exercise are appropriate for you.  If you need a cane or   walker, use it as recommended by your health care provider.  Wear supportive shoes that have nonskid soles. Lifestyle  Do not drink alcohol if your health care provider tells you not to drink.  If you drink alcohol, limit how much you have: ? 0-1 drink a day for women. ? 0-2 drinks a day for men.  Be aware of how much alcohol is in your drink. In the [place] one drink equals one typical bottle of beer (12  oz), one-half glass of wine (5 oz), or one shot of hard liquor (1 oz).  Do not use any products that contain nicotine or tobacco, such as cigarettes and e-cigarettes. If you need help quitting, ask your health care provider. Summary  Having a healthy lifestyle and getting preventive care can help to protect your health and wellness after age 68.  Screening and testing are the best way to find a health problem early and help you avoid having a fall. Early diagnosis and treatment give you the best chance for managing medical conditions that are more common for people who are older than age 68.  Falls are a major cause of broken bones and head injuries in people who are older than age 68. Take precautions to prevent a fall at home.  Work with your health care provider to learn what changes you can make to improve your health and wellness and to prevent falls. This information is not intended to replace advice given to you by your health care provider. Make sure you discuss any questions you have with your health care provider. Document Revised: 08/14/2018 Document Reviewed: 03/06/2017 [employer] Patient Education  2020 [employer]  

## 2020-01-21 LAB — BMP8+EGFR
BUN/Creatinine Ratio: 9 — ABNORMAL LOW (ref 12–28)
BUN: 10 mg/dL (ref 8–27)
CO2: 27 mmol/L (ref 20–29)
Calcium: 10 mg/dL (ref 8.7–10.3)
Chloride: 96 mmol/L (ref 96–106)
Creatinine, Ser: 1.08 mg/dL — ABNORMAL HIGH (ref 0.57–1.00)
GFR calc Af Amer: 61 mL/min/{1.73_m2} (ref >59)
GFR calc non Af Amer: 53 mL/min/{1.73_m2} — ABNORMAL LOW (ref >59)
Glucose: 84 mg/dL (ref 65–99)
Potassium: 4.6 mmol/L (ref 3.5–5.2)
Sodium: 138 mmol/L (ref 134–144)

## 2020-01-21 LAB — CBC
Hematocrit: 42.7 % (ref 34.0–46.6)
Hemoglobin: 14 g/dL (ref 11.1–15.9)
MCH: 26.7 pg (ref 26.6–33.0)
MCHC: 32.8 g/dL (ref 31.5–35.7)
MCV: 81 fL (ref 79–97)
Platelets: 260 10*3/uL (ref 150–450)
RBC: 5.25 x10E6/uL (ref 3.77–5.28)
RDW: 13.9 % (ref 11.7–15.4)
WBC: 6.6 10*3/uL (ref 3.4–10.8)

## 2020-01-21 LAB — HEMOGLOBIN A1C
Est. average glucose Bld gHb Est-mCnc: 134 mg/dL
Hgb A1c MFr Bld: 6.3 % — ABNORMAL HIGH (ref 4.8–5.6)

## 2020-01-21 LAB — VITAMIN D 25 HYDROXY (VIT D DEFICIENCY, FRACTURES): Vit D, 25-Hydroxy: 43.8 ng/mL (ref 30.0–100.0)

## 2020-01-24 ENCOUNTER — Encounter: Payer: Self-pay | Admitting: Internal Medicine

## 2020-02-27 ENCOUNTER — Other Ambulatory Visit: Payer: Self-pay | Admitting: Internal Medicine

## 2020-05-05 ENCOUNTER — Other Ambulatory Visit: Payer: Self-pay | Admitting: Internal Medicine

## 2020-05-11 ENCOUNTER — Other Ambulatory Visit: Payer: Self-pay | Admitting: Internal Medicine

## 2020-05-26 ENCOUNTER — Ambulatory Visit (INDEPENDENT_AMBULATORY_CARE_PROVIDER_SITE_OTHER): Payer: Medicare Other | Admitting: Internal Medicine

## 2020-05-26 ENCOUNTER — Encounter: Payer: Self-pay | Admitting: Internal Medicine

## 2020-05-26 ENCOUNTER — Other Ambulatory Visit: Payer: Self-pay

## 2020-05-26 ENCOUNTER — Ambulatory Visit (INDEPENDENT_AMBULATORY_CARE_PROVIDER_SITE_OTHER): Payer: Medicare Other

## 2020-05-26 VITALS — BP 126/78 | HR 72 | Temp 98.0°F | Ht 63.8 in | Wt 184.0 lb

## 2020-05-26 DIAGNOSIS — E039 Hypothyroidism, unspecified: Secondary | ICD-10-CM | POA: Diagnosis not present

## 2020-05-26 DIAGNOSIS — E1122 Type 2 diabetes mellitus with diabetic chronic kidney disease: Secondary | ICD-10-CM | POA: Diagnosis not present

## 2020-05-26 DIAGNOSIS — I129 Hypertensive chronic kidney disease with stage 1 through stage 4 chronic kidney disease, or unspecified chronic kidney disease: Secondary | ICD-10-CM

## 2020-05-26 DIAGNOSIS — Z0000 Encounter for general adult medical examination without abnormal findings: Secondary | ICD-10-CM | POA: Diagnosis not present

## 2020-05-26 DIAGNOSIS — E6609 Other obesity due to excess calories: Secondary | ICD-10-CM

## 2020-05-26 DIAGNOSIS — Z6831 Body mass index (BMI) 31.0-31.9, adult: Secondary | ICD-10-CM

## 2020-05-26 DIAGNOSIS — N182 Chronic kidney disease, stage 2 (mild): Secondary | ICD-10-CM

## 2020-05-26 NOTE — Patient Instructions (Signed)

## 2020-05-26 NOTE — Progress Notes (Signed)
This visit occurred during the SARS-CoV-2 public health emergency.  Safety protocols were in place, including screening questions prior to the visit, additional usage of staff PPE, and extensive cleaning of exam room while observing appropriate contact time as indicated for disinfecting solutions.  Subjective:   Kelli Bond is a 70 y.o. female who presents for Medicare Annual (Subsequent) preventive examination.  Review of Systems     Cardiac Risk Factors include: advanced age (>65men, >64 women);diabetes mellitus;dyslipidemia;obesity (BMI >30kg/m2);sedentary lifestyle     Objective:    Today's Vitals   05/26/20 1212  BP: 126/78  Pulse: 72  Temp: 98 F (36.7 C)  TempSrc: Oral  Weight: 184 lb (83.5 kg)  Height: 5' 3.8" (1.621 m)   Body mass index is 31.78 kg/m.  Advanced Directives 05/26/2020 10/01/2019 09/16/2018 10/10/2017  Does Patient Have a Medical Advance Directive? No No No No  Would patient like information on creating a medical advance directive? No - Patient declined No - Patient declined - -    Current Medications (verified) Outpatient Encounter Medications as of 05/26/2020  Medication Sig  . aspirin EC 81 MG tablet Take 81 mg by mouth daily.  . cetirizine (ZYRTEC) 10 MG tablet Take 10 mg by mouth daily as needed for allergies. prn  . cholecalciferol (VITAMIN D) 1000 UNITS tablet Take 1,000 Units by mouth daily.   Marland Kitchen docusate sodium (COLACE) 100 MG capsule Take by mouth.  . fluticasone (FLONASE) 50 MCG/ACT nasal spray USE 2 SPRAYS IN EACH NOSTRIL EVERY DAY  . JANUVIA 100 MG tablet TAKE 1 TABLET BY MOUTH EVERY DAY  . Lancets (ONETOUCH DELICA PLUS LANCET33G) MISC USE TO check blood sugar ONCE DAILY AS DIRECTED **100 DAY supply**  . latanoprost (XALATAN) 0.005 % ophthalmic solution Place 1 drop into both eyes at bedtime.  Letta Pate ULTRA test strip USE TO check blood sugar ONCE DAILY **100 DAY supply**  . rosuvastatin (CRESTOR) 20 MG tablet TAKE 1 TABLET BY MOUTH EVERY  DAY  . SYNTHROID 112 MCG tablet TAKE 1 TABLET BY MOUTH MONDAY-SATURDAY AND 1/2 TABLET ON SUNDAY  . telmisartan-hydrochlorothiazide (MICARDIS HCT) 40-12.5 MG tablet TAKE 1 TABLET BY MOUTH EVERY DAY   No facility-administered encounter medications on file as of 05/26/2020.    Allergies (verified) Banana and Sulfa antibiotics   History: Past Medical History:  Diagnosis Date  . Diabetes mellitus without complication (HCC)   . Essential hypertension, benign 12/28/2013  . Hypertension   . Pure hypercholesterolemia 12/28/2013  . Thyroid disease    Past Surgical History:  Procedure Laterality Date  . ABDOMINAL HYSTERECTOMY    . ORIF ANKLE FRACTURE Right 10/10/2017   Procedure: OPEN REDUCTION INTERNAL FIXATION (ORIF) ANKLE FRACTURE;  Surgeon: Kennedy Bucker, MD;  Location: [facility] ORS;  Service: Orthopedics;  Laterality: Right;   Family History  Problem Relation Age of Onset  . Hypertension Father   . Kidney disease Father   . Bradycardia Mother        pacemaker  . Breast cancer Sister   . Kidney disease Brother    Social History   Socioeconomic History  . Marital status: Married    Spouse name: Not on file  . Number of children: Not on file  . Years of education: Not on file  . Highest education level: Not on file  Occupational History  . Not on file  Tobacco Use  . Smoking status: Never Smoker  . Smokeless tobacco: Never Used  Vaping Use  . Vaping Use: Never used  Substance and Sexual Activity  . Alcohol use: Not Currently  . Drug use: Never  . Sexual activity: Yes  Other Topics Concern  . Not on file  Social History Narrative  . Not on file   Social Determinants of Health   Financial Resource Strain: Low Risk   . Difficulty of Paying Living Expenses: Not hard at all  Food Insecurity: No Food Insecurity  . Worried About Programme researcher, broadcasting/film/videounning Out of Food in the Last Year: Never true  . Ran Out of Food in the Last Year: Never true  Transportation Needs: No Transportation Needs  .  Lack of Transportation (Medical): No  . Lack of Transportation (Non-Medical): No  Physical Activity: Inactive  . Days of Exercise per Week: 0 days  . Minutes of Exercise per Session: 0 min  Stress: No Stress Concern Present  . Feeling of Stress : Not at all  Social Connections: Not on file    Tobacco Counseling Counseling given: Not Answered   Clinical Intake:  Pre-visit preparation completed: Yes  Pain : No/denies pain     Nutritional Status: BMI > 30  Obese Nutritional Risks: None Diabetes: Yes  How often do you need to have someone help you when you read instructions, pamphlets, or other written materials from your doctor or pharmacy?: 1 - Never What is the last grade level you completed in school?: master's degree  Diabetic? Yes Nutrition Risk Assessment:  Has the patient had any N/V/D within the last 2 months?  No  Does the patient have any non-healing wounds?  No  Has the patient had any unintentional weight loss or weight gain?  No   Diabetes:  Is the patient diabetic?  Yes  If diabetic, was a CBG obtained today?  No  Did the patient bring in their glucometer from home?  No  How often do you monitor your CBG's? Once week.   Financial Strains and Diabetes Management:  Are you having any financial strains with the device, your supplies or your medication? No .  Does the patient want to be seen by Chronic Care Management for management of their diabetes?  No  Would the patient like to be referred to a Nutritionist or for Diabetic Management?  No   Diabetic Exams:  Diabetic Eye Exam: Patient had eye exam on 05/12/2020, results pending. Diabetic Foot Exam: Completed 01/20/2020   Interpreter Needed?: No  Information entered by :: NAllen LPN   Activities of Daily Living In your present state of health, do you have any difficulty performing the following activities: 05/26/2020 10/01/2019  Hearing? N N  Vision? Y N  Comment little cloudy -  Difficulty  concentrating or making decisions? N N  Walking or climbing stairs? N N  Comment - just needs to be careful  Dressing or bathing? N N  Doing errands, shopping? N N  Preparing Food and eating ? N N  Using the Toilet? N N  In the past six months, have you accidently leaked urine? N N  Do you have problems with loss of bowel control? N N  Managing your Medications? N N  Managing your Finances? N N  Housekeeping or managing your Housekeeping? N N  Some recent data might be hidden    Patient Care Team: Dorothyann PengSanders, Robyn, MD as PCP - General (Internal Medicine)  Indicate any recent Medical Services you may have received from other than [facility] providers in the past year (date may be approximate).     Assessment:  This is a routine wellness examination for [place].  Hearing/Vision screen No exam data present  Dietary issues and exercise activities discussed: Current Exercise Habits: The patient does not participate in regular exercise at present  Goals    . Patient Stated     10/01/2019, wants to exercise more (would like to start water aerobics)    . Patient Stated     05/26/2020, wants to lose 5-10 pounds    . Weight (lb) < 200 lb (90.7 kg)     Would like to lose 20 pounds      Depression Screen PHQ 2/9 Scores 05/26/2020 01/20/2020 10/01/2019 04/15/2019 12/11/2018 09/16/2018 08/13/2018  PHQ - 2 Score 0 0 0 0 0 0 0  PHQ- 9 Score - - 0 - - 0 -    Fall Risk Fall Risk  05/26/2020 10/01/2019 04/15/2019 12/11/2018 09/16/2018  Falls in the past year? 0 0 0 0 0  Number falls in past yr: - - - - -  Injury with Fall? - - - - -  Risk for fall due to : Medication side effect Medication side effect - - Medication side effect  Follow up Falls evaluation completed;Education provided;Falls prevention discussed Falls evaluation completed;Education provided;Falls prevention discussed - - -    FALL RISK PREVENTION PERTAINING TO THE HOME:  Any stairs in or around the home? Yes  If so, are there any without  handrails? No  Home free of loose throw rugs in walkways, pet beds, electrical cords, etc? Yes  Adequate lighting in your home to reduce risk of falls? Yes   ASSISTIVE DEVICES UTILIZED TO PREVENT FALLS:  Life alert? No  Use of a cane, walker or w/c? No  Grab bars in the bathroom? No  Shower chair or bench in shower? Yes  Elevated toilet seat or a handicapped toilet? Yes   TIMED UP AND GO:  Was the test performed? No .   Gait steady and fast without use of assistive device  Cognitive Function:     6CIT Screen 05/26/2020 10/01/2019 09/16/2018  What Year? 0 points 0 points 0 points  What month? 0 points 0 points 0 points  What time? 0 points 0 points 0 points  Count back from 20 0 points 0 points 0 points  Months in reverse 0 points 0 points 0 points  Repeat phrase 4 points 0 points 0 points  Total Score 4 0 0    Immunizations Immunization History  Administered Date(s) Administered  . Fluad Quad(high Dose 65+) 01/20/2020  . Influenza, High Dose Seasonal PF 02/04/2018  . Influenza,inj,Quad PF,6+ Mos 03/14/2017  . Influenza-Unspecified 02/10/2019  . PFIZER(Purple Top)SARS-COV-2 Vaccination 05/09/2019, 05/29/2019, 02/19/2020  . Pneumococcal Conjugate-13 09/16/2018  . Pneumococcal Polysaccharide-23 07/20/2014  . Tdap 12/31/2018    TDAP status: Up to date  Flu Vaccine status: Up to date  Pneumococcal vaccine status: Up to date  Covid-19 vaccine status: Completed vaccines  Qualifies for Shingles Vaccine? Yes   Zostavax completed No   Shingrix Completed?: No.    Education has been provided regarding the importance of this vaccine. Patient has been advised to call insurance company to determine out of pocket expense if they have not yet received this vaccine. Advised may also receive vaccine at local pharmacy or Health Dept. Verbalized acceptance and understanding.  Screening Tests Health Maintenance  Topic Date Due  . OPHTHALMOLOGY EXAM  02/04/2020  . HEMOGLOBIN A1C   07/19/2020  . FOOT EXAM  01/19/2021  . MAMMOGRAM  06/10/2021  .  COLONOSCOPY (Pts 45-38yrs Insurance coverage will need to be confirmed)  12/19/2022  . TETANUS/TDAP  12/31/2028  . INFLUENZA VACCINE  Completed  . DEXA SCAN  Completed  . COVID-19 Vaccine  Completed  . Hepatitis C Screening  Completed  . PNA vac Low Risk Adult  Completed    Health Maintenance  Health Maintenance Due  Topic Date Due  . OPHTHALMOLOGY EXAM  02/04/2020    Colorectal cancer screening: Type of screening: Colonoscopy. Completed 12/18/2012. Repeat every 10 years  Mammogram status: Completed 06/16/2019. Repeat every year  Bone Density status: Completed 06/05/2018. Results reflect: Bone density results: OSTEOPENIA. Repeat every 3 years.  Lung Cancer Screening: (Low Dose CT Chest recommended if Age 73-80 years, 30 pack-year currently smoking OR have quit w/in 15years.) does not qualify.   Lung Cancer Screening Referral: no  Additional Screening:  Hepatitis C Screening: does qualify; Completed 10/18/2015  Vision Screening: Recommended annual ophthalmology exams for early detection of glaucoma and other disorders of the eye. Is the patient up to date with their annual eye exam?  Yes  Who is the provider or what is the name of the office in which the patient attends annual eye exams? Dr. Harlon Flor If pt is not established with a provider, would they like to be referred to a provider to establish care? No .   Dental Screening: Recommended annual dental exams for proper oral hygiene  Community Resource Referral / Chronic Care Management: CRR required this visit?  No   CCM required this visit?  No      Plan:     I have personally reviewed and noted the following in the patient's chart:   . Medical and social history . Use of alcohol, tobacco or illicit drugs  . Current medications and supplements . Functional ability and status . Nutritional status . Physical activity . Advanced directives . List of  other physicians . Hospitalizations, surgeries, and ER visits in previous 12 months . Vitals . Screenings to include cognitive, depression, and falls . Referrals and appointments  In addition, I have reviewed and discussed with patient certain preventive protocols, quality metrics, and best practice recommendations. A written personalized care plan for preventive services as well as general preventive health recommendations were provided to patient.     Barb Merino, LPN   [id number]   Nurse Notes:

## 2020-05-26 NOTE — Patient Instructions (Signed)
[name] , Thank you for taking time to come for your Medicare Wellness Visit. I appreciate your ongoing commitment to your health goals. Please review the following plan we discussed and let me know if I can assist you in the future.   Screening recommendations/referrals: Colonoscopy: completed 12/18/2012, due 12/19/2022 Mammogram: completed 06/16/2019, due 06/15/2020 Bone Density: completed 06/05/2018 Recommended yearly ophthalmology/optometry visit for glaucoma screening and checkup Recommended yearly dental visit for hygiene and checkup  Vaccinations: Influenza vaccine: completed 01/20/2020 Pneumococcal vaccine: completed 09/16/2018 Tdap vaccine: completed 12/31/2018, due 12/30/2028 Shingles vaccine: discussed   Covid-19: 05/09/2019, 05/29/2019, 02/19/2020  Advanced directives: Advance directive discussed with you today. Even though you declined this today please call our office should you change your mind and we can give you the proper paperwork for you to fill out.  Conditions/risks identified: none  Next appointment: Follow up in one year for your annual wellness visit    Preventive Care 65 Years and Older, Female Preventive care refers to lifestyle choices and visits with your health care provider that can promote health and wellness. What does preventive care include?  A yearly physical exam. This is also called an annual well check.  Dental exams once or twice a year.  Routine eye exams. Ask your health care provider how often you should have your eyes checked.  Personal lifestyle choices, including:  Daily care of your teeth and gums.  Regular physical activity.  Eating a healthy diet.  Avoiding tobacco and drug use.  Limiting alcohol use.  Practicing safe sex.  Taking low-dose aspirin every day.  Taking vitamin and mineral supplements as recommended by your health care provider. What happens during an annual well check? The services and screenings done by your  health care provider during your annual well check will depend on your age, overall health, lifestyle risk factors, and family history of disease. Counseling  Your health care provider may ask you questions about your:  Alcohol use.  Tobacco use.  Drug use.  Emotional well-being.  Home and relationship well-being.  Sexual activity.  Eating habits.  History of falls.  Memory and ability to understand (cognition).  Work and work Astronomer.  Reproductive health. Screening  You may have the following tests or measurements:  Height, weight, and BMI.  Blood pressure.  Lipid and cholesterol levels. These may be checked every 5 years, or more frequently if you are over 5 years old.  Skin check.  Lung cancer screening. You may have this screening every year starting at age 75 if you have a 30-pack-year history of smoking and currently smoke or have quit within the past 15 years.  Fecal occult blood test (FOBT) of the stool. You may have this test every year starting at age 62.  Flexible sigmoidoscopy or colonoscopy. You may have a sigmoidoscopy every 5 years or a colonoscopy every 10 years starting at age 63.  Hepatitis C blood test.  Hepatitis B blood test.  Sexually transmitted disease (STD) testing.  Diabetes screening. This is done by checking your blood sugar (glucose) after you have not eaten for a while (fasting). You may have this done every 1-3 years.  Bone density scan. This is done to screen for osteoporosis. You may have this done starting at age 81.  Mammogram. This may be done every 1-2 years. Talk to your health care provider about how often you should have regular mammograms. Talk with your health care provider about your test results, treatment options, and if necessary, the  need for more tests. Vaccines  Your health care provider may recommend certain vaccines, such as:  Influenza vaccine. This is recommended every year.  Tetanus, diphtheria, and  acellular pertussis (Tdap, Td) vaccine. You may need a Td booster every 10 years.  Zoster vaccine. You may need this after age 43.  Pneumococcal 13-valent conjugate (PCV13) vaccine. One dose is recommended after age 82.  Pneumococcal polysaccharide (PPSV23) vaccine. One dose is recommended after age 25. Talk to your health care provider about which screenings and vaccines you need and how often you need them. This information is not intended to replace advice given to you by your health care provider. Make sure you discuss any questions you have with your health care provider. Document Released: 05/20/2015 Document Revised: 01/11/2016 Document Reviewed: 02/22/2015 Elsevier Interactive Patient Education  2017 [employer] Prevention in the Home Falls can cause injuries. They can happen to people of all ages. There are many things you can do to make your home safe and to help prevent falls. What can I do on the outside of my home?  Regularly fix the edges of walkways and driveways and fix any cracks.  Remove anything that might make you trip as you walk through a door, such as a raised step or threshold.  Trim any bushes or trees on the path to your home.  Use bright outdoor lighting.  Clear any walking paths of anything that might make someone trip, such as rocks or tools.  Regularly check to see if handrails are loose or broken. Make sure that both sides of any steps have handrails.  Any raised decks and porches should have guardrails on the edges.  Have any leaves, snow, or ice cleared regularly.  Use sand or salt on walking paths during winter.  Clean up any spills in your garage right away. This includes oil or grease spills. What can I do in the bathroom?  Use night lights.  Install grab bars by the toilet and in the tub and shower. Do not use towel bars as grab bars.  Use non-skid mats or decals in the tub or shower.  If you need to sit down in the shower, use  a plastic, non-slip stool.  Keep the floor dry. Clean up any water that spills on the floor as soon as it happens.  Remove soap buildup in the tub or shower regularly.  Attach bath mats securely with double-sided non-slip rug tape.  Do not have throw rugs and other things on the floor that can make you trip. What can I do in the bedroom?  Use night lights.  Make sure that you have a light by your bed that is easy to reach.  Do not use any sheets or blankets that are too big for your bed. They should not hang down onto the floor.  Have a firm chair that has side arms. You can use this for support while you get dressed.  Do not have throw rugs and other things on the floor that can make you trip. What can I do in the kitchen?  Clean up any spills right away.  Avoid walking on wet floors.  Keep items that you use a lot in easy-to-reach places.  If you need to reach something above you, use a strong step stool that has a grab bar.  Keep electrical cords out of the way.  Do not use floor polish or wax that makes floors slippery. If you must use wax,  use non-skid floor wax.  Do not have throw rugs and other things on the floor that can make you trip. What can I do with my stairs?  Do not leave any items on the stairs.  Make sure that there are handrails on both sides of the stairs and use them. Fix handrails that are broken or loose. Make sure that handrails are as long as the stairways.  Check any carpeting to make sure that it is firmly attached to the stairs. Fix any carpet that is loose or worn.  Avoid having throw rugs at the top or bottom of the stairs. If you do have throw rugs, attach them to the floor with carpet tape.  Make sure that you have a light switch at the top of the stairs and the bottom of the stairs. If you do not have them, ask someone to add them for you. What else can I do to help prevent falls?  Wear shoes that:  Do not have high heels.  Have  rubber bottoms.  Are comfortable and fit you well.  Are closed at the toe. Do not wear sandals.  If you use a stepladder:  Make sure that it is fully opened. Do not climb a closed stepladder.  Make sure that both sides of the stepladder are locked into place.  Ask someone to hold it for you, if possible.  Clearly mark and make sure that you can see:  Any grab bars or handrails.  First and last steps.  Where the edge of each step is.  Use tools that help you move around (mobility aids) if they are needed. These include:  Canes.  Walkers.  Scooters.  Crutches.  Turn on the lights when you go into a dark area. Replace any light bulbs as soon as they burn out.  Set up your furniture so you have a clear path. Avoid moving your furniture around.  If any of your floors are uneven, fix them.  If there are any pets around you, be aware of where they are.  Review your medicines with your doctor. Some medicines can make you feel dizzy. This can increase your chance of falling. Ask your doctor what other things that you can do to help prevent falls. This information is not intended to replace advice given to you by your health care provider. Make sure you discuss any questions you have with your health care provider. Document Released: 02/17/2009 Document Revised: 09/29/2015 Document Reviewed: 05/28/2014 Elsevier Interactive Patient Education  2017 [employer].

## 2020-05-26 NOTE — Progress Notes (Signed)
I,Katawbba Wiggins,acting as a Education administrator for [name], MD.,have documented all relevant documentation on the behalf of [name], MD,as directed by  [name], MD while in the presence of [name], MD.  This visit occurred during the SARS-CoV-2 public health emergency.  Safety protocols were in place, including screening questions prior to the visit, additional usage of staff PPE, and extensive cleaning of exam room while observing appropriate contact time as indicated for disinfecting solutions.  Subjective:     Patient ID: Kelli Bond , female    DOB: May 27, 1950 , 70 y.o.   MRN: [id number]   Chief Complaint  Patient presents with  . Diabetes  . Hypertension  . Hypothyroidism    HPI  She is here today for diabetes, blood pressure, and thyroid f/u.  She reports compliance with meds. Denies headaches, chest pain and shortness of breath. Admits she is not exercising as she should.   Diabetes She presents for her follow-up diabetic visit. She has type 2 diabetes mellitus. Her disease course has been stable. There are no hypoglycemic associated symptoms. Pertinent negatives for diabetes include no blurred vision and no chest pain. There are no hypoglycemic complications. Risk factors for coronary artery disease include diabetes mellitus, dyslipidemia, hypertension, obesity, sedentary lifestyle and post-menopausal. She participates in exercise intermittently. Eye exam is current.  Hypertension This is a chronic problem. The current episode started more than 1 year ago. The problem has been gradually improving since onset. The problem is controlled. Pertinent negatives include no blurred vision, chest pain, palpitations or shortness of breath. Risk factors for coronary artery disease include diabetes mellitus, dyslipidemia, post-menopausal state and sedentary lifestyle.     Past Medical History:  Diagnosis Date  . Diabetes mellitus without complication ([place])   .  Essential hypertension, benign 12/28/2013  . Hypertension   . Pure hypercholesterolemia 12/28/2013  . Thyroid disease      Family History  Problem Relation Age of Onset  . Hypertension Father   . Kidney disease Father   . Bradycardia Mother        pacemaker  . Breast cancer Sister   . Kidney disease Brother      Current Outpatient Medications:  .  aspirin EC 81 MG tablet, Take 81 mg by mouth daily., Disp: , Rfl:  .  cetirizine (ZYRTEC) 10 MG tablet, Take 10 mg by mouth daily as needed for allergies. prn, Disp: , Rfl:  .  cholecalciferol (VITAMIN D) 1000 UNITS tablet, Take 1,000 Units by mouth daily. , Disp: , Rfl:  .  docusate sodium (COLACE) 100 MG capsule, Take by mouth., Disp: , Rfl:  .  fluticasone (FLONASE) 50 MCG/ACT nasal spray, USE 2 SPRAYS IN EACH NOSTRIL EVERY DAY, Disp: 16 g, Rfl: 2 .  JANUVIA 100 MG tablet, TAKE 1 TABLET BY MOUTH EVERY DAY, Disp: 30 tablet, Rfl: 1 .  latanoprost (XALATAN) 0.005 % ophthalmic solution, Place 1 drop into both eyes at bedtime., Disp: , Rfl:  .  ONETOUCH ULTRA test strip, USE TO check blood sugar ONCE DAILY **100 DAY supply**, Disp: 150 strip, Rfl: 1 .  rosuvastatin (CRESTOR) 20 MG tablet, TAKE 1 TABLET BY MOUTH EVERY DAY, Disp: 90 tablet, Rfl: 1 .  SYNTHROID 112 MCG tablet, TAKE 1 TABLET BY MOUTH MONDAY-SATURDAY AND 1/2 TABLET ON SUNDAY, Disp: 30 tablet, Rfl: 1 .  telmisartan-hydrochlorothiazide (MICARDIS HCT) 40-12.5 MG tablet, TAKE 1 TABLET BY MOUTH EVERY DAY, Disp: 90 tablet, Rfl: 1 .  Lancets [facility]  DELICA PLUS [id number]) MISC, USE TO check blood sugar ONCE DAILY AS DIRECTED **100 DAY supply**, Disp: 100 each, Rfl: 1   Allergies  Allergen Reactions  . Banana Other (See Comments)    Tingling in mouth.  . Sulfa Antibiotics Rash     Review of Systems  Constitutional: Negative.   Eyes: Negative for blurred vision.  Respiratory: Negative.  Negative for shortness of breath.   Cardiovascular: Negative.  Negative for chest pain and  palpitations.  Gastrointestinal: Negative.   Psychiatric/Behavioral: Negative.   All other systems reviewed and are negative.    Today's Vitals   05/26/20 1158  BP: 126/78  Pulse: 72  Temp: 98 F (36.7 C)  TempSrc: Oral  Weight: 184 lb (83.5 kg)  Height: 5' 3.8" (1.621 m)   Body mass index is 31.78 kg/m.   Objective:  Physical Exam Vitals and nursing note reviewed.  Constitutional:      Appearance: Normal appearance. She is obese.  HENT:     Head: Normocephalic and atraumatic.     Nose:     Comments: Masked     Mouth/Throat:     Comments: Masked  Cardiovascular:     Rate and Rhythm: Normal rate and regular rhythm.     Heart sounds: Normal heart sounds.  Pulmonary:     Breath sounds: Normal breath sounds.  Musculoskeletal:     Cervical back: Normal range of motion.  Skin:    General: Skin is warm.  Neurological:     General: No focal deficit present.     Mental Status: She is alert and oriented to person, place, and time.         Assessment And Plan:     1. Type 2 diabetes mellitus with stage 2 chronic kidney disease, without long-term current use of insulin (HCC) Comments: Chronic, I will check labs as listed below. I will also check renal function. Encouraged to limit her intake of sugary beverages, refined carbs. She will f/u 4 months.  - Hemoglobin A1c - CMP14+EGFR  2. Hypertensive nephropathy Comments: Chronic, well controlled. Advised to follow a low sodium diet.  - CMP14+EGFR  3. Primary hypothyroidism Comments: I will check thyroid panel and adjust meds as needed.  - TSH - T4, free  4. Class 1 obesity due to excess calories with serious comorbidity and body mass index (BMI) of 31.0 to 31.9 in adult  She is encouraged to strive for BMI less than 30 to decrease cardiac risk. Advised to aim for at least 150 minutes of exercise per week.  Patient was given opportunity to ask questions. Patient verbalized understanding of the plan and was able to  repeat key elements of the plan. All questions were answered to their satisfaction.   I, [name], MD, have reviewed all documentation for this visit. The documentation on 06/25/20 for the exam, diagnosis, procedures, and orders are all accurate and complete.  THE PATIENT IS ENCOURAGED TO PRACTICE SOCIAL DISTANCING DUE TO THE COVID-19 PANDEMIC.

## 2020-05-27 LAB — CMP14+EGFR
ALT: 17 IU/L (ref 0–32)
AST: 20 IU/L (ref 0–40)
Albumin/Globulin Ratio: 2 (ref 1.2–2.2)
Albumin: 4.7 g/dL (ref 3.8–4.8)
Alkaline Phosphatase: 79 IU/L (ref 44–121)
BUN/Creatinine Ratio: 13 (ref 12–28)
BUN: 15 mg/dL (ref 8–27)
Bilirubin Total: 0.3 mg/dL (ref 0.0–1.2)
CO2: 27 mmol/L (ref 20–29)
Calcium: 9.8 mg/dL (ref 8.7–10.3)
Chloride: 100 mmol/L (ref 96–106)
Creatinine, Ser: 1.14 mg/dL — ABNORMAL HIGH (ref 0.57–1.00)
GFR calc Af Amer: 57 mL/min/{1.73_m2} — ABNORMAL LOW (ref >59)
GFR calc non Af Amer: 50 mL/min/{1.73_m2} — ABNORMAL LOW (ref >59)
Globulin, Total: 2.4 g/dL (ref 1.5–4.5)
Glucose: 85 mg/dL (ref 65–99)
Potassium: 4.6 mmol/L (ref 3.5–5.2)
Sodium: 140 mmol/L (ref 134–144)
Total Protein: 7.1 g/dL (ref 6.0–8.5)

## 2020-05-27 LAB — HEMOGLOBIN A1C
Est. average glucose Bld gHb Est-mCnc: 126 mg/dL
Hgb A1c MFr Bld: 6 % — ABNORMAL HIGH (ref 4.8–5.6)

## 2020-05-27 LAB — T4, FREE: Free T4: 1.7 ng/dL (ref 0.82–1.77)

## 2020-05-27 LAB — TSH: TSH: 2.54 u[IU]/mL (ref 0.450–4.500)

## 2020-05-28 ENCOUNTER — Encounter: Payer: Self-pay | Admitting: Internal Medicine

## 2020-06-04 ENCOUNTER — Other Ambulatory Visit: Payer: Self-pay | Admitting: Internal Medicine

## 2020-06-16 LAB — HM MAMMOGRAPHY

## 2020-06-21 ENCOUNTER — Encounter: Payer: Self-pay | Admitting: Internal Medicine

## 2020-06-30 ENCOUNTER — Other Ambulatory Visit: Payer: Self-pay | Admitting: Internal Medicine

## 2020-07-26 ENCOUNTER — Encounter: Payer: Self-pay | Admitting: Internal Medicine

## 2020-08-11 LAB — HM DIABETES EYE EXAM

## 2020-08-15 ENCOUNTER — Other Ambulatory Visit: Payer: Self-pay

## 2020-08-15 ENCOUNTER — Ambulatory Visit: Payer: Medicare Other | Attending: Internal Medicine

## 2020-08-15 DIAGNOSIS — Z23 Encounter for immunization: Secondary | ICD-10-CM

## 2020-08-15 NOTE — Progress Notes (Signed)
   Covid-19 Vaccination Clinic  Name:  Kelli Bond    MRN: [id number] DOB: 1951-01-22  08/15/2020  Kelli Bond was observed post Covid-19 immunization for 15 minutes without incident. She was provided with Vaccine Information Sheet and instruction to access the V-Safe system.   Kelli Bond was instructed to call 911 with any severe reactions post vaccine: Mar[place] Kitchen Difficulty breathing  . Swelling of face and throat  . A fast heartbeat  . A bad rash all over body  . Dizziness and weakness   Immunizations Administered    Name Date Dose VIS Date Route   PFIZER Comrnaty(Gray TOP) Covid-19 Vaccine 08/15/2020  1:08 PM 0.3 mL 04/14/2020 Intramuscular   Manufacturer: [employer], [employer]   Lot: [id number]   NDC: [phone]

## 2020-09-01 ENCOUNTER — Other Ambulatory Visit: Payer: Self-pay | Admitting: Internal Medicine

## 2020-09-02 ENCOUNTER — Other Ambulatory Visit (HOSPITAL_BASED_OUTPATIENT_CLINIC_OR_DEPARTMENT_OTHER): Payer: Self-pay

## 2020-09-02 MED ORDER — PFIZER-BIONT COVID-19 VAC-TRIS 30 MCG/0.3ML IM SUSP
INTRAMUSCULAR | 0 refills | Status: DC
  Filled 2020-09-02: qty 0.3, 1d supply, fill #0

## 2020-09-03 ENCOUNTER — Other Ambulatory Visit: Payer: Self-pay | Admitting: Internal Medicine

## 2020-10-04 ENCOUNTER — Encounter: Payer: Self-pay | Admitting: Internal Medicine

## 2020-10-04 ENCOUNTER — Ambulatory Visit (INDEPENDENT_AMBULATORY_CARE_PROVIDER_SITE_OTHER): Payer: Medicare Other | Admitting: Internal Medicine

## 2020-10-04 ENCOUNTER — Other Ambulatory Visit: Payer: Self-pay

## 2020-10-04 VITALS — BP 132/80 | HR 47 | Temp 98.2°F | Ht 63.8 in | Wt 184.4 lb

## 2020-10-04 DIAGNOSIS — I129 Hypertensive chronic kidney disease with stage 1 through stage 4 chronic kidney disease, or unspecified chronic kidney disease: Secondary | ICD-10-CM

## 2020-10-04 DIAGNOSIS — N182 Chronic kidney disease, stage 2 (mild): Secondary | ICD-10-CM

## 2020-10-04 DIAGNOSIS — E1122 Type 2 diabetes mellitus with diabetic chronic kidney disease: Secondary | ICD-10-CM | POA: Diagnosis not present

## 2020-10-04 DIAGNOSIS — Z23 Encounter for immunization: Secondary | ICD-10-CM

## 2020-10-04 DIAGNOSIS — E039 Hypothyroidism, unspecified: Secondary | ICD-10-CM | POA: Diagnosis not present

## 2020-10-04 DIAGNOSIS — E6609 Other obesity due to excess calories: Secondary | ICD-10-CM

## 2020-10-04 DIAGNOSIS — Z6831 Body mass index (BMI) 31.0-31.9, adult: Secondary | ICD-10-CM

## 2020-10-04 MED ORDER — SHINGRIX 50 MCG/0.5ML IM SUSR: 0.5000 mL | Freq: Once | INTRAMUSCULAR | 0 refills | Status: AC

## 2020-10-04 NOTE — Progress Notes (Signed)
I,Katawbba Wiggins,acting as a Education administrator for [name], MD.,have documented all relevant documentation on the behalf of [name], MD,as directed by  [name], MD while in the presence of [name], MD.  This visit occurred during the SARS-CoV-2 public health emergency.  Safety protocols were in place, including screening questions prior to the visit, additional usage of staff PPE, and extensive cleaning of exam room while observing appropriate contact time as indicated for disinfecting solutions.  Subjective:     Patient ID: Kelli Bond , female    DOB: 02/05/1951 , 70 y.o.   MRN: [id number]   Chief Complaint  Patient presents with   Diabetes   Hypertension   Hypothyroidism    HPI  She is here today for diabetes, blood pressure, and thyroid f/u.  She reports compliance with  meds. She denies having any issues since her last visit.   Diabetes She presents for her follow-up diabetic visit. She has type 2 diabetes mellitus. Her disease course has been stable. There are no hypoglycemic associated symptoms. Pertinent negatives for diabetes include no blurred vision and no chest pain. There are no hypoglycemic complications. Risk factors for coronary artery disease include diabetes mellitus, dyslipidemia, hypertension, obesity, sedentary lifestyle and post-menopausal. She participates in exercise intermittently. Eye exam is current.  Hypertension This is a chronic problem. The current episode started more than 1 year ago. The problem has been gradually improving since onset. The problem is controlled. Pertinent negatives include no blurred vision, chest pain, palpitations or shortness of breath. Risk factors for coronary artery disease include diabetes mellitus, dyslipidemia, post-menopausal state and sedentary lifestyle.    Past Medical History:  Diagnosis Date   Diabetes mellitus without complication ([place])    Essential hypertension, benign 12/28/2013   Hypertension     Pure hypercholesterolemia 12/28/2013   Thyroid disease      Family History  Problem Relation Age of Onset   Hypertension Father    Kidney disease Father    Bradycardia Mother        pacemaker   Breast cancer Sister    Kidney disease Brother      Current Outpatient Medications:    aspirin EC 81 MG tablet, Take 81 mg by mouth daily., Disp: , Rfl:    cetirizine (ZYRTEC) 10 MG tablet, Take 10 mg by mouth daily as needed for allergies. prn, Disp: , Rfl:    cholecalciferol (VITAMIN D) 1000 UNITS tablet, Take 1,000 Units by mouth daily. , Disp: , Rfl:    docusate sodium (COLACE) 100 MG capsule, Take by mouth., Disp: , Rfl:    fluticasone (FLONASE) 50 MCG/ACT nasal spray, USE 2 SPRAYS IN EACH NOSTRIL EVERY DAY, Disp: 16 g, Rfl: 2   JANUVIA 100 MG tablet, TAKE 1 TABLET BY MOUTH EVERY DAY, Disp: 30 tablet, Rfl: 1   Lancets (ONETOUCH DELICA PLUS [id number]) MISC, USE TO check blood sugar ONCE DAILY AS DIRECTED **100 DAY supply**, Disp: 100 each, Rfl: 1   latanoprost (XALATAN) 0.005 % ophthalmic solution, Place 1 drop into both eyes at bedtime., Disp: , Rfl:    ONETOUCH ULTRA test strip, USE TO check blood sugar ONCE DAILY **100 DAY supply**, Disp: 150 strip, Rfl: 1   rosuvastatin (CRESTOR) 20 MG tablet, TAKE 1 TABLET BY MOUTH EVERY DAY, Disp: 90 tablet, Rfl: 1   SYNTHROID 112 MCG tablet, TAKE 1 TABLET BY MOUTH monday-saturday AND TAKE 1/2 TABLET BY MOUTH ON sunday, Disp: 30 tablet, Rfl: 1   telmisartan-hydrochlorothiazide (  MICARDIS HCT) 40-12.5 MG tablet, TAKE 1 TABLET BY MOUTH EVERY DAY, Disp: 90 tablet, Rfl: 1   Allergies  Allergen Reactions   Banana Other (See Comments)    Tingling in mouth.   Sulfa Antibiotics Rash     Review of Systems  Constitutional: Negative.   Eyes:  Negative for blurred vision.  Respiratory: Negative.  Negative for shortness of breath.   Cardiovascular: Negative.  Negative for chest pain and palpitations.  Gastrointestinal: Negative.    Psychiatric/Behavioral: Negative.    All other systems reviewed and are negative.   Today's Vitals   10/04/20 1020  BP: 132/80  Pulse: (!) 47  Temp: 98.2 F (36.8 C)  TempSrc: Oral  Weight: 184 lb 6.4 oz (83.6 kg)  Height: 5' 3.8" (1.621 m)  PainSc: 0-No pain   Body mass index is 31.85 kg/m.  Wt Readings from Last 3 Encounters:  10/04/20 184 lb 6.4 oz (83.6 kg)  05/26/20 184 lb (83.5 kg)  05/26/20 184 lb (83.5 kg)   BP Readings from Last 3 Encounters:  10/04/20 132/80  05/26/20 126/78  05/26/20 126/78   Objective:  Physical Exam Vitals and nursing note reviewed.  Constitutional:      Appearance: Normal appearance.  HENT:     Head: Normocephalic and atraumatic.     Nose:     Comments: Masked     Mouth/Throat:     Comments: Masked  Cardiovascular:     Rate and Rhythm: Normal rate and regular rhythm.     Heart sounds: Normal heart sounds.  Pulmonary:     Effort: Pulmonary effort is normal.     Breath sounds: Normal breath sounds.  Skin:    General: Skin is warm.  Neurological:     General: No focal deficit present.     Mental Status: She is alert.  Psychiatric:        Mood and Affect: Mood normal.        Behavior: Behavior normal.        Assessment And Plan:     1. Type 2 diabetes mellitus with stage 2 chronic kidney disease, without long-term current use of insulin (HCC) Comments: Chronic, I will check labs as listed. She is encouraged to stay well hydrated as we enter summer months.  - BMP8+EGFR - Hemoglobin A1c  2. Hypertensive nephropathy Comments: Chronic, controlled. She is aware that goal BP is less than 130/80.  She is encouraged to follow low sodium diet.  3. Primary hypothyroidism Comments: TSH 2.54 in Jan 2022. I will recheck at her next visit. Reminded that meds should be taken upon awakening, wait 30 minutes prior to eating/taking other meds.   4. Class 1 obesity due to excess calories with serious comorbidity and body mass index (BMI) of  31.0 to 31.9 in adult Comments:  She is encouraged to strive for BMI less than 30 to decrease cardiac risk. Advised to aim for at least 150 minutes of exercise per week.   5. Immunization due Comments: I will send rx Shingrix to her local pharmacy.    Patient was given opportunity to ask questions. Patient verbalized understanding of the plan and was able to repeat key elements of the plan. All questions were answered to their satisfaction.   I, [name], MD, have reviewed all documentation for this visit. The documentation on 10/16/20 for the exam, diagnosis, procedures, and orders are all accurate and complete.  IF YOU HAVE BEEN REFERRED TO A SPECIALIST, IT MAY TAKE 1-2  WEEKS TO SCHEDULE/PROCESS THE REFERRAL. IF YOU HAVE NOT HEARD FROM US/SPECIALIST IN TWO WEEKS, PLEASE GIVE [place] A CALL AT [phone].   THE PATIENT IS ENCOURAGED TO PRACTICE SOCIAL DISTANCING DUE TO THE COVID-19 PANDEMIC.

## 2020-10-04 NOTE — Patient Instructions (Signed)

## 2020-10-05 LAB — BMP8+EGFR
BUN/Creatinine Ratio: 12 (ref 12–28)
BUN: 13 mg/dL (ref 8–27)
CO2: 24 mmol/L (ref 20–29)
Calcium: 10.2 mg/dL (ref 8.7–10.3)
Chloride: 101 mmol/L (ref 96–106)
Creatinine, Ser: 1.11 mg/dL — ABNORMAL HIGH (ref 0.57–1.00)
Glucose: 93 mg/dL (ref 65–99)
Potassium: 4.7 mmol/L (ref 3.5–5.2)
Sodium: 140 mmol/L (ref 134–144)
eGFR: 54 mL/min/{1.73_m2} — ABNORMAL LOW (ref >59)

## 2020-10-05 LAB — HEMOGLOBIN A1C
Est. average glucose Bld gHb Est-mCnc: 128 mg/dL
Hgb A1c MFr Bld: 6.1 % — ABNORMAL HIGH (ref 4.8–5.6)

## 2020-10-06 ENCOUNTER — Ambulatory Visit: Payer: Medicare Other | Admitting: Internal Medicine

## 2020-10-06 ENCOUNTER — Ambulatory Visit: Payer: Medicare Other

## 2020-10-07 ENCOUNTER — Encounter: Payer: Self-pay | Admitting: Internal Medicine

## 2020-10-16 DIAGNOSIS — E66811 Obesity, class 1: Secondary | ICD-10-CM | POA: Insufficient documentation

## 2020-10-16 DIAGNOSIS — I129 Hypertensive chronic kidney disease with stage 1 through stage 4 chronic kidney disease, or unspecified chronic kidney disease: Secondary | ICD-10-CM | POA: Insufficient documentation

## 2020-10-16 DIAGNOSIS — Z6832 Body mass index (BMI) 32.0-32.9, adult: Secondary | ICD-10-CM | POA: Insufficient documentation

## 2020-10-16 DIAGNOSIS — Z6831 Body mass index (BMI) 31.0-31.9, adult: Secondary | ICD-10-CM | POA: Insufficient documentation

## 2020-10-18 ENCOUNTER — Other Ambulatory Visit: Payer: Self-pay | Admitting: Internal Medicine

## 2020-11-09 ENCOUNTER — Other Ambulatory Visit: Payer: Self-pay | Admitting: Internal Medicine

## 2020-11-16 ENCOUNTER — Telehealth: Payer: Self-pay | Admitting: Internal Medicine

## 2020-11-16 NOTE — Telephone Encounter (Signed)
This was sent to this CM in error.

## 2020-11-16 NOTE — Telephone Encounter (Signed)
Copied from CRM [id number]. Topic: General - Other >> Nov 11, 2020 11:23 AM Pawlus, Maxine Glenn A wrote: Reason for CRM: Requested a call back from [place] at [facility] [place], please advise.

## 2021-01-04 ENCOUNTER — Other Ambulatory Visit: Payer: Self-pay | Admitting: Internal Medicine

## 2021-01-11 ENCOUNTER — Other Ambulatory Visit: Payer: Self-pay | Admitting: Internal Medicine

## 2021-01-30 ENCOUNTER — Ambulatory Visit (INDEPENDENT_AMBULATORY_CARE_PROVIDER_SITE_OTHER): Payer: Medicare Other | Admitting: Internal Medicine

## 2021-01-30 ENCOUNTER — Other Ambulatory Visit: Payer: Self-pay

## 2021-01-30 ENCOUNTER — Encounter: Payer: Self-pay | Admitting: Internal Medicine

## 2021-01-30 VITALS — BP 116/80 | Temp 98.1°F | Ht 64.0 in | Wt 180.6 lb

## 2021-01-30 DIAGNOSIS — Z23 Encounter for immunization: Secondary | ICD-10-CM

## 2021-01-30 DIAGNOSIS — E039 Hypothyroidism, unspecified: Secondary | ICD-10-CM | POA: Diagnosis not present

## 2021-01-30 DIAGNOSIS — N182 Chronic kidney disease, stage 2 (mild): Secondary | ICD-10-CM | POA: Diagnosis not present

## 2021-01-30 DIAGNOSIS — I129 Hypertensive chronic kidney disease with stage 1 through stage 4 chronic kidney disease, or unspecified chronic kidney disease: Secondary | ICD-10-CM

## 2021-01-30 DIAGNOSIS — E1122 Type 2 diabetes mellitus with diabetic chronic kidney disease: Secondary | ICD-10-CM | POA: Diagnosis not present

## 2021-01-30 DIAGNOSIS — Z0000 Encounter for general adult medical examination without abnormal findings: Secondary | ICD-10-CM | POA: Diagnosis not present

## 2021-01-30 DIAGNOSIS — Z6831 Body mass index (BMI) 31.0-31.9, adult: Secondary | ICD-10-CM

## 2021-01-30 DIAGNOSIS — E6609 Other obesity due to excess calories: Secondary | ICD-10-CM

## 2021-01-30 LAB — POCT UA - MICROALBUMIN
Albumin/Creatinine Ratio, Urine, POC: 30
Creatinine, POC: 200 mg/dL
Microalbumin Ur, POC: 10 mg/L

## 2021-01-30 LAB — POCT URINALYSIS DIPSTICK
Bilirubin, UA: NEGATIVE
Glucose, UA: NEGATIVE
Ketones, UA: NEGATIVE
Leukocytes, UA: NEGATIVE
Nitrite, UA: NEGATIVE
Protein, UA: NEGATIVE
Spec Grav, UA: 1.015 (ref 1.010–1.025)
Urobilinogen, UA: 0.2 E.U./dL
pH, UA: 5 (ref 5.0–8.0)

## 2021-01-30 NOTE — Patient Instructions (Signed)
Health Maintenance, Female Adopting a healthy lifestyle and getting preventive care are important in promoting health and wellness. Ask your health care provider about: The right schedule for you to have regular tests and exams. Things you can do on your own to prevent diseases and keep yourself healthy. What should I know about diet, weight, and exercise? Eat a healthy diet  Eat a diet that includes plenty of vegetables, fruits, low-fat dairy products, and lean protein. Do not eat a lot of foods that are high in solid fats, added sugars, or sodium. Maintain a healthy weight Body mass index (BMI) is used to identify weight problems. It estimates body fat based on height and weight. Your health care provider can help determine your BMI and help you achieve or maintain a healthy weight. Get regular exercise Get regular exercise. This is one of the most important things you can do for your health. Most adults should: Exercise for at least 150 minutes each week. The exercise should increase your heart rate and make you sweat (moderate-intensity exercise). Do strengthening exercises at least twice a week. This is in addition to the moderate-intensity exercise. Spend less time sitting. Even light physical activity can be beneficial. Watch cholesterol and blood lipids Have your blood tested for lipids and cholesterol at 69 years of age, then have this test every 5 years. Have your cholesterol levels checked more often if: Your lipid or cholesterol levels are high. You are older than 69 years of age. You are at high risk for heart disease. What should I know about cancer screening? Depending on your health history and family history, you may need to have cancer screening at various ages. This may include screening for: Breast cancer. Cervical cancer. Colorectal cancer. Skin cancer. Lung cancer. What should I know about heart disease, diabetes, and high blood pressure? Blood pressure and heart  disease High blood pressure causes heart disease and increases the risk of stroke. This is more likely to develop in people who have high blood pressure readings, are of African descent, or are overweight. Have your blood pressure checked: Every 3-5 years if you are 18-39 years of age. Every year if you are 40 years old or older. Diabetes Have regular diabetes screenings. This checks your fasting blood sugar level. Have the screening done: Once every three years after age 40 if you are at a normal weight and have a low risk for diabetes. More often and at a younger age if you are overweight or have a high risk for diabetes. What should I know about preventing infection? Hepatitis B If you have a higher risk for hepatitis B, you should be screened for this virus. Talk with your health care provider to find out if you are at risk for hepatitis B infection. Hepatitis C Testing is recommended for: Everyone born from 1945 through 1965. Anyone with known risk factors for hepatitis C. Sexually transmitted infections (STIs) Get screened for STIs, including gonorrhea and chlamydia, if: You are sexually active and are younger than 69 years of age. You are older than 69 years of age and your health care provider tells you that you are at risk for this type of infection. Your sexual activity has changed since you were last screened, and you are at increased risk for chlamydia or gonorrhea. Ask your health care provider if you are at risk. Ask your health care provider about whether you are at high risk for HIV. Your health care provider may recommend a prescription medicine   to help prevent HIV infection. If you choose to take medicine to prevent HIV, you should first get tested for HIV. You should then be tested every 3 months for as long as you are taking the medicine. Pregnancy If you are about to stop having your period (premenopausal) and you may become pregnant, seek counseling before you get  pregnant. Take 400 to 800 micrograms (mcg) of folic acid every day if you become pregnant. Ask for birth control (contraception) if you want to prevent pregnancy. Osteoporosis and menopause Osteoporosis is a disease in which the bones lose minerals and strength with aging. This can result in bone fractures. If you are 65 years old or older, or if you are at risk for osteoporosis and fractures, ask your health care provider if you should: Be screened for bone loss. Take a calcium or vitamin D supplement to lower your risk of fractures. Be given hormone replacement therapy (HRT) to treat symptoms of menopause. Follow these instructions at home: Lifestyle Do not use any products that contain nicotine or tobacco, such as cigarettes, e-cigarettes, and chewing tobacco. If you need help quitting, ask your health care provider. Do not use street drugs. Do not share needles. Ask your health care provider for help if you need support or information about quitting drugs. Alcohol use Do not drink alcohol if: Your health care provider tells you not to drink. You are pregnant, may be pregnant, or are planning to become pregnant. If you drink alcohol: Limit how much you use to 0-1 drink a day. Limit intake if you are breastfeeding. Be aware of how much alcohol is in your drink. In the [place] one drink equals one 12 oz bottle of beer (355 mL), one 5 oz glass of wine (148 mL), or one 1 oz glass of hard liquor (44 mL). General instructions Schedule regular health, dental, and eye exams. Stay current with your vaccines. Tell your health care provider if: You often feel depressed. You have ever been abused or do not feel safe at home. Summary Adopting a healthy lifestyle and getting preventive care are important in promoting health and wellness. Follow your health care provider's instructions about healthy diet, exercising, and getting tested or screened for diseases. Follow your health care provider's  instructions on monitoring your cholesterol and blood pressure. This information is not intended to replace advice given to you by your health care provider. Make sure you discuss any questions you have with your health care provider. Document Revised: 07/01/2020 Document Reviewed: 04/16/2018 Elsevier Patient Education  2022 [employer]  

## 2021-01-30 NOTE — Progress Notes (Signed)
Kelli Bond,acting as a Education administrator for Kelli Greenland, MD.,have documented all relevant documentation on the behalf of Kelli Greenland, MD,as directed by  Kelli Greenland, MD while in the presence of Kelli Greenland, MD.  This visit occurred during the SARS-CoV-2 public health emergency.  Safety protocols were in place, including screening questions prior to the visit, additional usage of staff PPE, and extensive cleaning of exam room while observing appropriate contact time as indicated for disinfecting solutions.  Subjective:     Patient ID: Kelli Bond , female    DOB: September 04, 1950 , 70 y.o.   MRN: [id number]   Chief Complaint  Patient presents with   Annual Exam   Diabetes   Hypertension    HPI  Patient presents today for her hm. She no longer sees a GYN provider. She reports compliance with meds. Denies headaches, chest pain and shortness of breath. Her hands are full w/ caring for her husband who is being treated for pancreatic cancer.   Diabetes She presents for her follow-up diabetic visit. She has type 2 diabetes mellitus. Her disease course has been stable. There are no hypoglycemic associated symptoms. Pertinent negatives for diabetes include no blurred vision and no chest pain. There are no hypoglycemic complications. Risk factors for coronary artery disease include diabetes mellitus, dyslipidemia, hypertension, obesity, sedentary lifestyle and post-menopausal. She is following a diabetic diet. She never participates in exercise. An ACE inhibitor/angiotensin II receptor blocker is being taken. Eye exam is not current.  Hypertension This is a chronic problem. The current episode started more than 1 year ago. The problem has been gradually improving since onset. The problem is controlled. Pertinent negatives include no blurred vision, chest pain, palpitations or shortness of breath. Risk factors for coronary artery disease include diabetes mellitus, dyslipidemia,  post-menopausal state and sedentary lifestyle. The current treatment provides moderate improvement.    Past Medical History:  Diagnosis Date   Diabetes mellitus without complication ([place])    Essential hypertension, benign 12/28/2013   Hypertension    Pure hypercholesterolemia 12/28/2013   Thyroid disease      Family History  Problem Relation Age of Onset   Hypertension Father    Kidney disease Father    Bradycardia Mother        pacemaker   Breast cancer Sister    Kidney disease Brother      Current Outpatient Medications:    aspirin EC 81 MG tablet, Take 81 mg by mouth daily., Disp: , Rfl:    cetirizine (ZYRTEC) 10 MG tablet, Take 10 mg by mouth daily as needed for allergies. prn, Disp: , Rfl:    cholecalciferol (VITAMIN D) 1000 UNITS tablet, Take 1,000 Units by mouth daily. , Disp: , Rfl:    docusate sodium (COLACE) 100 MG capsule, Take by mouth., Disp: , Rfl:    fluticasone (FLONASE) 50 MCG/ACT nasal spray, USE 2 SPRAYS IN EACH NOSTRIL EVERY DAY, Disp: 16 g, Rfl: 2   JANUVIA 100 MG tablet, TAKE 1 TABLET BY MOUTH EVERY DAY, Disp: 30 tablet, Rfl: 1   Lancets (ONETOUCH DELICA PLUS [id number]) MISC, USE TO check blood sugar ONCE DAILY AS DIRECTED **100 DAY supply**, Disp: 100 each, Rfl: 1   latanoprost (XALATAN) 0.005 % ophthalmic solution, Place 1 drop into both eyes at bedtime., Disp: , Rfl:    ONETOUCH ULTRA test strip, USE TO CHECK BLOOD SUGAR 1 time DAILY., Disp: 150 strip, Rfl: 1   rosuvastatin (CRESTOR) 20 MG tablet, TAKE 1 TABLET  BY MOUTH EVERY DAY, Disp: 90 tablet, Rfl: 1   SYNTHROID 112 MCG tablet, TAKE 1 TABLET BY MOUTH monday-saturday AND TAKE 1/2 TABLET BY MOUTH ON sunday, Disp: 30 tablet, Rfl: 1   telmisartan-hydrochlorothiazide (MICARDIS HCT) 40-12.5 MG tablet, TAKE 1 TABLET BY MOUTH EVERY DAY, Disp: 90 tablet, Rfl: 1   Allergies  Allergen Reactions   Banana Other (See Comments)    Tingling in mouth.   Sulfa Antibiotics Rash      The patient states she uses post  menopausal status for birth control. Last LMP was No LMP recorded. Patient has had a hysterectomy.. Negative for Dysmenorrhea. Negative for: breast discharge, breast lump(s), breast pain and breast self exam. Associated symptoms include abnormal vaginal bleeding. Pertinent negatives include abnormal bleeding (hematology), anxiety, decreased libido, depression, difficulty falling sleep, dyspareunia, history of infertility, nocturia, sexual dysfunction, sleep disturbances, urinary incontinence, urinary urgency, vaginal discharge and vaginal itching. Diet regular.The patient states her exercise level is  intermittent, but states she is "quite busy" and has active daily activities.   . The patient's tobacco use is:  Social History   Tobacco Use  Smoking Status Never  Smokeless Tobacco Never  . She has been exposed to passive smoke. The patient's alcohol use is:  Social History   Substance and Sexual Activity  Alcohol Use Not Currently   Review of Systems  Constitutional: Negative.   HENT: Negative.    Eyes: Negative.  Negative for blurred vision.  Respiratory: Negative.  Negative for shortness of breath.   Cardiovascular: Negative.  Negative for chest pain and palpitations.  Gastrointestinal: Negative.   Endocrine: Negative.   Genitourinary: Negative.   Musculoskeletal: Negative.   Skin: Negative.   Allergic/Immunologic: Negative.   Neurological: Negative.   Hematological: Negative.   Psychiatric/Behavioral: Negative.      Today's Vitals   01/30/21 1140  BP: 116/80  Temp: 98.1 F (36.7 C)  Weight: 180 lb 9.6 oz (81.9 kg)  Height: _0  (1.626 m)  PainSc: 0-No pain   Body mass index is 31 kg/m.  Wt Readings from Last 3 Encounters:  01/30/21 180 lb 9.6 oz (81.9 kg)  10/04/20 184 lb 6.4 oz (83.6 kg)  05/26/20 184 lb (83.5 kg)     Objective:  Physical Exam Vitals and nursing note reviewed.  Constitutional:      Appearance: Normal appearance.  HENT:     Head: Normocephalic  and atraumatic.     Right Ear: Tympanic membrane, ear canal and external ear normal.     Left Ear: Tympanic membrane, ear canal and external ear normal.     Nose:     Comments: Masked     Mouth/Throat:     Comments: Masked  Eyes:     Extraocular Movements: Extraocular movements intact.     Conjunctiva/sclera: Conjunctivae normal.     Pupils: Pupils are equal, round, and reactive to light.  Cardiovascular:     Rate and Rhythm: Normal rate and regular rhythm.     Pulses: Normal pulses.          Dorsalis pedis pulses are 2+ on the right side and 2+ on the left side.     Heart sounds: Normal heart sounds.  Pulmonary:     Effort: Pulmonary effort is normal.     Breath sounds: Normal breath sounds.  Chest:  Breasts:    Tanner Score is 5.     Right: Normal.     Left: Normal.  Abdominal:     General:  Abdomen is flat. Bowel sounds are normal.     Palpations: Abdomen is soft.  Genitourinary:    Comments: deferred Musculoskeletal:        General: Normal range of motion.     Cervical back: Normal range of motion and neck supple.  Feet:     Right foot:     Protective Sensation: 5 sites tested.  5 sites sensed.     Skin integrity: Skin integrity normal.     Toenail Condition: Right toenails are normal.     Left foot:     Protective Sensation: 5 sites tested.  5 sites sensed.     Skin integrity: Skin integrity normal.     Toenail Condition: Left toenails are normal.  Skin:    General: Skin is warm and dry.  Neurological:     General: No focal deficit present.     Mental Status: She is alert and oriented to person, place, and time.  Psychiatric:        Mood and Affect: Mood normal.        Behavior: Behavior normal.        Assessment And Plan:     1. Encounter for general adult medical examination w/o abnormal findings Comments: A full exam was performed. Importance of monthly self breast exams was discussed with the patient. PATIENT IS ADVISED TO GET 30-45 MINUTES REGULAR  EXERCISE NO LESS THAN FOUR TO FIVE DAYS PER WEEK - BOTH WEIGHTBEARING EXERCISES AND AEROBIC ARE RECOMMENDED.  PATIENT IS ADVISED TO FOLLOW A HEALTHY DIET WITH AT LEAST SIX FRUITS/VEGGIES PER DAY, DECREASE INTAKE OF RED MEAT, AND TO INCREASE FISH INTAKE TO TWO DAYS PER WEEK.  MEATS/FISH SHOULD NOT BE FRIED, BAKED OR BROILED IS PREFERABLE.  IT IS ALSO IMPORTANT TO CUT BACK ON YOUR SUGAR INTAKE. PLEASE AVOID ANYTHING WITH ADDED SUGAR, CORN SYRUP OR OTHER SWEETENERS. IF YOU MUST USE A SWEETENER, YOU CAN TRY STEVIA. IT IS ALSO IMPORTANT TO AVOID ARTIFICIALLY SWEETENERS AND DIET BEVERAGES. LASTLY, I SUGGEST WEARING SPF 50 SUNSCREEN ON EXPOSED PARTS AND ESPECIALLY WHEN IN THE DIRECT SUNLIGHT FOR AN EXTENDED PERIOD OF TIME.  PLEASE AVOID FAST FOOD RESTAURANTS AND INCREASE YOUR WATER INTAKE.   2. Type 2 diabetes mellitus with stage 2 chronic kidney disease, without long-term current use of insulin (HCC) Comments: Diabetic foot exam was performed. We discussed the use of Farxiga to address cardiac/CKD risk. I DISCUSSED WITH THE PATIENT AT LENGTH REGARDING THE GOALS OF GLYCEMIC CONTROL AND POSSIBLE LONG-TERM COMPLICATIONS.  I  ALSO STRESSED THE IMPORTANCE OF COMPLIANCE WITH HOME GLUCOSE MONITORING, DIETARY RESTRICTIONS INCLUDING AVOIDANCE OF SUGARY DRINKS/PROCESSED FOODS,  ALONG WITH REGULAR EXERCISE.  I  ALSO STRESSED THE IMPORTANCE OF ANNUAL EYE EXAMS, SELF FOOT CARE AND COMPLIANCE WITH OFFICE VISITS.  - POCT Urinalysis Dipstick (81002) - POCT UA - Microalbumin - CBC - CMP14+EGFR - Lipid panel - Hemoglobin A1c  3. Hypertensive nephropathy Comments: Chronic, well controlled. EKG performed, SB w/o acute changes. She is encouraged to follow low sodium diet. She will rto in 4-6 months for re-evaluation.  - EKG 12-Lead  4. Primary hypothyroidism Comments: I will check thyroid panel and adjust meds as needed.  She will rto in six months for re-evaluation.  - TSH + free T4  5. Class 1 obesity due to excess  calories with serious comorbidity and body mass index (BMI) of 31.0 to 31.9 in adult Comments: She is encouraged to strive for BMI less than 29 to decrease cardiac risk.  She  is encouraged to aim for at least 150 min of dedicated exercise per week.   6. Immunization due Comments: I will send rx Shingrix to her local pharmacy. She is going to [facility] today to get her flu shot.    Patient was given opportunity to ask questions. Patient verbalized understanding of the plan and was able to repeat key elements of the plan. All questions were answered to their satisfaction.   I, Kelli Greenland, MD, have reviewed all documentation for this visit. The documentation on 01/30/21 for the exam, diagnosis, procedures, and orders are all accurate and complete.  THE PATIENT IS ENCOURAGED TO PRACTICE SOCIAL DISTANCING DUE TO THE COVID-19 PANDEMIC.

## 2021-01-31 LAB — CMP14+EGFR
ALT: 14 IU/L (ref 0–32)
AST: 22 IU/L (ref 0–40)
Albumin/Globulin Ratio: 2.2 (ref 1.2–2.2)
Albumin: 4.8 g/dL (ref 3.8–4.8)
Alkaline Phosphatase: 80 IU/L (ref 44–121)
BUN/Creatinine Ratio: 10 — ABNORMAL LOW (ref 12–28)
BUN: 12 mg/dL (ref 8–27)
Bilirubin Total: 0.2 mg/dL (ref 0.0–1.2)
CO2: 26 mmol/L (ref 20–29)
Calcium: 9.8 mg/dL (ref 8.7–10.3)
Chloride: 103 mmol/L (ref 96–106)
Creatinine, Ser: 1.26 mg/dL — ABNORMAL HIGH (ref 0.57–1.00)
Globulin, Total: 2.2 g/dL (ref 1.5–4.5)
Glucose: 79 mg/dL (ref 70–99)
Potassium: 4.9 mmol/L (ref 3.5–5.2)
Sodium: 139 mmol/L (ref 134–144)
Total Protein: 7 g/dL (ref 6.0–8.5)
eGFR: 46 mL/min/{1.73_m2} — ABNORMAL LOW (ref >59)

## 2021-01-31 LAB — CBC
Hematocrit: 43.5 % (ref 34.0–46.6)
Hemoglobin: 13.9 g/dL (ref 11.1–15.9)
MCH: 25.9 pg — ABNORMAL LOW (ref 26.6–33.0)
MCHC: 32 g/dL (ref 31.5–35.7)
MCV: 81 fL (ref 79–97)
Platelets: 254 10*3/uL (ref 150–450)
RBC: 5.36 x10E6/uL — ABNORMAL HIGH (ref 3.77–5.28)
RDW: 14.1 % (ref 11.7–15.4)
WBC: 5.5 10*3/uL (ref 3.4–10.8)

## 2021-01-31 LAB — LIPID PANEL
Chol/HDL Ratio: 2.4 ratio (ref 0.0–4.4)
Cholesterol, Total: 119 mg/dL (ref 100–199)
HDL: 49 mg/dL (ref >39)
LDL Chol Calc (NIH): 54 mg/dL (ref 0–99)
Triglycerides: 82 mg/dL (ref 0–149)
VLDL Cholesterol Cal: 16 mg/dL (ref 5–40)

## 2021-01-31 LAB — HEMOGLOBIN A1C
Est. average glucose Bld gHb Est-mCnc: 128 mg/dL
Hgb A1c MFr Bld: 6.1 % — ABNORMAL HIGH (ref 4.8–5.6)

## 2021-01-31 LAB — TSH+FREE T4
Free T4: 1.68 ng/dL (ref 0.82–1.77)
TSH: 2.03 u[IU]/mL (ref 0.450–4.500)

## 2021-02-01 ENCOUNTER — Encounter: Payer: Self-pay | Admitting: Internal Medicine

## 2021-02-06 ENCOUNTER — Encounter: Payer: Self-pay | Admitting: Internal Medicine

## 2021-02-06 ENCOUNTER — Telehealth (INDEPENDENT_AMBULATORY_CARE_PROVIDER_SITE_OTHER): Payer: Medicare Other | Admitting: Internal Medicine

## 2021-02-06 VITALS — BP 114/65 | HR 101 | Temp 98.6°F | Ht 64.0 in | Wt 174.0 lb

## 2021-02-06 DIAGNOSIS — U071 COVID-19: Secondary | ICD-10-CM | POA: Diagnosis not present

## 2021-02-06 NOTE — Progress Notes (Signed)
Virtual Visit via Video   This visit type was conducted due to national recommendations for restrictions regarding the COVID-19 Pandemic (e.g. social distancing) in an effort to limit this patient's exposure and mitigate transmission in our community.  Due to her co-morbid illnesses, this patient is at least at moderate risk for complications without adequate follow up.  This format is felt to be most appropriate for this patient at this time.  All issues noted in this document were discussed and addressed.  A limited physical exam was performed with this format.    This visit type was conducted due to national recommendations for restrictions regarding the COVID-19 Pandemic (e.g. social distancing) in an effort to limit this patient's exposure and mitigate transmission in our community.  Patients identity confirmed using two different identifiers.  This format is felt to be most appropriate for this patient at this time.  All issues noted in this document were discussed and addressed.  No physical exam was performed (except for noted visual exam findings with Video Visits).    Date:  02/06/2021   ID:  Kelli Bond, DOB [birth year], MRN [id number]  Patient Location:  In car, pulled over in parking space  Provider location:   Office    Chief Complaint:  "I have COVID"  History of Present Illness:    Kelli Bond is a 70 y.o. female who presents via video conferencing for a telehealth visit today.    The patient does have symptoms concerning for COVID-19 infection (fever, chills, cough, or new shortness of breath).   She presents today for virtual visit. She prefers this method of contact due to COVID-19 pandemic.  She states she tested positive for COVID on Sunday with a home test.  She reports she started to have symptoms on Friday - she first had scratchy throat. By Saturday evening, she developed body aches, non-productive cough and fatigue. She did not take her temperature, but  feels she may have had a low-grade fever. On Sunday, she took a home test which was positive.    Today, she feels much better. Initially, she felt she wanted treatment; however, now she doesn't think she needs COVID specific treatment.  She [place] office when she got this call.     Past Medical History:  Diagnosis Date   Diabetes mellitus without complication (HCC)    Essential hypertension, benign 12/28/2013   Hypertension    Pure hypercholesterolemia 12/28/2013   Thyroid disease    Past Surgical History:  Procedure Laterality Date   ABDOMINAL HYSTERECTOMY     ORIF ANKLE FRACTURE Right 10/10/2017   Procedure: OPEN REDUCTION INTERNAL FIXATION (ORIF) ANKLE FRACTURE;  Surgeon: Kennedy Bucker, MD;  Location: [facility] ORS;  Service: Orthopedics;  Laterality: Right;     No outpatient medications have been marked as taking for the 02/06/21 encounter (Video Visit) with Dorothyann Peng, MD.     Allergies:   Banana and Sulfa antibiotics   Social History   Tobacco Use   Smoking status: Never   Smokeless tobacco: Never  Vaping Use   Vaping Use: Never used  Substance Use Topics   Alcohol use: Not Currently   Drug use: Never     Family Hx: The patient's family history includes Bradycardia in her mother; Breast cancer in her sister; Hypertension in her father; Kidney disease in her brother and father.  ROS:   Please see the history of present illness.    Review of Systems  Constitutional:  Positive for malaise/fatigue.  HENT:  Positive for congestion.   Respiratory: Negative.    Cardiovascular: Negative.   Gastrointestinal: Negative.   Neurological: Negative.   Psychiatric/Behavioral: Negative.     All other systems reviewed and are negative.   Labs/Other Tests and Data Reviewed:    Recent Labs: 01/30/2021: ALT 14; BUN 12; Creatinine, Ser 1.26; Hemoglobin 13.9; Platelets 254; Potassium 4.9; Sodium 139; TSH 2.030   Recent Lipid Panel Lab Results  Component Value  Date/Time   CHOL 119 01/30/2021 12:43 PM   TRIG 82 01/30/2021 12:43 PM   HDL 49 01/30/2021 12:43 PM   CHOLHDL 2.4 01/30/2021 12:43 PM   LDLCALC 54 01/30/2021 12:43 PM    Wt Readings from Last 3 Encounters:  02/06/21 174 lb (78.9 kg)  01/30/21 180 lb 9.6 oz (81.9 kg)  10/04/20 184 lb 6.4 oz (83.6 kg)     Exam:    Vital Signs:  BP 114/65   Pulse (!) 101   Temp 98.6 F (37 C) (Oral)   Ht 5\' 4"  (1.626 m)   Wt 174 lb (78.9 kg)   BMI 29.87 kg/m     Physical Exam Vitals and nursing note reviewed.  Constitutional:      Appearance: Normal appearance.  HENT:     Head: Normocephalic and atraumatic.  Eyes:     Extraocular Movements: Extraocular movements intact.  Pulmonary:     Effort: Pulmonary effort is normal.  Neurological:     Mental Status: She is alert and oriented to person, place, and time.  Psychiatric:        Mood and Affect: Affect normal.    ASSESSMENT & PLAN:    1. COVID-19 She states her symptoms are improving today. She does not wish to pursue treatment with Paxlovid. She is at moderate risk for complications due to underlying DM/HTN.   She is advised to take Vitamin C, D, Zinc.  Keep yourself hydrated with a lot of water and rest. Take Delsym for cough and Mucinex as needed. Take Tylenol or pain reliever every 4-6 hours as needed for pain/fever/body ache. If you have elevated blood pressure, you can take OTC Coricidin. You can also take OTC Oscillococcinum, a homeopathic remedy,  to help with your symptoms.  Educated patient that if symptoms get worse or if he/she experiences any SOB, chest pain or pain in their legs to seek immediate emergency care. Continue to monitor your oxygen levels. Call office ASAP if you have any questions. Quarantine for 5 days if tested positive and no symptoms or 10 days if tested positive and you are with symptoms. Wear a mask around other people.   COVID-19 Education: The signs and symptoms of COVID-19 were discussed with the  patient and how to seek care for testing (follow up with PCP or arrange E-visit).  The importance of social distancing was discussed today.  Patient Risk:   After full review of this patients clinical status, I feel that they are at least moderate risk at this time.  Time:   Today, I have spent 10 minutes with the patient with telehealth technology discussing above diagnoses.     Medication Adjustments/Labs and Tests Ordered: Current medicines are reviewed at length with the patient today.  Concerns regarding medicines are outlined above.   Tests Ordered: No orders of the defined types were placed in this encounter.   Medication Changes: No orders of the defined types were placed in this encounter.   Disposition:  Follow up  prn  Signed, Gwynneth Aliment, MD

## 2021-02-06 NOTE — Patient Instructions (Signed)
10 Things You Can Do to Manage Your COVID-19 Symptoms at Home If you have possible or confirmed COVID-19 Stay home except to get medical care. Monitor your symptoms carefully. If your symptoms get worse, call your healthcare provider immediately. Get rest and stay hydrated. If you have a medical appointment, call the healthcare provider ahead of time and tell them that you have or may have COVID-19. For medical emergencies, call 911 and notify the dispatch personnel that you have or may have COVID-19. Cover your cough and sneezes with a tissue or use the inside of your elbow. Wash your hands often with soap and water for at least 20 seconds or clean your hands with an alcohol-based hand sanitizer that contains at least 60% alcohol. As much as possible, stay in a specific room and away from other people in your home. Also, you should use a separate bathroom, if available. If you need to be around other people in or outside of the home, wear a mask. Avoid sharing personal items with other people in your household, like dishes, towels, and bedding. Clean all surfaces that are touched often, like counters, tabletops, and doorknobs. Use household cleaning sprays or wipes according to the label instructions. cdc.gov/coronavirus 11/20/2019 This information is not intended to replace advice given to you by your health care provider. Make sure you discuss any questions you have with your health care provider. Document Revised: 09/08/2020 Document Reviewed: 09/08/2020 Elsevier Patient Education  2022 [employer]vier Inc.  

## 2021-02-26 ENCOUNTER — Encounter: Payer: Self-pay | Admitting: Internal Medicine

## 2021-02-27 ENCOUNTER — Other Ambulatory Visit: Payer: Self-pay | Admitting: Internal Medicine

## 2021-02-27 DIAGNOSIS — R3129 Other microscopic hematuria: Secondary | ICD-10-CM

## 2021-03-09 ENCOUNTER — Other Ambulatory Visit: Payer: Self-pay | Admitting: Internal Medicine

## 2021-03-23 ENCOUNTER — Other Ambulatory Visit (HOSPITAL_BASED_OUTPATIENT_CLINIC_OR_DEPARTMENT_OTHER): Payer: Self-pay

## 2021-03-23 ENCOUNTER — Ambulatory Visit: Payer: Medicare Other | Attending: Internal Medicine

## 2021-03-23 DIAGNOSIS — Z23 Encounter for immunization: Secondary | ICD-10-CM

## 2021-03-23 MED ORDER — PFIZER COVID-19 VAC BIVALENT 30 MCG/0.3ML IM SUSP
INTRAMUSCULAR | 0 refills | Status: DC
  Filled 2021-03-23: qty 0.3, 1d supply, fill #0

## 2021-03-23 NOTE — Progress Notes (Signed)
   Covid-19 Vaccination Clinic  Name:  Kelli Bond    MRN: [id number] DOB: 1950-09-02  03/23/2021  [name] was observed post Covid-19 immunization for 15 minutes without incident. She was provided with Vaccine Information Sheet and instruction to access the V-Safe system.   [name] was instructed to call 911 with any severe reactions post vaccine: Difficulty breathing  Swelling of face and throat  A fast heartbeat  A bad rash all over body  Dizziness and weakness   Immunizations Administered     Name Date Dose VIS Date Route   Pfizer Covid-19 Vaccine Bivalent Booster 03/23/2021  9:33 AM 0.3 mL 01/04/2021 Intramuscular   Manufacturer: [employer], [employer]   Lot: [id number]   NDC: [phone]

## 2021-04-03 ENCOUNTER — Encounter: Payer: Self-pay | Admitting: Internal Medicine

## 2021-04-05 ENCOUNTER — Other Ambulatory Visit: Payer: Self-pay | Admitting: Internal Medicine

## 2021-04-12 ENCOUNTER — Other Ambulatory Visit: Payer: Self-pay

## 2021-04-12 ENCOUNTER — Ambulatory Visit (HOSPITAL_BASED_OUTPATIENT_CLINIC_OR_DEPARTMENT_OTHER)
Admission: RE | Admit: 2021-04-12 | Discharge: 2021-04-12 | Disposition: A | Discharge status: Home / Self Care | Payer: Medicare Other | Source: Ambulatory Visit | Attending: Internal Medicine | Admitting: Internal Medicine

## 2021-04-12 DIAGNOSIS — R3129 Other microscopic hematuria: Secondary | ICD-10-CM | POA: Insufficient documentation

## 2021-04-14 ENCOUNTER — Encounter: Payer: Self-pay | Admitting: Internal Medicine

## 2021-06-01 ENCOUNTER — Other Ambulatory Visit: Payer: Self-pay

## 2021-06-01 ENCOUNTER — Ambulatory Visit (INDEPENDENT_AMBULATORY_CARE_PROVIDER_SITE_OTHER): Payer: Medicare Other | Admitting: Internal Medicine

## 2021-06-01 ENCOUNTER — Encounter: Payer: Self-pay | Admitting: Internal Medicine

## 2021-06-01 ENCOUNTER — Ambulatory Visit (INDEPENDENT_AMBULATORY_CARE_PROVIDER_SITE_OTHER): Payer: Medicare Other

## 2021-06-01 VITALS — BP 118/60 | HR 64 | Temp 98.0°F | Ht 63.8 in | Wt 179.0 lb

## 2021-06-01 DIAGNOSIS — I129 Hypertensive chronic kidney disease with stage 1 through stage 4 chronic kidney disease, or unspecified chronic kidney disease: Secondary | ICD-10-CM | POA: Diagnosis not present

## 2021-06-01 DIAGNOSIS — Z6830 Body mass index (BMI) 30.0-30.9, adult: Secondary | ICD-10-CM

## 2021-06-01 DIAGNOSIS — N182 Chronic kidney disease, stage 2 (mild): Secondary | ICD-10-CM

## 2021-06-01 DIAGNOSIS — E1122 Type 2 diabetes mellitus with diabetic chronic kidney disease: Secondary | ICD-10-CM

## 2021-06-01 DIAGNOSIS — E039 Hypothyroidism, unspecified: Secondary | ICD-10-CM | POA: Diagnosis not present

## 2021-06-01 DIAGNOSIS — Z0000 Encounter for general adult medical examination without abnormal findings: Secondary | ICD-10-CM

## 2021-06-01 DIAGNOSIS — E6609 Other obesity due to excess calories: Secondary | ICD-10-CM

## 2021-06-01 NOTE — Progress Notes (Signed)
[name],acting as a Education administrator for [name], MD.,have documented all relevant documentation on the behalf of [name], MD,as directed by  [name], MD while in the presence of [name], MD.  This visit occurred during the SARS-CoV-2 public health emergency.  Safety protocols were in place, including screening questions prior to the visit, additional usage of staff PPE, and extensive cleaning of exam room while observing appropriate contact time as indicated for disinfecting solutions.  Subjective:     Patient ID: Kelli Bond , female    DOB: [birth year] , 71 y.o.   MRN: [id number]   Chief Complaint  Patient presents with   Diabetes   Hypertension    HPI  She is here today for diabetes, blood pressure, and thyroid f/u.  She reports compliance with  meds.   She is also scheduled for AWV with [facility].   Diabetes She presents for her follow-up diabetic visit. She has type 2 diabetes mellitus. Her disease course has been stable. There are no hypoglycemic associated symptoms. Pertinent negatives for diabetes include no blurred vision, no chest pain, no polydipsia, no polyphagia and no polyuria. There are no hypoglycemic complications. Risk factors for coronary artery disease include diabetes mellitus, dyslipidemia, hypertension, obesity, sedentary lifestyle and post-menopausal. She participates in exercise intermittently. Eye exam is current.  Hypertension This is a chronic problem. The current episode started more than 1 year ago. The problem has been gradually improving since onset. The problem is controlled. Pertinent negatives include no blurred vision, chest pain, palpitations or shortness of breath. Risk factors for coronary artery disease include diabetes mellitus, dyslipidemia, post-menopausal state and sedentary lifestyle.    Past Medical History:  Diagnosis Date   Diabetes mellitus without complication ([place])    Essential hypertension, benign  12/28/2013   Hypertension    Pure hypercholesterolemia 12/28/2013   Thyroid disease      Family History  Problem Relation Age of Onset   Hypertension Father    Kidney disease Father    Bradycardia Mother        pacemaker   Breast cancer Sister    Kidney disease Brother      Current Outpatient Medications:    aspirin EC 81 MG tablet, Take 81 mg by mouth daily., Disp: , Rfl:    cetirizine (ZYRTEC) 10 MG tablet, Take 10 mg by mouth daily as needed for allergies. prn, Disp: , Rfl:    cholecalciferol (VITAMIN D) 1000 UNITS tablet, Take 1,000 Units by mouth daily. , Disp: , Rfl:    COVID-19 mRNA bivalent vaccine, Pfizer, (PFIZER COVID-19 VAC BIVALENT) injection, Inject into the muscle., Disp: 0.3 mL, Rfl: 0   docusate sodium (COLACE) 100 MG capsule, Take by mouth., Disp: , Rfl:    fluticasone (FLONASE) 50 MCG/ACT nasal spray, USE 2 SPRAYS IN EACH NOSTRIL EVERY DAY, Disp: 16 g, Rfl: 2   JANUVIA 100 MG tablet, TAKE 1 TABLET BY MOUTH EVERY DAY, Disp: 30 tablet, Rfl: 1   Lancets (ONETOUCH DELICA PLUS EYCXKG81E) MISC, USE TO CHECK BLOOD SUGAR ONCE DAILY AS DIRECTED, Disp: 100 each, Rfl: 1   latanoprost (XALATAN) 0.005 % ophthalmic solution, Place 1 drop into both eyes at bedtime., Disp: , Rfl:    ONETOUCH ULTRA test strip, USE TO CHECK BLOOD SUGAR 1 time DAILY., Disp: 150 strip, Rfl: 1   rosuvastatin (CRESTOR) 20 MG tablet, TAKE 1 TABLET BY MOUTH EVERY DAY, Disp: 90 tablet, Rfl: 1   SYNTHROID 112 MCG tablet,  TAKE 1 TABLET BY MOUTH Mon-Sat AND TAKE 1/2 TABLET BY MOUTH ON SUNDAY, Disp: 30 tablet, Rfl: 1   telmisartan-hydrochlorothiazide (MICARDIS HCT) 40-12.5 MG tablet, TAKE 1 TABLET BY MOUTH EVERY DAY, Disp: 90 tablet, Rfl: 1   Allergies  Allergen Reactions   Banana Other (See Comments)    Tingling in mouth.   Sulfa Antibiotics Rash     Review of Systems  Constitutional: Negative.   Eyes:  Negative for blurred vision.  Respiratory: Negative.  Negative for shortness of breath.    Cardiovascular: Negative.  Negative for chest pain and palpitations.  Endocrine: Negative for polydipsia, polyphagia and polyuria.  Neurological: Negative.   Psychiatric/Behavioral: Negative.      Today's Vitals   06/01/21 1452  BP: 118/60  Pulse: 64  Temp: 98 F (36.7 C)  Weight: 179 lb 0.2 oz (81.2 kg)  Height: 5' 3.8" (1.621 m)   Body mass index is 30.92 kg/m.   Objective:  Physical Exam Vitals and nursing note reviewed.  Constitutional:      Appearance: Normal appearance.  HENT:     Head: Normocephalic and atraumatic.     Nose:     Comments: Masked     Mouth/Throat:     Comments: Masked  Eyes:     Extraocular Movements: Extraocular movements intact.  Cardiovascular:     Rate and Rhythm: Normal rate and regular rhythm.     Heart sounds: Normal heart sounds.  Pulmonary:     Effort: Pulmonary effort is normal.     Breath sounds: Normal breath sounds.  Musculoskeletal:     Cervical back: Normal range of motion.  Skin:    General: Skin is warm.  Neurological:     General: No focal deficit present.     Mental Status: She is alert.  Psychiatric:        Mood and Affect: Mood normal.        Behavior: Behavior normal.        Assessment And Plan:     1. Type 2 diabetes mellitus with stage 2 chronic kidney disease, without long-term current use of insulin (HCC) Comments: Chronic, previous labs as below.  I will check labs as below. She will rto in 4 months for re-evaluation. She does not wish to switch to [place] at this time. - CMP14+EGFR - Hemoglobin A1c  2. Hypertensive nephropathy Comments: Chronic, well controlled. No med changes. Advised to follow low sodium diet.   3. Primary hypothyroidism Comments: Last TSH within normal limits. I will defer repeat testing until her next visit.  She will c/w Synthroid 161mcg M-Sat, 1/2 on Sunday.   4. Class 1 obesity due to excess calories with serious comorbidity and body mass index (BMI) of 30.0 to 30.9 in  adult Comments: Her BMI is acceptable for her demographic. She is encouraged to aim for at least 150 minutes of exercise per week.    Patient was given opportunity to ask questions. Patient verbalized understanding of the plan and was able to repeat key elements of the plan. All questions were answered to their satisfaction.   I, [name], MD, have reviewed all documentation for this visit. The documentation on 06/01/21 for the exam, diagnosis, procedures, and orders are all accurate and complete.   IF YOU HAVE BEEN REFERRED TO A SPECIALIST, IT MAY TAKE 1-2 WEEKS TO SCHEDULE/PROCESS THE REFERRAL. IF YOU HAVE NOT HEARD FROM US/SPECIALIST IN TWO WEEKS, PLEASE GIVE [place] A CALL AT [phone].   THE  PATIENT IS ENCOURAGED TO PRACTICE SOCIAL DISTANCING DUE TO THE COVID-19 PANDEMIC.

## 2021-06-01 NOTE — Patient Instructions (Signed)

## 2021-06-01 NOTE — Progress Notes (Signed)
This visit occurred during the SARS-CoV-2 public health emergency.  Safety protocols were in place, including screening questions prior to the visit, additional usage of staff PPE, and extensive cleaning of exam room while observing appropriate contact time as indicated for disinfecting solutions.  Subjective:   ASANI UNSWORTH is a 71 y.o. female who presents for Medicare Annual (Subsequent) preventive examination.  Review of Systems     Cardiac Risk Factors include: advanced age (>64men, >70 women);diabetes mellitus;hypertension;obesity (BMI >30kg/m2)     Objective:    Today's Vitals   06/01/21 1440  BP: 118/60  Pulse: 64  Temp: 98 F (36.7 C)  TempSrc: Oral  Weight: 179 lb (81.2 kg)  Height: 5' 3.8" (1.621 m)   Body mass index is 30.92 kg/m.  Advanced Directives 06/01/2021 05/26/2020 10/01/2019 09/16/2018 10/10/2017  Does Patient Have a Medical Advance Directive? No No No No No  Would patient like information on creating a medical advance directive? No - Patient declined No - Patient declined No - Patient declined - -    Current Medications (verified) Outpatient Encounter Medications as of 06/01/2021  Medication Sig   aspirin EC 81 MG tablet Take 81 mg by mouth daily.   cetirizine (ZYRTEC) 10 MG tablet Take 10 mg by mouth daily as needed for allergies. prn   cholecalciferol (VITAMIN D) 1000 UNITS tablet Take 1,000 Units by mouth daily.    docusate sodium (COLACE) 100 MG capsule Take by mouth.   fluticasone (FLONASE) 50 MCG/ACT nasal spray USE 2 SPRAYS IN EACH NOSTRIL EVERY DAY   JANUVIA 100 MG tablet TAKE 1 TABLET BY MOUTH EVERY DAY   Lancets (ONETOUCH DELICA PLUS [id number]) MISC USE TO CHECK BLOOD SUGAR ONCE DAILY AS DIRECTED   latanoprost (XALATAN) 0.005 % ophthalmic solution Place 1 drop into both eyes at bedtime.   ONETOUCH ULTRA test strip USE TO CHECK BLOOD SUGAR 1 time DAILY.   rosuvastatin (CRESTOR) 20 MG tablet TAKE 1 TABLET BY MOUTH EVERY DAY   SYNTHROID 112 MCG  tablet TAKE 1 TABLET BY MOUTH Mon-Sat AND TAKE 1/2 TABLET BY MOUTH ON SUNDAY   telmisartan-hydrochlorothiazide (MICARDIS HCT) 40-12.5 MG tablet TAKE 1 TABLET BY MOUTH EVERY DAY   COVID-19 mRNA bivalent vaccine, Pfizer, (PFIZER COVID-19 VAC BIVALENT) injection Inject into the muscle.   No facility-administered encounter medications on file as of 06/01/2021.    Allergies (verified) Banana and Sulfa antibiotics   History: Past Medical History:  Diagnosis Date   Diabetes mellitus without complication ([place])    Essential hypertension, benign 12/28/2013   Hypertension    Pure hypercholesterolemia 12/28/2013   Thyroid disease    Past Surgical History:  Procedure Laterality Date   ABDOMINAL HYSTERECTOMY     ORIF ANKLE FRACTURE Right 10/10/2017   Procedure: OPEN REDUCTION INTERNAL FIXATION (ORIF) ANKLE FRACTURE;  Surgeon: Hessie Knows, MD;  Location: [facility] ORS;  Service: Orthopedics;  Laterality: Right;   Family History  Problem Relation Age of Onset   Hypertension Father    Kidney disease Father    Bradycardia Mother        pacemaker   Breast cancer Sister    Kidney disease Brother    Social History   Socioeconomic History   Marital status: Married    Spouse name: Not on file   Number of children: Not on file   Years of education: Not on file   Highest education level: Not on file  Occupational History   Not on file  Tobacco Use   Smoking  status: Never   Smokeless tobacco: Never  Vaping Use   Vaping Use: Never used  Substance and Sexual Activity   Alcohol use: Not Currently   Drug use: Never   Sexual activity: Yes  Other Topics Concern   Not on file  Social History Narrative   Not on file   Social Determinants of Health   Financial Resource Strain: Low Risk    Difficulty of Paying Living Expenses: Not hard at all  Food Insecurity: No Food Insecurity   Worried About Programme researcher, broadcasting/film/video in the Last Year: Never true   Ran Out of Food in the Last Year: Never true   Transportation Needs: No Transportation Needs   Lack of Transportation (Medical): No   Lack of Transportation (Non-Medical): No  Physical Activity: Inactive   Days of Exercise per Week: 0 days   Minutes of Exercise per Session: 0 min  Stress: No Stress Concern Present   Feeling of Stress : Only a little  Social Connections: Not on file    Tobacco Counseling Counseling given: Not Answered   Clinical Intake:  Pre-visit preparation completed: Yes  Pain : No/denies pain     Nutritional Status: BMI > 30  Obese Nutritional Risks: None Diabetes: Yes  How often do you need to have someone help you when you read instructions, pamphlets, or other written materials from your doctor or pharmacy?: 1 - Never What is the last grade level you completed in school?: college  Diabetic? Yes Nutrition Risk Assessment:  Has the patient had any N/V/D within the last 2 months?  No  Does the patient have any non-healing wounds?  No  Has the patient had any unintentional weight loss or weight gain?  No   Diabetes:  Is the patient diabetic?  Yes  If diabetic, was a CBG obtained today?  No  Did the patient bring in their glucometer from home?  No  How often do you monitor your CBG's? Once every 2 weeks.   Financial Strains and Diabetes Management:  Are you having any financial strains with the device, your supplies or your medication? No .  Does the patient want to be seen by Chronic Care Management for management of their diabetes?  No  Would the patient like to be referred to a Nutritionist or for Diabetic Management?  No   Diabetic Exams:  Diabetic Eye Exam: Completed 08/11/2020 Diabetic Foot Exam: Completed 01/30/2021   Interpreter Needed?: No  Information entered by :: NAllen LPN   Activities of Daily Living In your present state of health, do you have any difficulty performing the following activities: 06/01/2021 06/01/2021  Hearing? Y N  Comment a little bit -  Vision? N N   Difficulty concentrating or making decisions? N N  Walking or climbing stairs? N N  Dressing or bathing? N N  Doing errands, shopping? N N  Preparing Food and eating ? N N  Using the Toilet? N N  In the past six months, have you accidently leaked urine? N N  Do you have problems with loss of bowel control? N N  Managing your Medications? N N  Managing your Finances? N N  Housekeeping or managing your Housekeeping? N N  Some recent data might be hidden    Patient Care Team: Dorothyann Peng, MD as PCP - General (Internal Medicine)  Indicate any recent Medical Services you may have received from other than [facility] providers in the past year (date may be approximate).  Assessment:   This is a routine wellness examination for [place].  Hearing/Vision screen Vision Screening - Comments:: Regular eye exams, Dr. Herbert Deaner  Dietary issues and exercise activities discussed: Current Exercise Habits: The patient does not participate in regular exercise at present   Goals Addressed             This Visit's Progress    Patient Stated       06/01/2021, take it easy       Depression Screen PHQ 2/9 Scores 06/01/2021 05/26/2020 01/20/2020 10/01/2019 04/15/2019 12/11/2018 09/16/2018  PHQ - 2 Score 0 0 0 0 0 0 0  PHQ- 9 Score - - - 0 - - 0    Fall Risk Fall Risk  06/01/2021 06/01/2021 05/26/2020 10/01/2019 04/15/2019  Falls in the past year? 0 0 0 0 0  Number falls in past yr: - 0 - - -  Injury with Fall? - 0 - - -  Risk for fall due to : Medication side effect - Medication side effect Medication side effect -  Follow up Falls evaluation completed;Education provided;Falls prevention discussed - Falls evaluation completed;Education provided;Falls prevention discussed Falls evaluation completed;Education provided;Falls prevention discussed -    FALL RISK PREVENTION PERTAINING TO THE HOME:  Any stairs in or around the home? Yes  If so, are there any without handrails? No  Home free of loose throw  rugs in walkways, pet beds, electrical cords, etc? Yes  Adequate lighting in your home to reduce risk of falls? Yes   ASSISTIVE DEVICES UTILIZED TO PREVENT FALLS:  Life alert? No  Use of a cane, walker or w/c? No  Grab bars in the bathroom? No  Shower chair or bench in shower? Yes  Elevated toilet seat or a handicapped toilet? Yes   TIMED UP AND GO:  Was the test performed? No .    Gait steady and fast without use of assistive device  Cognitive Function:     6CIT Screen 06/01/2021 05/26/2020 10/01/2019 09/16/2018  What Year? 0 points 0 points 0 points 0 points  What month? 0 points 0 points 0 points 0 points  What time? 0 points 0 points 0 points 0 points  Count back from 20 0 points 0 points 0 points 0 points  Months in reverse 0 points 0 points 0 points 0 points  Repeat phrase 6 points 4 points 0 points 0 points  Total Score 6 4 0 0    Immunizations Immunization History  Administered Date(s) Administered   Fluad Quad(high Dose 65+) 01/20/2020, 02/27/2021   Influenza, High Dose Seasonal PF 02/04/2018   Influenza,inj,Quad PF,6+ Mos 03/14/2017   Influenza-Unspecified 02/10/2019   PFIZER Comirnaty(Gray Top)Covid-19 Tri-Sucrose Vaccine 08/15/2020   PFIZER(Purple Top)SARS-COV-2 Vaccination 05/09/2019, 05/29/2019, 02/19/2020   Pfizer Covid-19 Vaccine Bivalent Booster 81yrs & up 03/23/2021   Pneumococcal Conjugate-13 09/16/2018   Pneumococcal Polysaccharide-23 07/20/2014   Tdap 12/31/2018    TDAP status: Up to date  Flu Vaccine status: Up to date  Pneumococcal vaccine status: Up to date  Covid-19 vaccine status: Completed vaccines  Qualifies for Shingles Vaccine? Yes   Zostavax completed No   Shingrix Completed?: No.    Education has been provided regarding the importance of this vaccine. Patient has been advised to call insurance company to determine out of pocket expense if they have not yet received this vaccine. Advised may also receive vaccine at local pharmacy or  Health Dept. Verbalized acceptance and understanding.  Screening Tests Health Maintenance  Topic Date Due  Zoster Vaccines- Shingrix (1 of 2) Never done   Pneumonia Vaccine 87+ Years old (3 - PPSV23 if available, else PCV20) 06/01/2022 (Originally 09/16/2019)   MAMMOGRAM  06/16/2021   HEMOGLOBIN A1C  07/30/2021   OPHTHALMOLOGY EXAM  08/11/2021   FOOT EXAM  01/30/2022   COLONOSCOPY (Pts 45-22yrs Insurance coverage will need to be confirmed)  12/19/2022   TETANUS/TDAP  12/31/2028   INFLUENZA VACCINE  Completed   DEXA SCAN  Completed   COVID-19 Vaccine  Completed   Hepatitis C Screening  Completed   HPV VACCINES  Aged Out    Health Maintenance  Health Maintenance Due  Topic Date Due   Zoster Vaccines- Shingrix (1 of 2) Never done    Colorectal cancer screening: Type of screening: Colonoscopy. Completed 12/18/2012. Repeat every 10 years  Mammogram status: Completed 06/16/2020. Repeat every year  Bone Density status: Completed 06/05/2018.   Lung Cancer Screening: (Low Dose CT Chest recommended if Age 70-80 years, 30 pack-year currently smoking OR have quit w/in 15years.) does not qualify.   Lung Cancer Screening Referral: no  Additional Screening:  Hepatitis C Screening: does qualify; Completed 10/18/2015  Vision Screening: Recommended annual ophthalmology exams for early detection of glaucoma and other disorders of the eye. Is the patient up to date with their annual eye exam?  Yes  Who is the provider or what is the name of the office in which the patient attends annual eye exams? Dr. Herbert Deaner  If pt is not established with a provider, would they like to be referred to a provider to establish care? No .   Dental Screening: Recommended annual dental exams for proper oral hygiene  Community Resource Referral / Chronic Care Management: CRR required this visit?  No   CCM required this visit?  No      Plan:     I have personally reviewed and noted the following in the  patients chart:   Medical and social history Use of alcohol, tobacco or illicit drugs  Current medications and supplements including opioid prescriptions.  Functional ability and status Nutritional status Physical activity Advanced directives List of other physicians Hospitalizations, surgeries, and ER visits in previous 12 months Vitals Screenings to include cognitive, depression, and falls Referrals and appointments  In addition, I have reviewed and discussed with patient certain preventive protocols, quality metrics, and best practice recommendations. A written personalized care plan for preventive services as well as general preventive health recommendations were provided to patient.     Kellie Simmering, LPN   [phone]   Nurse Notes: none

## 2021-06-01 NOTE — Patient Instructions (Signed)
Kelli Bond , Thank you for taking time to come for your Medicare Wellness Visit. I appreciate your ongoing commitment to your health goals. Please review the following plan we discussed and let me know if I can assist you in the future.   Screening recommendations/referrals: Colonoscopy: completed 12/18/2012 Mammogram: completed 06/16/2020 Bone Density: completed 06/05/2018 Recommended yearly ophthalmology/optometry visit for glaucoma screening and checkup Recommended yearly dental visit for hygiene and checkup  Vaccinations: Influenza vaccine: completed 02/27/2021 Pneumococcal vaccine: completed 09/16/2018 Tdap vaccine: completed 12/31/2018, due 12/30/2028 Shingles vaccine: discussed   Covid-19: 03/23/2021, 08/15/2020, 02/19/2020, 05/29/2019, 05/09/2019  Advanced directives: Advance directive discussed with you today. Even though you declined this today please call our office should you change your mind and we can give you the proper paperwork for you to fill out.  Conditions/risks identified: none  Next appointment: Follow up in one year for your annual wellness visit    Preventive Care 65 Years and Older, Female Preventive care refers to lifestyle choices and visits with your health care provider that can promote health and wellness. What does preventive care include? A yearly physical exam. This is also called an annual well check. Dental exams once or twice a year. Routine eye exams. Ask your health care provider how often you should have your eyes checked. Personal lifestyle choices, including: Daily care of your teeth and gums. Regular physical activity. Eating a healthy diet. Avoiding tobacco and drug use. Limiting alcohol use. Practicing safe sex. Taking low-dose aspirin every day. Taking vitamin and mineral supplements as recommended by your health care provider. What happens during an annual well check? The services and screenings done by your health care provider during your  annual well check will depend on your age, overall health, lifestyle risk factors, and family history of disease. Counseling  Your health care provider may ask you questions about your: Alcohol use. Tobacco use. Drug use. Emotional well-being. Home and relationship well-being. Sexual activity. Eating habits. History of falls. Memory and ability to understand (cognition). Work and work Astronomer. Reproductive health. Screening  You may have the following tests or measurements: Height, weight, and BMI. Blood pressure. Lipid and cholesterol levels. These may be checked every 5 years, or more frequently if you are over 84 years old. Skin check. Lung cancer screening. You may have this screening every year starting at age 23 if you have a 30-pack-year history of smoking and currently smoke or have quit within the past 15 years. Fecal occult blood test (FOBT) of the stool. You may have this test every year starting at age 52. Flexible sigmoidoscopy or colonoscopy. You may have a sigmoidoscopy every 5 years or a colonoscopy every 10 years starting at age 75. Hepatitis C blood test. Hepatitis B blood test. Sexually transmitted disease (STD) testing. Diabetes screening. This is done by checking your blood sugar (glucose) after you have not eaten for a while (fasting). You may have this done every 1-3 years. Bone density scan. This is done to screen for osteoporosis. You may have this done starting at age 81. Mammogram. This may be done every 1-2 years. Talk to your health care provider about how often you should have regular mammograms. Talk with your health care provider about your test results, treatment options, and if necessary, the need for more tests. Vaccines  Your health care provider may recommend certain vaccines, such as: Influenza vaccine. This is recommended every year. Tetanus, diphtheria, and acellular pertussis (Tdap, Td) vaccine. You may need a Td booster every  10  years. Zoster vaccine. You may need this after age 73. Pneumococcal 13-valent conjugate (PCV13) vaccine. One dose is recommended after age 34. Pneumococcal polysaccharide (PPSV23) vaccine. One dose is recommended after age 32. Talk to your health care provider about which screenings and vaccines you need and how often you need them. This information is not intended to replace advice given to you by your health care provider. Make sure you discuss any questions you have with your health care provider. Document Released: 05/20/2015 Document Revised: 01/11/2016 Document Reviewed: 02/22/2015 Elsevier Interactive Patient Education  2017 [place] Prevention in the Home Falls can cause injuries. They can happen to people of all ages. There are many things you can do to make your home safe and to help prevent falls. What can I do on the outside of my home? Regularly fix the edges of walkways and driveways and fix any cracks. Remove anything that might make you trip as you walk through a door, such as a raised step or threshold. Trim any bushes or trees on the path to your home. Use bright outdoor lighting. Clear any walking paths of anything that might make someone trip, such as rocks or tools. Regularly check to see if handrails are loose or broken. Make sure that both sides of any steps have handrails. Any raised decks and porches should have guardrails on the edges. Have any leaves, snow, or ice cleared regularly. Use sand or salt on walking paths during winter. Clean up any spills in your garage right away. This includes oil or grease spills. What can I do in the bathroom? Use night lights. Install grab bars by the toilet and in the tub and shower. Do not use towel bars as grab bars. Use non-skid mats or decals in the tub or shower. If you need to sit down in the shower, use a plastic, non-slip stool. Keep the floor dry. Clean up any water that spills on the floor as soon as it  happens. Remove soap buildup in the tub or shower regularly. Attach bath mats securely with double-sided non-slip rug tape. Do not have throw rugs and other things on the floor that can make you trip. What can I do in the bedroom? Use night lights. Make sure that you have a light by your bed that is easy to reach. Do not use any sheets or blankets that are too big for your bed. They should not hang down onto the floor. Have a firm chair that has side arms. You can use this for support while you get dressed. Do not have throw rugs and other things on the floor that can make you trip. What can I do in the kitchen? Clean up any spills right away. Avoid walking on wet floors. Keep items that you use a lot in easy-to-reach places. If you need to reach something above you, use a strong step stool that has a grab bar. Keep electrical cords out of the way. Do not use floor polish or wax that makes floors slippery. If you must use wax, use non-skid floor wax. Do not have throw rugs and other things on the floor that can make you trip. What can I do with my stairs? Do not leave any items on the stairs. Make sure that there are handrails on both sides of the stairs and use them. Fix handrails that are broken or loose. Make sure that handrails are as long as the stairways. Check any carpeting to make  sure that it is firmly attached to the stairs. Fix any carpet that is loose or worn. Avoid having throw rugs at the top or bottom of the stairs. If you do have throw rugs, attach them to the floor with carpet tape. Make sure that you have a light switch at the top of the stairs and the bottom of the stairs. If you do not have them, ask someone to add them for you. What else can I do to help prevent falls? Wear shoes that: Do not have high heels. Have rubber bottoms. Are comfortable and fit you well. Are closed at the toe. Do not wear sandals. If you use a stepladder: Make sure that it is fully opened.  Do not climb a closed stepladder. Make sure that both sides of the stepladder are locked into place. Ask someone to hold it for you, if possible. Clearly mark and make sure that you can see: Any grab bars or handrails. First and last steps. Where the edge of each step is. Use tools that help you move around (mobility aids) if they are needed. These include: Canes. Walkers. Scooters. Crutches. Turn on the lights when you go into a dark area. Replace any light bulbs as soon as they burn out. Set up your furniture so you have a clear path. Avoid moving your furniture around. If any of your floors are uneven, fix them. If there are any pets around you, be aware of where they are. Review your medicines with your doctor. Some medicines can make you feel dizzy. This can increase your chance of falling. Ask your doctor what other things that you can do to help prevent falls. This information is not intended to replace advice given to you by your health care provider. Make sure you discuss any questions you have with your health care provider. Document Released: 02/17/2009 Document Revised: 09/29/2015 Document Reviewed: 05/28/2014 Elsevier Interactive Patient Education  2017 [employer].

## 2021-06-02 LAB — CMP14+EGFR
ALT: 20 IU/L (ref 0–32)
AST: 33 IU/L (ref 0–40)
Albumin/Globulin Ratio: 1.6 (ref 1.2–2.2)
Albumin: 4.6 g/dL (ref 3.8–4.8)
Alkaline Phosphatase: 84 IU/L (ref 44–121)
BUN/Creatinine Ratio: 14 (ref 12–28)
BUN: 17 mg/dL (ref 8–27)
Bilirubin Total: 0.2 mg/dL (ref 0.0–1.2)
CO2: 26 mmol/L (ref 20–29)
Calcium: 9.7 mg/dL (ref 8.7–10.3)
Chloride: 99 mmol/L (ref 96–106)
Creatinine, Ser: 1.23 mg/dL — ABNORMAL HIGH (ref 0.57–1.00)
Globulin, Total: 2.8 g/dL (ref 1.5–4.5)
Glucose: 85 mg/dL (ref 70–99)
Potassium: 4.7 mmol/L (ref 3.5–5.2)
Sodium: 138 mmol/L (ref 134–144)
Total Protein: 7.4 g/dL (ref 6.0–8.5)
eGFR: 48 mL/min/{1.73_m2} — ABNORMAL LOW (ref >59)

## 2021-06-02 LAB — HEMOGLOBIN A1C
Est. average glucose Bld gHb Est-mCnc: 134 mg/dL
Hgb A1c MFr Bld: 6.3 % — ABNORMAL HIGH (ref 4.8–5.6)

## 2021-06-05 ENCOUNTER — Other Ambulatory Visit: Payer: Self-pay | Admitting: Internal Medicine

## 2021-06-12 ENCOUNTER — Encounter: Payer: Self-pay | Admitting: Internal Medicine

## 2021-06-12 LAB — HM DIABETES EYE EXAM

## 2021-06-13 ENCOUNTER — Other Ambulatory Visit: Payer: Self-pay

## 2021-06-13 MED ORDER — TELMISARTAN-HCTZ 40-12.5 MG PO TABS: 1.0000 | ORAL_TABLET | Freq: Every day | ORAL | 1 refills | Status: DC

## 2021-06-13 MED ORDER — SYNTHROID 112 MCG PO TABS: ORAL_TABLET | ORAL | 1 refills | Status: DC

## 2021-06-13 MED ORDER — SITAGLIPTIN PHOSPHATE 100 MG PO TABS: 100.0000 mg | ORAL_TABLET | Freq: Every day | ORAL | 1 refills | Status: DC

## 2021-06-13 MED ORDER — ROSUVASTATIN CALCIUM 20 MG PO TABS: 20.0000 mg | ORAL_TABLET | Freq: Every day | ORAL | 1 refills | Status: DC

## 2021-06-19 LAB — HM MAMMOGRAPHY

## 2021-06-20 ENCOUNTER — Encounter: Payer: Self-pay | Admitting: Internal Medicine

## 2021-06-22 ENCOUNTER — Encounter: Payer: Self-pay | Admitting: Internal Medicine

## 2021-07-13 ENCOUNTER — Other Ambulatory Visit: Payer: Self-pay | Admitting: Internal Medicine

## 2021-10-05 ENCOUNTER — Ambulatory Visit: Payer: Medicare Other | Admitting: Internal Medicine

## 2021-10-12 ENCOUNTER — Ambulatory Visit (INDEPENDENT_AMBULATORY_CARE_PROVIDER_SITE_OTHER): Payer: Medicare Other | Admitting: Internal Medicine

## 2021-10-12 ENCOUNTER — Encounter: Payer: Self-pay | Admitting: Internal Medicine

## 2021-10-12 VITALS — BP 120/62 | HR 57 | Temp 97.9°F | Ht 63.8 in | Wt 176.8 lb

## 2021-10-12 DIAGNOSIS — Z6830 Body mass index (BMI) 30.0-30.9, adult: Secondary | ICD-10-CM

## 2021-10-12 DIAGNOSIS — I129 Hypertensive chronic kidney disease with stage 1 through stage 4 chronic kidney disease, or unspecified chronic kidney disease: Secondary | ICD-10-CM

## 2021-10-12 DIAGNOSIS — E6609 Other obesity due to excess calories: Secondary | ICD-10-CM | POA: Diagnosis not present

## 2021-10-12 DIAGNOSIS — E1122 Type 2 diabetes mellitus with diabetic chronic kidney disease: Secondary | ICD-10-CM

## 2021-10-12 DIAGNOSIS — N182 Chronic kidney disease, stage 2 (mild): Secondary | ICD-10-CM

## 2021-10-12 DIAGNOSIS — Z23 Encounter for immunization: Secondary | ICD-10-CM

## 2021-10-12 DIAGNOSIS — F4321 Adjustment disorder with depressed mood: Secondary | ICD-10-CM

## 2021-10-12 DIAGNOSIS — E039 Hypothyroidism, unspecified: Secondary | ICD-10-CM

## 2021-10-12 MED ORDER — TELMISARTAN-HCTZ 40-12.5 MG PO TABS: 1.0000 | ORAL_TABLET | Freq: Every day | ORAL | 1 refills | Status: DC

## 2021-10-12 MED ORDER — SITAGLIPTIN PHOSPHATE 100 MG PO TABS: 100.0000 mg | ORAL_TABLET | Freq: Every day | ORAL | 1 refills | Status: DC

## 2021-10-12 MED ORDER — ROSUVASTATIN CALCIUM 20 MG PO TABS
20.0000 mg | ORAL_TABLET | Freq: Every day | ORAL | 1 refills | Status: DC
Start: 2021-10-12 — End: 2022-02-26

## 2021-10-12 NOTE — Progress Notes (Signed)
Jeri Cos [place],acting as a Neurosurgeon for Gwynneth Aliment, MD.,have documented all relevant documentation on the behalf of Gwynneth Aliment, MD,as directed by  Gwynneth Aliment, MD while in the presence of Gwynneth Aliment, MD.  This visit occurred during the SARS-CoV-2 public health emergency.  Safety protocols were in place, including screening questions prior to the visit, additional usage of staff PPE, and extensive cleaning of exam room while observing appropriate contact time as indicated for disinfecting solutions.  Subjective:     Patient ID: Kelli Bond , female    DOB: Oct 10, 1950 , 71 y.o.   MRN: [id number]   Chief Complaint  Patient presents with   Diabetes    HPI  She is here today for diabetes, blood pressure, and thyroid f/u. She reports compliance with meds.   Diabetes She presents for her follow-up diabetic visit. She has type 2 diabetes mellitus. Her disease course has been stable. There are no hypoglycemic associated symptoms. Pertinent negatives for diabetes include no blurred vision, no chest pain, no polydipsia, no polyphagia and no polyuria. There are no hypoglycemic complications. Risk factors for coronary artery disease include diabetes mellitus, dyslipidemia, hypertension, obesity, sedentary lifestyle and post-menopausal. She participates in exercise intermittently. Eye exam is current.  Hypertension This is a chronic problem. The current episode started more than 1 year ago. The problem has been gradually improving since onset. The problem is controlled. Pertinent negatives include no blurred vision, chest pain, palpitations or shortness of breath. Risk factors for coronary artery disease include diabetes mellitus, dyslipidemia, post-menopausal state and sedentary lifestyle.     Past Medical History:  Diagnosis Date   Diabetes mellitus without complication (HCC)    Essential hypertension, benign 12/28/2013   Hypertension    Pure hypercholesterolemia 12/28/2013    Thyroid disease      Family History  Problem Relation Age of Onset   Hypertension Father    Kidney disease Father    Bradycardia Mother        pacemaker   Breast cancer Sister    Kidney disease Brother      Current Outpatient Medications:    aspirin EC 81 MG tablet, Take 81 mg by mouth daily., Disp: , Rfl:    cetirizine (ZYRTEC) 10 MG tablet, Take 10 mg by mouth daily as needed for allergies. prn, Disp: , Rfl:    cholecalciferol (VITAMIN D) 1000 UNITS tablet, Take 1,000 Units by mouth daily. , Disp: , Rfl:    docusate sodium (COLACE) 100 MG capsule, Take by mouth., Disp: , Rfl:    fluticasone (FLONASE) 50 MCG/ACT nasal spray, USE 2 SPRAYS IN EACH NOSTRIL EVERY DAY, Disp: 16 g, Rfl: 2   Lancets (ONETOUCH DELICA PLUS LANCET33G) MISC, USE TO CHECK BLOOD SUGAR ONCE DAILY AS DIRECTED, Disp: 100 each, Rfl: 1   latanoprost (XALATAN) 0.005 % ophthalmic solution, Place 1 drop into both eyes at bedtime., Disp: , Rfl:    ONETOUCH ULTRA test strip, USE TO CHECK BLOOD SUGAR 1 time DAILY., Disp: 150 strip, Rfl: 1   rosuvastatin (CRESTOR) 20 MG tablet, Take 1 tablet (20 mg total) by mouth daily., Disp: 90 tablet, Rfl: 1   sitaGLIPtin (JANUVIA) 100 MG tablet, Take 1 tablet (100 mg total) by mouth daily., Disp: 90 tablet, Rfl: 1   SYNTHROID 112 MCG tablet, Take one tablet by mouth Monday through Saturday. Take 1/2 tablet by mouth on sunday, Disp: 90 tablet, Rfl: 1   telmisartan-hydrochlorothiazide (MICARDIS HCT) 40-12.5 MG tablet, Take 1 tablet  by mouth daily., Disp: 90 tablet, Rfl: 1   Allergies  Allergen Reactions   Banana Other (See Comments)    Tingling in mouth.   Sulfa Antibiotics Rash     Review of Systems  Eyes:  Negative for blurred vision.  Respiratory:  Negative for shortness of breath.   Cardiovascular:  Negative for chest pain and palpitations.  Endocrine: Negative for polydipsia, polyphagia and polyuria.     Today's Vitals   10/12/21 1036  BP: 120/62  Pulse: (!) 57   Temp: 97.9 F (36.6 C)  Weight: 176 lb 12.8 oz (80.2 kg)  Height: 5' 3.8" (1.621 m)  PainSc: 0-No pain   Body mass index is 30.54 kg/m.  Wt Readings from Last 3 Encounters:  10/12/21 176 lb 12.8 oz (80.2 kg)  06/01/21 179 lb 0.2 oz (81.2 kg)  06/01/21 179 lb (81.2 kg)     Objective:  Physical Exam      Assessment And Plan:     1. Type 2 diabetes mellitus with stage 2 chronic kidney disease, without long-term current use of insulin ([place])  2. Hypertensive nephropathy  3. Class 1 obesity due to excess calories with serious comorbidity and body mass index (BMI) of 30.0 to 30.9 in adult  4. Immunization due     Patient was given opportunity to ask questions. Patient verbalized understanding of the plan and was able to repeat key elements of the plan. All questions were answered to their satisfaction.   I, [name], MD, have reviewed all documentation for this visit. The documentation on 10/12/21 for the exam, diagnosis, procedures, and orders are all accurate and complete.   IF YOU HAVE BEEN REFERRED TO A SPECIALIST, IT MAY TAKE 1-2 WEEKS TO SCHEDULE/PROCESS THE REFERRAL. IF YOU HAVE NOT HEARD FROM US/SPECIALIST IN TWO WEEKS, PLEASE GIVE [place] A CALL AT [phone].   THE PATIENT IS ENCOURAGED TO PRACTICE SOCIAL DISTANCING DUE TO THE COVID-19 PANDEMIC.

## 2021-10-12 NOTE — Patient Instructions (Signed)

## 2021-10-13 LAB — CMP14+EGFR
ALT: 27 IU/L (ref 0–32)
AST: 23 IU/L (ref 0–40)
Albumin/Globulin Ratio: 2 (ref 1.2–2.2)
Albumin: 4.3 g/dL (ref 3.8–4.8)
Alkaline Phosphatase: 72 IU/L (ref 44–121)
BUN/Creatinine Ratio: 11 — ABNORMAL LOW (ref 12–28)
BUN: 13 mg/dL (ref 8–27)
Bilirubin Total: 0.2 mg/dL (ref 0.0–1.2)
CO2: 27 mmol/L (ref 20–29)
Calcium: 9.8 mg/dL (ref 8.7–10.3)
Chloride: 101 mmol/L (ref 96–106)
Creatinine, Ser: 1.19 mg/dL — ABNORMAL HIGH (ref 0.57–1.00)
Globulin, Total: 2.2 g/dL (ref 1.5–4.5)
Glucose: 92 mg/dL (ref 70–99)
Potassium: 4.6 mmol/L (ref 3.5–5.2)
Sodium: 141 mmol/L (ref 134–144)
Total Protein: 6.5 g/dL (ref 6.0–8.5)
eGFR: 49 mL/min/{1.73_m2} — ABNORMAL LOW (ref >59)

## 2021-10-13 LAB — HEMOGLOBIN A1C
Est. average glucose Bld gHb Est-mCnc: 131 mg/dL
Hgb A1c MFr Bld: 6.2 % — ABNORMAL HIGH (ref 4.8–5.6)

## 2021-10-13 LAB — LIPID PANEL
Chol/HDL Ratio: 2.5 ratio (ref 0.0–4.4)
Cholesterol, Total: 118 mg/dL (ref 100–199)
HDL: 47 mg/dL (ref >39)
LDL Chol Calc (NIH): 53 mg/dL (ref 0–99)
Triglycerides: 91 mg/dL (ref 0–149)
VLDL Cholesterol Cal: 18 mg/dL (ref 5–40)

## 2021-10-24 ENCOUNTER — Ambulatory Visit (INDEPENDENT_AMBULATORY_CARE_PROVIDER_SITE_OTHER): Payer: Self-pay

## 2021-10-24 VITALS — BP 108/74 | Temp 98.1°F | Ht 63.0 in | Wt 176.0 lb

## 2021-10-24 DIAGNOSIS — Z23 Encounter for immunization: Secondary | ICD-10-CM

## 2021-10-24 NOTE — Progress Notes (Signed)
Pt presents today for first shingrix.  

## 2021-11-04 ENCOUNTER — Other Ambulatory Visit: Payer: Self-pay | Admitting: Internal Medicine

## 2022-01-25 ENCOUNTER — Ambulatory Visit: Payer: Medicare Other | Admitting: Internal Medicine

## 2022-01-31 ENCOUNTER — Other Ambulatory Visit: Payer: Self-pay | Admitting: Internal Medicine

## 2022-02-07 ENCOUNTER — Encounter: Payer: Self-pay | Admitting: Internal Medicine

## 2022-02-07 ENCOUNTER — Ambulatory Visit (INDEPENDENT_AMBULATORY_CARE_PROVIDER_SITE_OTHER): Payer: Medicare Other | Admitting: Internal Medicine

## 2022-02-07 ENCOUNTER — Other Ambulatory Visit (HOSPITAL_COMMUNITY)
Admission: RE | Admit: 2022-02-07 | Discharge: 2022-02-07 | Disposition: A | Discharge status: Home / Self Care | Payer: Medicare Other | Source: Ambulatory Visit | Attending: Internal Medicine | Admitting: Internal Medicine

## 2022-02-07 VITALS — BP 160/90 | Temp 97.9°F | Ht 62.6 in | Wt 189.2 lb

## 2022-02-07 DIAGNOSIS — E1122 Type 2 diabetes mellitus with diabetic chronic kidney disease: Secondary | ICD-10-CM | POA: Diagnosis not present

## 2022-02-07 DIAGNOSIS — N182 Chronic kidney disease, stage 2 (mild): Secondary | ICD-10-CM

## 2022-02-07 DIAGNOSIS — E6609 Other obesity due to excess calories: Secondary | ICD-10-CM

## 2022-02-07 DIAGNOSIS — Z0000 Encounter for general adult medical examination without abnormal findings: Secondary | ICD-10-CM | POA: Diagnosis not present

## 2022-02-07 DIAGNOSIS — Z01419 Encounter for gynecological examination (general) (routine) without abnormal findings: Secondary | ICD-10-CM | POA: Diagnosis present

## 2022-02-07 DIAGNOSIS — I129 Hypertensive chronic kidney disease with stage 1 through stage 4 chronic kidney disease, or unspecified chronic kidney disease: Secondary | ICD-10-CM | POA: Diagnosis not present

## 2022-02-07 DIAGNOSIS — E039 Hypothyroidism, unspecified: Secondary | ICD-10-CM

## 2022-02-07 DIAGNOSIS — Z6833 Body mass index (BMI) 33.0-33.9, adult: Secondary | ICD-10-CM

## 2022-02-07 LAB — POC HEMOCCULT BLD/STL (OFFICE/1-CARD/DIAGNOSTIC): Fecal Occult Blood, POC: NEGATIVE

## 2022-02-07 NOTE — Progress Notes (Signed)
[name],acting as a Education administrator for [name], MD.,have documented all relevant documentation on the behalf of [name], MD,as directed by  [name], MD while in the presence of [name], MD.   Subjective:     Patient ID: Kelli Bond , female    DOB: 03-18-1951 , 71 y.o.   MRN: [id number]   Chief Complaint  Patient presents with   Annual Exam   Diabetes   Hypertension    HPI  Patient presents today for her HM. She no longer sees a GYN provider. She reports compliance with meds. Denies headaches, chest pain and shortness of breath. She admits she rushed to her appt today (she came in earlier than originally scheduled as requested).  Diabetes She presents for her follow-up diabetic visit. She has type 2 diabetes mellitus. Her disease course has been stable. There are no hypoglycemic associated symptoms. Pertinent negatives for diabetes include no blurred vision. There are no hypoglycemic complications. Risk factors for coronary artery disease include diabetes mellitus, dyslipidemia, hypertension, obesity, sedentary lifestyle and post-menopausal. She is following a diabetic diet. She never participates in exercise. An ACE inhibitor/angiotensin II receptor blocker is being taken. Eye exam is not current.  Hypertension This is a chronic problem. The current episode started more than 1 year ago. The problem has been gradually improving since onset. The problem is controlled. Pertinent negatives include no blurred vision. Risk factors for coronary artery disease include diabetes mellitus, dyslipidemia, post-menopausal state and sedentary lifestyle. The current treatment provides moderate improvement.     Past Medical History:  Diagnosis Date   Diabetes mellitus without complication ([place])    Essential hypertension, benign 12/28/2013   Hypertension    Pure hypercholesterolemia 12/28/2013   Thyroid disease      Family History  Problem Relation Age of Onset    Hypertension Father    Kidney disease Father    Bradycardia Mother        pacemaker   Breast cancer Sister    Kidney disease Brother      Current Outpatient Medications:    aspirin EC 81 MG tablet, Take 81 mg by mouth daily., Disp: , Rfl:    cetirizine (ZYRTEC) 10 MG tablet, Take 10 mg by mouth daily as needed for allergies. prn, Disp: , Rfl:    cholecalciferol (VITAMIN D) 1000 UNITS tablet, Take 1,000 Units by mouth daily. , Disp: , Rfl:    docusate sodium (COLACE) 100 MG capsule, Take by mouth., Disp: , Rfl:    fluticasone (FLONASE) 50 MCG/ACT nasal spray, USE 2 SPRAYS IN EACH NOSTRIL EVERY DAY, Disp: 16 g, Rfl: 2   Lancets (ONETOUCH DELICA PLUS [id number]) MISC, USE TO CHECK BLOOD SUGAR ONCE DAILY AS DIRECTED, Disp: 100 each, Rfl: 1   latanoprost (XALATAN) 0.005 % ophthalmic solution, Place 1 drop into both eyes at bedtime., Disp: , Rfl:    ONETOUCH ULTRA test strip, USE TO CHECK BLOOD SUGAR 1 time DAILY., Disp: 150 strip, Rfl: 1   rosuvastatin (CRESTOR) 20 MG tablet, Take 1 tablet (20 mg total) by mouth daily., Disp: 90 tablet, Rfl: 1   sitaGLIPtin (JANUVIA) 100 MG tablet, Take 1 tablet (100 mg total) by mouth daily., Disp: 90 tablet, Rfl: 1   SYNTHROID 112 MCG tablet, TAKE 1 TABLET BY MOUTH monday THROUGH saturday and take 1/2 tablet ON sunday, Disp: 90 tablet, Rfl: 1   telmisartan-hydrochlorothiazide (MICARDIS HCT) 40-12.5 MG tablet, Take 1 tablet by mouth daily., Disp: 90 tablet,  Rfl: 1   predniSONE (DELTASONE) 10 MG tablet, Take by mouth., Disp: , Rfl:    Allergies  Allergen Reactions   Banana Other (See Comments)    Tingling in mouth.   Sulfa Antibiotics Rash      The patient states she uses post menopausal status for birth control. Last LMP was No LMP recorded. Patient has had a hysterectomy.. Negative for Dysmenorrhea. Negative for: breast discharge, breast lump(s), breast pain and breast self exam. Associated symptoms include abnormal vaginal bleeding. Pertinent  negatives include abnormal bleeding (hematology), anxiety, decreased libido, depression, difficulty falling sleep, dyspareunia, history of infertility, nocturia, sexual dysfunction, sleep disturbances, urinary incontinence, urinary urgency, vaginal discharge and vaginal itching. Diet regular.The patient states her exercise level is  intermittent.  . The patient's tobacco use is:  Social History   Tobacco Use  Smoking Status Never  Smokeless Tobacco Never  . She has been exposed to passive smoke. The patient's alcohol use is:  Social History   Substance and Sexual Activity  Alcohol Use Not Currently    Review of Systems  Constitutional: Negative.   HENT: Negative.    Eyes: Negative.  Negative for blurred vision.  Respiratory: Negative.    Cardiovascular: Negative.   Gastrointestinal: Negative.   Endocrine: Negative.   Genitourinary: Negative.   Musculoskeletal: Negative.   Skin: Negative.   Allergic/Immunologic: Negative.   Neurological: Negative.   Hematological: Negative.   Psychiatric/Behavioral: Negative.       Today's Vitals   02/07/22 0942  BP: (!) 160/90  Temp: 97.9 F (36.6 C)  Weight: 189 lb 3.2 oz (85.8 kg)  Height: 5' 2.6" (1.59 m)  PainSc: 0-No pain   Body mass index is 33.94 kg/m.  Wt Readings from Last 3 Encounters:  02/07/22 189 lb 3.2 oz (85.8 kg)  10/24/21 176 lb (79.8 kg)  10/12/21 176 lb 12.8 oz (80.2 kg)    BP Readings from Last 3 Encounters:  02/07/22 (!) 160/90  10/24/21 108/74  10/12/21 120/62    Objective:  Physical Exam Vitals and nursing note reviewed. Exam conducted with a chaperone present.  Constitutional:      Appearance: Normal appearance.  HENT:     Head: Normocephalic and atraumatic.     Right Ear: Tympanic membrane, ear canal and external ear normal.     Left Ear: Tympanic membrane, ear canal and external ear normal.     Nose:     Comments: Masked     Mouth/Throat:     Comments: Masked  Eyes:     Extraocular  Movements: Extraocular movements intact.     Conjunctiva/sclera: Conjunctivae normal.     Pupils: Pupils are equal, round, and reactive to light.  Cardiovascular:     Rate and Rhythm: Normal rate and regular rhythm.     Pulses: Normal pulses.          Dorsalis pedis pulses are 2+ on the right side and 2+ on the left side.     Heart sounds: Normal heart sounds.  Pulmonary:     Effort: Pulmonary effort is normal.     Breath sounds: Normal breath sounds.  Chest:  Breasts:    Tanner Score is 5.     Comments: She kept her bra on Abdominal:     General: Abdomen is flat. Bowel sounds are normal.     Palpations: Abdomen is soft.     Hernia: There is no hernia in the left inguinal area or right inguinal area.  Genitourinary:  General: Normal vulva.     Exam position: Lithotomy position.     Tanner stage (genital): 5.     Labia:        Right: No tenderness.        Left: No tenderness.      Vagina: Normal.     Uterus: Absent.      Adnexa:        Right: No tenderness.         Left: No tenderness.       Rectum: Normal. Guaiac result negative.     Comments: Cervix was absent Musculoskeletal:        General: Normal range of motion.     Cervical back: Normal range of motion and neck supple.  Feet:     Right foot:     Protective Sensation: 5 sites tested.  5 sites sensed.     Skin integrity: Skin integrity normal.     Toenail Condition: Right toenails are normal.     Left foot:     Protective Sensation: 5 sites tested.  5 sites sensed.     Skin integrity: Skin integrity normal.     Toenail Condition: Left toenails are normal.  Lymphadenopathy:     Lower Body: No right inguinal adenopathy. No left inguinal adenopathy.  Skin:    General: Skin is warm and dry.  Neurological:     General: No focal deficit present.     Mental Status: She is alert and oriented to person, place, and time.  Psychiatric:        Mood and Affect: Mood normal.        Behavior: Behavior normal.       Assessment And Plan:     1. Encounter for general adult medical examination w/o abnormal findings Comments: A full exam was performed, including pelvic exam. Importance of monthly self breast exams was discussed with the patient. PATIENT IS ADVISED TO GET 30-45 MINUTES REGULAR EXERCISE NO LESS THAN FOUR TO FIVE DAYS PER WEEK - BOTH WEIGHTBEARING EXERCISES AND AEROBIC ARE RECOMMENDED.  PATIENT IS ADVISED TO FOLLOW A HEALTHY DIET WITH AT LEAST SIX FRUITS/VEGGIES PER DAY, DECREASE INTAKE OF RED MEAT, AND TO INCREASE FISH INTAKE TO TWO DAYS PER WEEK.  MEATS/FISH SHOULD NOT BE FRIED, BAKED OR BROILED IS PREFERABLE.  IT IS ALSO IMPORTANT TO CUT BACK ON YOUR SUGAR INTAKE. PLEASE AVOID ANYTHING WITH ADDED SUGAR, CORN SYRUP OR OTHER SWEETENERS. IF YOU MUST USE A SWEETENER, YOU CAN TRY STEVIA. IT IS ALSO IMPORTANT TO AVOID ARTIFICIALLY SWEETENERS AND DIET BEVERAGES. LASTLY, I SUGGEST WEARING SPF 50 SUNSCREEN ON EXPOSED PARTS AND ESPECIALLY WHEN IN THE DIRECT SUNLIGHT FOR AN EXTENDED PERIOD OF TIME.  PLEASE AVOID FAST FOOD RESTAURANTS AND INCREASE YOUR WATER INTAKE.  2. Cervical smear, as part of routine gynecological examination Comments: Pap smear performed, she is s/p hysterectomy.  Stool is heme negative.  - Cytology -Pap Smear - POC Hemoccult Bld/Stl (1-Cd Office Dx)  3. Type 2 diabetes mellitus with stage 2 chronic kidney disease, without long-term current use of insulin (HCC) Comments: Chronic, diabetic foot exam was performed. She will f/u in 4 months. I DISCUSSED WITH THE PATIENT AT LENGTH REGARDING THE GOALS OF GLYCEMIC CONTROL AND POSSIBLE LONG-TERM COMPLICATIONS.  I  ALSO STRESSED THE IMPORTANCE OF COMPLIANCE WITH HOME GLUCOSE MONITORING, DIETARY RESTRICTIONS INCLUDING AVOIDANCE OF SUGARY DRINKS/PROCESSED FOODS,  ALONG WITH REGULAR EXERCISE.  I  ALSO STRESSED THE IMPORTANCE OF ANNUAL EYE EXAMS, SELF FOOT CARE AND COMPLIANCE  WITH OFFICE VISITS.  - POCT Urinalysis Dipstick (81002) - Microalbumin /  Creatinine Urine Ratio - EKG 12-Lead - CBC - CMP14+EGFR - Hemoglobin A1c  4. Hypertensive nephropathy Comments: Chronic, uncontrolled. Currently on prednisone for Baker's cyst. EKG performed, NSR w/o acute changes. BP usually well controlled, no med changes. She will c/w telmisartan/hctz 40/12.5mg  daily for now. I will have her return in 2-3 weeks for a nurse visit to re-evaluate her blood pressure.   5. Primary hypothyroidism Comments: Chronic, I will check thyroid panel and adjust meds as needed. She will f/u in six months for re-evaluation.  - TSH + free T4  6. Class 1 obesity due to excess calories with serious comorbidity and body mass index (BMI) of 33.0 to 33.9 in adult Comments: She is encouraged to aim for at least 150 minutes of exercise per week, while striving for BMI<30 to decrease cardiac risk.   Patient was given opportunity to ask questions. Patient verbalized understanding of the plan and was able to repeat key elements of the plan. All questions were answered to their satisfaction.   I, [name], MD, have reviewed all documentation for this visit. The documentation on 02/07/22 for the exam, diagnosis, procedures, and orders are all accurate and complete.   THE PATIENT IS ENCOURAGED TO PRACTICE SOCIAL DISTANCING DUE TO THE COVID-19 PANDEMIC.

## 2022-02-07 NOTE — Patient Instructions (Signed)

## 2022-02-12 LAB — CYTOLOGY - PAP
Comment: NEGATIVE
Diagnosis: NEGATIVE
High risk HPV: NEGATIVE

## 2022-02-26 ENCOUNTER — Other Ambulatory Visit: Payer: Self-pay | Admitting: Internal Medicine

## 2022-02-26 DIAGNOSIS — I129 Hypertensive chronic kidney disease with stage 1 through stage 4 chronic kidney disease, or unspecified chronic kidney disease: Secondary | ICD-10-CM

## 2022-02-26 DIAGNOSIS — E1122 Type 2 diabetes mellitus with diabetic chronic kidney disease: Secondary | ICD-10-CM

## 2022-03-01 ENCOUNTER — Ambulatory Visit: Payer: Medicare Other

## 2022-03-02 ENCOUNTER — Other Ambulatory Visit: Payer: Self-pay | Admitting: Orthopedic Surgery

## 2022-03-02 DIAGNOSIS — M25562 Pain in left knee: Secondary | ICD-10-CM

## 2022-03-14 ENCOUNTER — Ambulatory Visit
Admission: RE | Admit: 2022-03-14 | Discharge: 2022-03-14 | Disposition: A | Discharge status: Home / Self Care | Payer: Medicare Other | Source: Ambulatory Visit | Attending: Orthopedic Surgery | Admitting: Orthopedic Surgery

## 2022-03-14 DIAGNOSIS — M25562 Pain in left knee: Secondary | ICD-10-CM

## 2022-03-15 ENCOUNTER — Other Ambulatory Visit: Payer: Self-pay | Admitting: Nurse Practitioner

## 2022-03-15 ENCOUNTER — Ambulatory Visit (INDEPENDENT_AMBULATORY_CARE_PROVIDER_SITE_OTHER): Payer: Medicare Other

## 2022-03-15 VITALS — BP 136/80 | HR 82 | Temp 98.3°F | Ht 62.0 in | Wt 189.0 lb

## 2022-03-15 DIAGNOSIS — I129 Hypertensive chronic kidney disease with stage 1 through stage 4 chronic kidney disease, or unspecified chronic kidney disease: Secondary | ICD-10-CM

## 2022-03-15 DIAGNOSIS — Z23 Encounter for immunization: Secondary | ICD-10-CM

## 2022-03-15 LAB — POCT URINALYSIS DIPSTICK
Bilirubin, UA: NEGATIVE
Blood, UA: NEGATIVE
Glucose, UA: NEGATIVE
Ketones, UA: NEGATIVE
Leukocytes, UA: NEGATIVE
Nitrite, UA: NEGATIVE
Protein, UA: NEGATIVE
Spec Grav, UA: >=1.03 — AB (ref 1.010–1.025)
Urobilinogen, UA: 0.2 E.U./dL
pH, UA: 7 (ref 5.0–8.0)

## 2022-03-15 MED ORDER — TELMISARTAN-HCTZ 80-12.5 MG PO TABS: 1.0000 | ORAL_TABLET | Freq: Every day | ORAL | 1 refills | Status: DC

## 2022-03-15 NOTE — Patient Instructions (Signed)
Hypertension, Adult ?Hypertension is another name for high blood pressure. High blood pressure forces your heart to work harder to pump blood. This can cause problems over time. ?There are two numbers in a blood pressure reading. There is a top number (systolic) over a bottom number (diastolic). It is best to have a blood pressure that is below 120/80. ?What are the causes? ?The cause of this condition is not known. Some other conditions can lead to high blood pressure. ?What increases the risk? ?Some lifestyle factors can make you more likely to develop high blood pressure: ?Smoking. ?Not getting enough exercise or physical activity. ?Being overweight. ?Having too much fat, sugar, calories, or salt (sodium) in your diet. ?Drinking too much alcohol. ?Other risk factors include: ?Having any of these conditions: ?Heart disease. ?Diabetes. ?High cholesterol. ?Kidney disease. ?Obstructive sleep apnea. ?Having a family history of high blood pressure and high cholesterol. ?Age. The risk increases with age. ?Stress. ?What are the signs or symptoms? ?High blood pressure may not cause symptoms. Very high blood pressure (hypertensive crisis) may cause: ?Headache. ?Fast or uneven heartbeats (palpitations). ?Shortness of breath. ?Nosebleed. ?Vomiting or feeling like you may vomit (nauseous). ?Changes in how you see. ?Very bad chest pain. ?Feeling dizzy. ?Seizures. ?How is this treated? ?This condition is treated by making healthy lifestyle changes, such as: ?Eating healthy foods. ?Exercising more. ?Drinking less alcohol. ?Your doctor may prescribe medicine if lifestyle changes do not help enough and if: ?Your top number is above 130. ?Your bottom number is above 80. ?Your personal target blood pressure may vary. ?Follow these instructions at home: ?Eating and drinking ? ?If told, follow the DASH eating plan. To follow this plan: ?Fill one half of your plate at each meal with fruits and vegetables. ?Fill one fourth of your plate  at each meal with whole grains. Whole grains include whole-wheat pasta, brown rice, and whole-grain bread. ?Eat or drink low-fat dairy products, such as skim milk or low-fat yogurt. ?Fill one fourth of your plate at each meal with low-fat (lean) proteins. Low-fat proteins include fish, chicken without skin, eggs, beans, and tofu. ?Avoid fatty meat, cured and processed meat, or chicken with skin. ?Avoid pre-made or processed food. ?Limit the amount of salt in your diet to less than 1,500 mg each day. ?Do not drink alcohol if: ?Your doctor tells you not to drink. ?You are pregnant, may be pregnant, or are planning to become pregnant. ?If you drink alcohol: ?Limit how much you have to: ?0-1 drink a day for women. ?0-2 drinks a day for men. ?Know how much alcohol is in your drink. In the [place] one drink equals one 12 oz bottle of beer (355 mL), one 5 oz glass of wine (148 mL), or one 1? oz glass of hard liquor (44 mL). ?Lifestyle ? ?Work with your doctor to stay at a healthy weight or to lose weight. Ask your doctor what the best weight is for you. ?Get at least 30 minutes of exercise that causes your heart to beat faster (aerobic exercise) most days of the week. This may include walking, swimming, or biking. ?Get at least 30 minutes of exercise that strengthens your muscles (resistance exercise) at least 3 days a week. This may include lifting weights or doing Pilates. ?Do not smoke or use any products that contain nicotine or tobacco. If you need help quitting, ask your doctor. ?Check your blood pressure at home as told by your doctor. ?Keep all follow-up visits. ?Medicines ?Take over-the-counter and prescription medicines   only as told by your doctor. Follow directions carefully. ?Do not skip doses of blood pressure medicine. The medicine does not work as well if you skip doses. Skipping doses also puts you at risk for problems. ?Ask your doctor about side effects or reactions to medicines that you should watch  for. ?Contact a doctor if: ?You think you are having a reaction to the medicine you are taking. ?You have headaches that keep coming back. ?You feel dizzy. ?You have swelling in your ankles. ?You have trouble with your vision. ?Get help right away if: ?You get a very bad headache. ?You start to feel mixed up (confused). ?You feel weak or numb. ?You feel faint. ?You have very bad pain in your: ?Chest. ?Belly (abdomen). ?You vomit more than once. ?You have trouble breathing. ?These symptoms may be an emergency. Get help right away. Call 911. ?Do not wait to see if the symptoms will go away. ?Do not drive yourself to the hospital. ?Summary ?Hypertension is another name for high blood pressure. ?High blood pressure forces your heart to work harder to pump blood. ?For most people, a normal blood pressure is less than 120/80. ?Making healthy choices can help lower blood pressure. If your blood pressure does not get lower with healthy choices, you may need to take medicine. ?This information is not intended to replace advice given to you by your health care provider. Make sure you discuss any questions you have with your health care provider. ?Document Revised: 02/09/2021 Document Reviewed: 02/09/2021 ?Elsevier Patient Education ? 2023 [employer] ? ?

## 2022-03-15 NOTE — Progress Notes (Signed)
Patient presents today for a bp check, 2nd shingrix and a urine.  Patient is currently taking Telmisartan-hydrochlorothiazide 40-12.5mg  BP Readings from Last 3 Encounters:  03/15/22 (!) 140/60  02/07/22 (!) 160/90  10/24/21 108/74  Per Provider- Arnette Felts, She has increased Telmisartan-hydrochlorothiazide 80-12.5mg  and return in 2 weeks.

## 2022-03-16 LAB — MICROALBUMIN / CREATININE URINE RATIO
Creatinine, Urine: 15.3 mg/dL
Microalb/Creat Ratio: <20 mg/g creat (ref 0–29)
Microalbumin, Urine: <3 ug/mL

## 2022-03-19 LAB — HM DIABETES EYE EXAM

## 2022-03-27 ENCOUNTER — Ambulatory Visit: Payer: Medicare Other

## 2022-04-03 ENCOUNTER — Other Ambulatory Visit: Payer: Self-pay | Admitting: Internal Medicine

## 2022-04-03 ENCOUNTER — Encounter: Payer: Self-pay | Admitting: Internal Medicine

## 2022-04-03 ENCOUNTER — Ambulatory Visit: Payer: Medicare Other

## 2022-04-03 VITALS — BP 128/80 | HR 94 | Temp 98.7°F

## 2022-04-03 DIAGNOSIS — I129 Hypertensive chronic kidney disease with stage 1 through stage 4 chronic kidney disease, or unspecified chronic kidney disease: Secondary | ICD-10-CM

## 2022-04-03 NOTE — Progress Notes (Signed)
Patient presents today for BPC. She is currently taking telmisartan-hctz 80/12.5mg .   BP Readings from Last 3 Encounters:  04/03/22 128/80  03/15/22 136/80  02/07/22 (!) 160/90   Provider would like patient to continue with current meds. be sure to exercise five days per week.

## 2022-04-04 HISTORY — PX: KNEE SURGERY: SHX244

## 2022-04-04 LAB — TSH+FREE T4
Free T4: 1.54 ng/dL (ref 0.82–1.77)
TSH: 2.01 u[IU]/mL (ref 0.450–4.500)

## 2022-04-04 LAB — CMP14+EGFR
ALT: 17 IU/L (ref 0–32)
AST: 20 IU/L (ref 0–40)
Albumin/Globulin Ratio: 1.7 (ref 1.2–2.2)
Albumin: 4.5 g/dL (ref 3.9–4.9)
Alkaline Phosphatase: 90 IU/L (ref 44–121)
BUN/Creatinine Ratio: 11 — ABNORMAL LOW (ref 12–28)
BUN: 12 mg/dL (ref 8–27)
Bilirubin Total: 0.3 mg/dL (ref 0.0–1.2)
CO2: 26 mmol/L (ref 20–29)
Calcium: 9.9 mg/dL (ref 8.7–10.3)
Chloride: 98 mmol/L (ref 96–106)
Creatinine, Ser: 1.13 mg/dL — ABNORMAL HIGH (ref 0.57–1.00)
Globulin, Total: 2.7 g/dL (ref 1.5–4.5)
Glucose: 96 mg/dL (ref 70–99)
Potassium: 4.7 mmol/L (ref 3.5–5.2)
Sodium: 137 mmol/L (ref 134–144)
Total Protein: 7.2 g/dL (ref 6.0–8.5)
eGFR: 52 mL/min/{1.73_m2} — ABNORMAL LOW (ref >59)

## 2022-04-04 LAB — CBC
Hematocrit: 43.5 % (ref 34.0–46.6)
Hemoglobin: 14.3 g/dL (ref 11.1–15.9)
MCH: 26.5 pg — ABNORMAL LOW (ref 26.6–33.0)
MCHC: 32.9 g/dL (ref 31.5–35.7)
MCV: 81 fL (ref 79–97)
Platelets: 295 10*3/uL (ref 150–450)
RBC: 5.4 x10E6/uL — ABNORMAL HIGH (ref 3.77–5.28)
RDW: 13.9 % (ref 11.7–15.4)
WBC: 6.5 10*3/uL (ref 3.4–10.8)

## 2022-04-04 LAB — HEMOGLOBIN A1C
Est. average glucose Bld gHb Est-mCnc: 140 mg/dL
Hgb A1c MFr Bld: 6.5 % — ABNORMAL HIGH (ref 4.8–5.6)

## 2022-05-11 ENCOUNTER — Other Ambulatory Visit: Payer: Self-pay | Admitting: Nurse Practitioner

## 2022-05-11 DIAGNOSIS — I129 Hypertensive chronic kidney disease with stage 1 through stage 4 chronic kidney disease, or unspecified chronic kidney disease: Secondary | ICD-10-CM

## 2022-06-13 ENCOUNTER — Ambulatory Visit (INDEPENDENT_AMBULATORY_CARE_PROVIDER_SITE_OTHER): Payer: Medicare Other

## 2022-06-13 ENCOUNTER — Ambulatory Visit: Payer: Medicare Other

## 2022-06-13 ENCOUNTER — Ambulatory Visit (INDEPENDENT_AMBULATORY_CARE_PROVIDER_SITE_OTHER): Payer: Medicare Other | Admitting: Internal Medicine

## 2022-06-13 ENCOUNTER — Encounter: Payer: Self-pay | Admitting: Internal Medicine

## 2022-06-13 VITALS — Ht 64.0 in | Wt 186.0 lb

## 2022-06-13 VITALS — BP 124/80 | HR 80 | Temp 97.4°F | Ht 64.0 in | Wt 191.2 lb

## 2022-06-13 DIAGNOSIS — I129 Hypertensive chronic kidney disease with stage 1 through stage 4 chronic kidney disease, or unspecified chronic kidney disease: Secondary | ICD-10-CM | POA: Diagnosis not present

## 2022-06-13 DIAGNOSIS — E039 Hypothyroidism, unspecified: Secondary | ICD-10-CM

## 2022-06-13 DIAGNOSIS — N182 Chronic kidney disease, stage 2 (mild): Secondary | ICD-10-CM | POA: Diagnosis not present

## 2022-06-13 DIAGNOSIS — Z0000 Encounter for general adult medical examination without abnormal findings: Secondary | ICD-10-CM | POA: Diagnosis not present

## 2022-06-13 DIAGNOSIS — Z6832 Body mass index (BMI) 32.0-32.9, adult: Secondary | ICD-10-CM

## 2022-06-13 DIAGNOSIS — E1122 Type 2 diabetes mellitus with diabetic chronic kidney disease: Secondary | ICD-10-CM | POA: Diagnosis not present

## 2022-06-13 DIAGNOSIS — E6609 Other obesity due to excess calories: Secondary | ICD-10-CM

## 2022-06-13 NOTE — Patient Instructions (Signed)
Kelli Bond , Thank you for taking time to come for your Medicare Wellness Visit. I appreciate your ongoing commitment to your health goals. Please review the following plan we discussed and let me know if I can assist you in the future.   These are the goals we discussed:  Goals      Patient Stated     10/01/2019, wants to exercise more (would like to start water aerobics)     Patient Stated     05/26/2020, wants to lose 5-10 pounds     Patient Stated     06/01/2021, take it easy     Patient Stated     06/13/2022, wants to lose weight     Weight (lb) < 200 lb (90.7 kg)     Would like to lose 20 pounds        This is a list of the screening recommended for you and due dates:  Health Maintenance  Topic Date Due   COVID-19 Vaccine (6 - 2023-24 season) 01/05/2022   Complete foot exam   01/30/2022   Mammogram  06/19/2022   Hemoglobin A1C  10/02/2022   Colon Cancer Screening  12/09/2022   Yearly kidney health urinalysis for diabetes  03/16/2023   Eye exam for diabetics  03/20/2023   Yearly kidney function blood test for diabetes  04/04/2023   Medicare Annual Wellness Visit  06/14/2023   Pneumonia Vaccine (3 - PPSV23 or PCV20) 09/16/2023   DTaP/Tdap/Td vaccine (2 - Td or Tdap) 12/30/2028   Flu Shot  Completed   DEXA scan (bone density measurement)  Completed   Hepatitis C Screening: USPSTF Recommendation to screen - Ages 3-79 yo.  Completed   Zoster (Shingles) Vaccine  Completed   HPV Vaccine  Aged Out    Advanced directives: Please bring a copy of your POA (Power of [place]) and/or Living Will to your next appointment.   Conditions/risks identified: none  Next appointment: Follow up in one year for your annual wellness visit    Preventive Care 65 Years and Older, Female Preventive care refers to lifestyle choices and visits with your health care provider that can promote health and wellness. What does preventive care include? A yearly physical exam. This is also called  an annual well check.72 Dental exams once or twice a year. Routine eye exams. Ask your health care provider how often you should have your eyes checked. Personal lifestyle choices, including: Daily care of your teeth and gums. Regular physical activity. Eating a healthy diet. Avoiding tobacco and drug use. Limiting alcohol use. Practicing safe sex. Taking low-dose aspirin every day. Taking vitamin and mineral supplements as recommended by your health care provider. What happens during an annual well check? The services and screenings done by your health care provider during your annual well check will depend on your age, overall health, lifestyle risk factors, and family history of disease. Counseling  Your health care provider may ask you questions about your: Alcohol use. Tobacco use. Drug use. Emotional well-being. Home and relationship well-being. Sexual activity. Eating habits. History of falls. Memory and ability to understand (cognition). Work and work Statistician. Reproductive health. Screening  You may have the following tests or measurements: Height, weight, and BMI. Blood pressure. Lipid and cholesterol levels. These may be checked every 5 years, or more frequently if you are over 71 years old. Skin check. Lung cancer screening. You may have this screening every year starting at age 71 if you have a 30-pack-year history of  smoking and currently smoke or have quit within the past 15 years. Fecal occult blood test (FOBT) of the stool. You may have this test every year starting at age 71. Flexible sigmoidoscopy or colonoscopy. You may have a sigmoidoscopy every 5 years or a colonoscopy every 10 years starting at age 71. Hepatitis C blood test. Hepatitis B blood test. Sexually transmitted disease (STD) testing. Diabetes screening. This is done by checking your blood sugar (glucose) after you have not eaten for a while (fasting). You may have this done every 1-3  years. Bone density scan. This is done to screen for osteoporosis. You may have this done starting at age 71. Mammogram. This may be done every 1-2 years. Talk to your health care provider about how often you should have regular mammograms. Talk with your health care provider about your test results, treatment options, and if necessary, the need for more tests. Vaccines  Your health care provider may recommend certain vaccines, such as: Influenza vaccine. This is recommended every year. Tetanus, diphtheria, and acellular pertussis (Tdap, Td) vaccine. You may need a Td booster every 10 years. Zoster vaccine. You may need this after age 19. Pneumococcal 13-valent conjugate (PCV13) vaccine. One dose is recommended after age 71. Pneumococcal polysaccharide (PPSV23) vaccine. One dose is recommended after age 71. Talk to your health care provider about which screenings and vaccines you need and how often you need them. This information is not intended to replace advice given to you by your health care provider. Make sure you discuss any questions you have with your health care provider. Document Released: 05/20/2015 Document Revised: 01/11/2016 Document Reviewed: 02/22/2015 Elsevier Interactive Patient Education  2017 [place] Prevention in the Home Falls can cause injuries. They can happen to people of all ages. There are many things you can do to make your home safe and to help prevent falls. What can I do on the outside of my home? Regularly fix the edges of walkways and driveways and fix any cracks. Remove anything that might make you trip as you walk through a door, such as a raised step or threshold. Trim any bushes or trees on the path to your home. Use bright outdoor lighting. Clear any walking paths of anything that might make someone trip, such as rocks or tools. Regularly check to see if handrails are loose or broken. Make sure that both sides of any steps have  handrails. Any raised decks and porches should have guardrails on the edges. Have any leaves, snow, or ice cleared regularly. Use sand or salt on walking paths during winter. Clean up any spills in your garage right away. This includes oil or grease spills. What can I do in the bathroom? Use night lights. Install grab bars by the toilet and in the tub and shower. Do not use towel bars as grab bars. Use non-skid mats or decals in the tub or shower. If you need to sit down in the shower, use a plastic, non-slip stool. Keep the floor dry. Clean up any water that spills on the floor as soon as it happens. Remove soap buildup in the tub or shower regularly. Attach bath mats securely with double-sided non-slip rug tape. Do not have throw rugs and other things on the floor that can make you trip. What can I do in the bedroom? Use night lights. Make sure that you have a light by your bed that is easy to reach. Do not use any sheets or blankets that  are too big for your bed. They should not hang down onto the floor. Have a firm chair that has side arms. You can use this for support while you get dressed. Do not have throw rugs and other things on the floor that can make you trip. What can I do in the kitchen? Clean up any spills right away. Avoid walking on wet floors. Keep items that you use a lot in easy-to-reach places. If you need to reach something above you, use a strong step stool that has a grab bar. Keep electrical cords out of the way. Do not use floor polish or wax that makes floors slippery. If you must use wax, use non-skid floor wax. Do not have throw rugs and other things on the floor that can make you trip. What can I do with my stairs? Do not leave any items on the stairs. Make sure that there are handrails on both sides of the stairs and use them. Fix handrails that are broken or loose. Make sure that handrails are as long as the stairways. Check any carpeting to make sure  that it is firmly attached to the stairs. Fix any carpet that is loose or worn. Avoid having throw rugs at the top or bottom of the stairs. If you do have throw rugs, attach them to the floor with carpet tape. Make sure that you have a light switch at the top of the stairs and the bottom of the stairs. If you do not have them, ask someone to add them for you. What else can I do to help prevent falls? Wear shoes that: Do not have high heels. Have rubber bottoms. Are comfortable and fit you well. Are closed at the toe. Do not wear sandals. If you use a stepladder: Make sure that it is fully opened. Do not climb a closed stepladder. Make sure that both sides of the stepladder are locked into place. Ask someone to hold it for you, if possible. Clearly mark and make sure that you can see: Any grab bars or handrails. First and last steps. Where the edge of each step is. Use tools that help you move around (mobility aids) if they are needed. These include: Canes. Walkers. Scooters. Crutches. Turn on the lights when you go into a dark area. Replace any light bulbs as soon as they burn out. Set up your furniture so you have a clear path. Avoid moving your furniture around. If any of your floors are uneven, fix them. If there are any pets around you, be aware of where they are. Review your medicines with your doctor. Some medicines can make you feel dizzy. This can increase your chance of falling. Ask your doctor what other things that you can do to help prevent falls. This information is not intended to replace advice given to you by your health care provider. Make sure you discuss any questions you have with your health care provider. Document Released: 02/17/2009 Document Revised: 09/29/2015 Document Reviewed: 05/28/2014 Elsevier Interactive Patient Education  2017 [employer].

## 2022-06-13 NOTE — Patient Instructions (Signed)
Hypertension, Adult Hypertension is another name for high blood pressure. High blood pressure forces your heart to work harder to pump blood. This can cause problems over time. There are two numbers in a blood pressure reading. There is a top number (systolic) over a bottom number (diastolic). It is best to have a blood pressure that is below 120/80. What are the causes? The cause of this condition is not known. Some other conditions can lead to high blood pressure. What increases the risk? Some lifestyle factors can make you more likely to develop high blood pressure: Smoking. Not getting enough exercise or physical activity. Being overweight. Having too much fat, sugar, calories, or salt (sodium) in your diet. Drinking too much alcohol. Other risk factors include: Having any of these conditions: Heart disease. Diabetes. High cholesterol. Kidney disease. Obstructive sleep apnea. Having a family history of high blood pressure and high cholesterol. Age. The risk increases with age. Stress. What are the signs or symptoms? High blood pressure may not cause symptoms. Very high blood pressure (hypertensive crisis) may cause: Headache. Fast or uneven heartbeats (palpitations). Shortness of breath. Nosebleed. Vomiting or feeling like you may vomit (nauseous). Changes in how you see. Very bad chest pain. Feeling dizzy. Seizures. How is this treated? This condition is treated by making healthy lifestyle changes, such as: Eating healthy foods. Exercising more. Drinking less alcohol. Your doctor may prescribe medicine if lifestyle changes do not help enough and if: Your top number is above 130. Your bottom number is above 80. Your personal target blood pressure may vary. Follow these instructions at home: Eating and drinking  If told, follow the DASH eating plan. To follow this plan: Fill one half of your plate at each meal with fruits and vegetables. Fill one fourth of your plate  at each meal with whole grains. Whole grains include whole-wheat pasta, brown rice, and whole-grain bread. Eat or drink low-fat dairy products, such as skim milk or low-fat yogurt. Fill one fourth of your plate at each meal with low-fat (lean) proteins. Low-fat proteins include fish, chicken without skin, eggs, beans, and tofu. Avoid fatty meat, cured and processed meat, or chicken with skin. Avoid pre-made or processed food. Limit the amount of salt in your diet to less than 1,500 mg each day. Do not drink alcohol if: Your doctor tells you not to drink. You are pregnant, may be pregnant, or are planning to become pregnant. If you drink alcohol: Limit how much you have to: 0-1 drink a day for women. 0-2 drinks a day for men. Know how much alcohol is in your drink. In the [place] one drink equals one 12 oz bottle of beer (355 mL), one 5 oz glass of wine (148 mL), or one 1 oz glass of hard liquor (44 mL). Lifestyle  Work with your doctor to stay at a healthy weight or to lose weight. Ask your doctor what the best weight is for you. Get at least 30 minutes of exercise that causes your heart to beat faster (aerobic exercise) most days of the week. This may include walking, swimming, or biking. Get at least 30 minutes of exercise that strengthens your muscles (resistance exercise) at least 3 days a week. This may include lifting weights or doing Pilates. Do not smoke or use any products that contain nicotine or tobacco. If you need help quitting, ask your doctor. Check your blood pressure at home as told by your doctor. Keep all follow-up visits. Medicines Take over-the-counter and prescription medicines  only as told by your doctor. Follow directions carefully. ?Do not skip doses of blood pressure medicine. The medicine does not work as well if you skip doses. Skipping doses also puts you at risk for problems. ?Ask your doctor about side effects or reactions to medicines that you should watch  for. ?Contact a doctor if: ?You think you are having a reaction to the medicine you are taking. ?You have headaches that keep coming back. ?You feel dizzy. ?You have swelling in your ankles. ?You have trouble with your vision. ?Get help right away if: ?You get a very bad headache. ?You start to feel mixed up (confused). ?You feel weak or numb. ?You feel faint. ?You have very bad pain in your: ?Chest. ?Belly (abdomen). ?You vomit more than once. ?You have trouble breathing. ?These symptoms may be an emergency. Get help right away. Call 911. ?Do not wait to see if the symptoms will go away. ?Do not drive yourself to the hospital. ?Summary ?Hypertension is another name for high blood pressure. ?High blood pressure forces your heart to work harder to pump blood. ?For most people, a normal blood pressure is less than 120/80. ?Making healthy choices can help lower blood pressure. If your blood pressure does not get lower with healthy choices, you may need to take medicine. ?This information is not intended to replace advice given to you by your health care provider. Make sure you discuss any questions you have with your health care provider. ?Document Revised: 02/09/2021 Document Reviewed: 02/09/2021 ?Elsevier Patient Education ? 2023 Elsevier Inc. ? ?

## 2022-06-13 NOTE — Progress Notes (Signed)
I,Victoria T Hamilton,acting as a scribe for [name], MD.,have documented all relevant documentation on the behalf of [name], MD,as directed by  [name], MD while in the presence of [name], MD.  .scr   Subjective:     Patient ID: Kelli Bond , female    DOB: 1950/05/26 , 72 y.o.   MRN: [id number]   Chief Complaint  Patient presents with   Hypertension   Diabetes    HPI  She is here today for diabetes, blood pressure, and thyroid f/u. She reports compliance with meds.  Denies dizziness, headache, chest pain, SOB. She has no specific questions or concerns at this time.    Diabetes She presents for her follow-up diabetic visit. She has type 2 diabetes mellitus. Her disease course has been stable. There are no hypoglycemic associated symptoms. Pertinent negatives for diabetes include no blurred vision, no chest pain, no polydipsia, no polyphagia and no polyuria. There are no hypoglycemic complications. Risk factors for coronary artery disease include diabetes mellitus, dyslipidemia, hypertension, obesity, sedentary lifestyle and post-menopausal. She participates in exercise intermittently. Eye exam is current.  Hypertension This is a chronic problem. The current episode started more than 1 year ago. The problem has been gradually improving since onset. The problem is controlled. Pertinent negatives include no blurred vision, chest pain, palpitations or shortness of breath. Risk factors for coronary artery disease include diabetes mellitus, dyslipidemia, post-menopausal state and sedentary lifestyle.     Past Medical History:  Diagnosis Date   Diabetes mellitus without complication ([place])    Essential hypertension, benign 12/28/2013   Hypertension    Pure hypercholesterolemia 12/28/2013   Thyroid disease      Family History  Problem Relation Age of Onset   Hypertension Father    Kidney disease Father    Bradycardia Mother        pacemaker    Breast cancer Sister    Kidney disease Brother      Current Outpatient Medications:    aspirin EC 81 MG tablet, Take 81 mg by mouth daily., Disp: , Rfl:    cetirizine (ZYRTEC) 10 MG tablet, Take 10 mg by mouth daily as needed for allergies. prn, Disp: , Rfl:    cholecalciferol (VITAMIN D) 1000 UNITS tablet, Take 1,000 Units by mouth daily. , Disp: , Rfl:    JANUVIA 100 MG tablet, TAKE 1 TABLET BY MOUTH EVERY DAY, Disp: 90 tablet, Rfl: 1   Lancets (ONETOUCH DELICA PLUS 123XX123) MISC, USE TO CHECK BLOOD SUGAR ONCE DAILY AS DIRECTED, Disp: 100 each, Rfl: 1   latanoprost (XALATAN) 0.005 % ophthalmic solution, Place 1 drop into both eyes at bedtime., Disp: , Rfl:    ONETOUCH ULTRA test strip, USE TO CHECK BLOOD SUGAR 1 time DAILY., Disp: 150 strip, Rfl: 1   rosuvastatin (CRESTOR) 20 MG tablet, TAKE 1 TABLET BY MOUTH EVERY DAY, Disp: 90 tablet, Rfl: 1   SYNTHROID 112 MCG tablet, TAKE 1 TABLET BY MOUTH monday THROUGH saturday and take 1/2 tablet ON sunday, Disp: 90 tablet, Rfl: 1   telmisartan-hydrochlorothiazide (MICARDIS HCT) 80-12.5 MG tablet, TAKE 1 TABLET BY MOUTH EVERY DAY, Disp: 90 tablet, Rfl: 1   fluticasone (FLONASE) 50 MCG/ACT nasal spray, USE 2 SPRAYS IN EACH NOSTRIL EVERY DAY, Disp: 16 g, Rfl: 2   Allergies  Allergen Reactions   Banana Other (See Comments)    Tingling in mouth.   Sulfa Antibiotics Rash     Review of Systems  Constitutional:  Negative.   Eyes:  Negative for blurred vision.  Respiratory: Negative.  Negative for shortness of breath.   Cardiovascular: Negative.  Negative for chest pain and palpitations.  Endocrine: Negative for polydipsia, polyphagia and polyuria.  Neurological: Negative.   Psychiatric/Behavioral: Negative.       Today's Vitals   06/13/22 1502 06/13/22 1520  BP: (!) 146/82 124/80  Pulse: 80   Temp: (!) 97.4 F (36.3 C)   SpO2: 98%   Weight: 191 lb 3.2 oz (86.7 kg)   Height: 5' 4"$  (1.626 m)    Body mass index is 32.82 kg/m.  Wt  Readings from Last 3 Encounters:  06/13/22 191 lb 3.2 oz (86.7 kg)  06/13/22 186 lb (84.4 kg)  03/15/22 189 lb (85.7 kg)    Objective:  Physical Exam Vitals and nursing note reviewed.  Constitutional:      Appearance: Normal appearance.  HENT:     Head: Normocephalic and atraumatic.     Nose:     Comments: Masked     Mouth/Throat:     Comments: Masked  Eyes:     Extraocular Movements: Extraocular movements intact.  Cardiovascular:     Rate and Rhythm: Normal rate and regular rhythm.     Pulses:          Dorsalis pedis pulses are 2+ on the right side and 2+ on the left side.     Heart sounds: Normal heart sounds.  Pulmonary:     Effort: Pulmonary effort is normal.     Breath sounds: Normal breath sounds.  Feet:     Right foot:     Protective Sensation: 5 sites tested.  5 sites sensed.     Skin integrity: Dry skin present.     Toenail Condition: Right toenails are long.     Left foot:     Protective Sensation: 5 sites tested.  5 sites sensed.     Skin integrity: Dry skin present.     Toenail Condition: Left toenails are long.  Skin:    General: Skin is warm.  Neurological:     General: No focal deficit present.     Mental Status: She is alert.  Psychiatric:        Mood and Affect: Mood normal.        Behavior: Behavior normal.      Assessment And Plan:     1. Hypertensive nephropathy Comments: Chronic, controlled. She will c/w telmisartan/hct 80/12.49m daily. Encouraged to follow low sodium diet. - Hemoglobin A1c - BMP8+EGFR - Amb Referral To Provider Referral Exercise Program (P.R.E.P)  2. Type 2 diabetes mellitus with stage 2 chronic kidney disease, without long-term current use of insulin (HCC) Comments: Chronic, I will check labs as below. Diabetic foot exam was performed. She will c/w Januvia 1048mdaily. Encouraged to aim for at least 150 minutes of exercise/week. - Hemoglobin A1c - BMP8+EGFR - Amb Referral To Provider Referral Exercise Program  (P.R.E.P)  3. Primary hypothyroidism Comments: Chronic, currently on Synthroid 11228maily, except 1/2 tab on Sundays. Last TSh therapeutic in Nov 2023.  4. Class 1 obesity due to excess calories with serious comorbidity and body mass index (BMI) of 32.0 to 32.9 in adult - Amb Referral To Provider Referral Exercise Program (P.R.E.P) She is encouraged to strive for BMI less than 30 to decrease cardiac risk. Advised to aim for at least 150 minutes of exercise per week.    Patient was given opportunity to ask questions. Patient verbalized understanding of  the plan and was able to repeat key elements of the plan. All questions were answered to their satisfaction.   I, [name], MD, have reviewed all documentation for this visit. The documentation on 06/13/22 for the exam, diagnosis, procedures, and orders are all accurate and complete.   IF YOU HAVE BEEN REFERRED TO A SPECIALIST, IT MAY TAKE 1-2 WEEKS TO SCHEDULE/PROCESS THE REFERRAL. IF YOU HAVE NOT HEARD FROM US/SPECIALIST IN TWO WEEKS, PLEASE GIVE [place] A CALL AT [phone] X [phone].   THE PATIENT IS ENCOURAGED TO PRACTICE SOCIAL DISTANCING DUE TO THE COVID-19 PANDEMIC.

## 2022-06-13 NOTE — Progress Notes (Signed)
I connected with Kelli Bond today by telephone and verified that I am speaking with the correct person using two identifiers. Location patient: home Location provider: work Persons participating in the virtual visit: Kelli Bond, [name] LPN.   I discussed the limitations, risks, security and privacy concerns of performing an evaluation and management service by telephone and the availability of in person appointments. I also discussed with the patient that there may be a patient responsible charge related to this service. The patient expressed understanding and verbally consented to this telephonic visit.    Interactive audio and video telecommunications were attempted between this provider and patient, however failed, due to patient having technical difficulties OR patient did not have access to video capability.  We continued and completed visit with audio only.     Vital signs may be patient reported or missing.  Subjective:   Kelli Bond is a 72 y.o. female who presents for Medicare Annual (Subsequent) preventive examination.  Review of Systems     Cardiac Risk Factors include: advanced age (>64men, >38 women);diabetes mellitus;hypertension;obesity (BMI >30kg/m2)     Objective:    Today's Vitals   06/13/22 0926  Weight: 186 lb (84.4 kg)  Height: 5\' 4"  (1.626 m)   Body mass index is 31.93 kg/m.     06/13/2022    9:29 AM 06/01/2021    2:49 PM 05/26/2020   12:21 PM 10/01/2019    3:14 PM 09/16/2018    3:41 PM 10/10/2017   10:55 AM  Advanced Directives  Does Patient Have a Medical Advance Directive? Yes No No No No No  Type of Paramedic of [place];Living will       Copy of [place] in Chart? No - copy requested       Would patient like information on creating a medical advance directive?  No - Patient declined No - Patient declined No - Patient declined      Current Medications (verified) Outpatient Encounter  Medications as of 06/13/2022  Medication Sig   aspirin EC 81 MG tablet Take 81 mg by mouth daily.   cetirizine (ZYRTEC) 10 MG tablet Take 10 mg by mouth daily as needed for allergies. prn   cholecalciferol (VITAMIN D) 1000 UNITS tablet Take 1,000 Units by mouth daily.    JANUVIA 100 MG tablet TAKE 1 TABLET BY MOUTH EVERY DAY   Lancets (ONETOUCH DELICA PLUS [id number]) MISC USE TO CHECK BLOOD SUGAR ONCE DAILY AS DIRECTED   latanoprost (XALATAN) 0.005 % ophthalmic solution Place 1 drop into both eyes at bedtime.   ONETOUCH ULTRA test strip USE TO CHECK BLOOD SUGAR 1 time DAILY.   rosuvastatin (CRESTOR) 20 MG tablet TAKE 1 TABLET BY MOUTH EVERY DAY   SYNTHROID 112 MCG tablet TAKE 1 TABLET BY MOUTH monday THROUGH saturday and take 1/2 tablet ON sunday   telmisartan-hydrochlorothiazide (MICARDIS HCT) 80-12.5 MG tablet TAKE 1 TABLET BY MOUTH EVERY DAY   docusate sodium (COLACE) 100 MG capsule Take by mouth. (Patient not taking: Reported on 06/13/2022)   fluticasone (FLONASE) 50 MCG/ACT nasal spray USE 2 SPRAYS IN EACH NOSTRIL EVERY DAY   predniSONE (DELTASONE) 10 MG tablet Take by mouth. (Patient not taking: Reported on 03/15/2022)   No facility-administered encounter medications on file as of 06/13/2022.    Allergies (verified) Banana and Sulfa antibiotics   History: Past Medical History:  Diagnosis Date   Diabetes mellitus without complication ([facility])    Essential hypertension, benign 12/28/2013  Hypertension    Pure hypercholesterolemia 12/28/2013   Thyroid disease    Past Surgical History:  Procedure Laterality Date   ABDOMINAL HYSTERECTOMY     KNEE SURGERY Left 04/04/2022   ORIF ANKLE FRACTURE Right 10/10/2017   Procedure: OPEN REDUCTION INTERNAL FIXATION (ORIF) ANKLE FRACTURE;  Surgeon: Hessie Knows, MD;  Location: [facility] ORS;  Service: Orthopedics;  Laterality: Right;   Family History  Problem Relation Age of Onset   Hypertension Father    Kidney disease Father    Bradycardia Mother         pacemaker   Breast cancer Sister    Kidney disease Brother    Social History   Socioeconomic History   Marital status: Married    Spouse name: Not on file   Number of children: Not on file   Years of education: Not on file   Highest education level: Not on file  Occupational History   Not on file  Tobacco Use   Smoking status: Never   Smokeless tobacco: Never  Vaping Use   Vaping Use: Never used  Substance and Sexual Activity   Alcohol use: Not Currently   Drug use: Never   Sexual activity: Yes  Other Topics Concern   Not on file  Social History Narrative   Not on file   Social Determinants of Health   Financial Resource Strain: Low Risk  (06/13/2022)   Overall Financial Resource Strain (CARDIA)    Difficulty of Paying Living Expenses: Not hard at all  Food Insecurity: No Food Insecurity (06/13/2022)   Hunger Vital Sign    Worried About Running Out of Food in the Last Year: Never true    [place] in the Last Year: Never true  Transportation Needs: No Transportation Needs (06/13/2022)   PRAPARE - Hydrologist (Medical): No    Lack of Transportation (Non-Medical): No  Physical Activity: Inactive (06/13/2022)   Exercise Vital Sign    Days of Exercise per Week: 0 days    Minutes of Exercise per Session: 0 min  Stress: No Stress Concern Present (06/13/2022)   [place]    Feeling of Stress : Only a little  Social Connections: Not on file    Tobacco Counseling Counseling given: Not Answered   Clinical Intake:  Pre-visit preparation completed: Yes  Pain : No/denies pain     Nutritional Status: BMI > 30  Obese Nutritional Risks: None Diabetes: Yes  How often do you need to have someone help you when you read instructions, pamphlets, or other written materials from your doctor or pharmacy?: 1 - Never  Diabetic? Yes Nutrition Risk Assessment:  Has the  patient had any N/V/D within the last 2 months?  No  Does the patient have any non-healing wounds?  No  Has the patient had any unintentional weight loss or weight gain?  No   Diabetes:  Is the patient diabetic?  Yes  If diabetic, was a CBG obtained today?  No  Did the patient bring in their glucometer from home?  No  How often do you monitor your CBG's? Once weekly.   Financial Strains and Diabetes Management:  Are you having any financial strains with the device, your supplies or your medication? No .  Does the patient want to be seen by Chronic Care Management for management of their diabetes?  No  Would the patient like to be referred to a Nutritionist or  for Diabetic Management?  No   Diabetic Exams:  Diabetic Eye Exam: Completed 03/19/2022 Diabetic Foot Exam: Overdue, Pt has been advised about the importance in completing this exam. Pt is scheduled for diabetic foot exam on next appointment.   Interpreter Needed?: No  Information entered by :: NAllen LPN   Activities of Daily Living    06/13/2022    9:30 AM  In your present state of health, do you have any difficulty performing the following activities:  Hearing? 1  Vision? 0  Difficulty concentrating or making decisions? 0  Walking or climbing stairs? 0  Dressing or bathing? 0  Doing errands, shopping? 0  Preparing Food and eating ? N  Using the Toilet? N  In the past six months, have you accidently leaked urine? N  Do you have problems with loss of bowel control? N  Managing your Medications? N  Managing your Finances? N  Housekeeping or managing your Housekeeping? N    Patient Care Team: Dorothyann Peng, MD as PCP - General (Internal Medicine)  Indicate any recent Medical Services you may have received from other than [facility] providers in the past year (date may be approximate).     Assessment:   This is a routine wellness examination for Kelli Bond.  Hearing/Vision screen Vision Screening - Comments:: Regular  eye exams, [facility]  Dietary issues and exercise activities discussed: Current Exercise Habits: The patient does not participate in regular exercise at present   Goals Addressed             This Visit's Progress    Patient Stated       06/13/2022, wants to lose weight       Depression Screen    06/13/2022    9:30 AM 06/01/2021    2:50 PM 05/26/2020   12:22 PM 01/20/2020    2:14 PM 10/01/2019    3:15 PM 04/15/2019   11:38 AM 12/11/2018    2:57 PM  PHQ 2/9 Scores  PHQ - 2 Score 0 0 0 0 0 0 0  PHQ- 9 Score     0      Fall Risk    06/13/2022    9:30 AM 06/01/2021    2:50 PM 06/01/2021   10:54 AM 05/26/2020   12:21 PM 10/01/2019    3:15 PM  Fall Risk   Falls in the past year? 0 0 0 0 0  Number falls in past yr: 0  0    Injury with Fall? 0  0    Risk for fall due to : Medication side effect Medication side effect  Medication side effect Medication side effect  Follow up Falls prevention discussed;Education provided;Falls evaluation completed Falls evaluation completed;Education provided;Falls prevention discussed  Falls evaluation completed;Education provided;Falls prevention discussed Falls evaluation completed;Education provided;Falls prevention discussed    FALL RISK PREVENTION PERTAINING TO THE HOME:  Any stairs in or around the home? Yes  If so, are there any without handrails? No  Home free of loose throw rugs in walkways, pet beds, electrical cords, etc? Yes  Adequate lighting in your home to reduce risk of falls? Yes   ASSISTIVE DEVICES UTILIZED TO PREVENT FALLS:  Life alert? No  Use of a cane, walker or w/c? No  Grab bars in the bathroom? No  Shower chair or bench in shower? Yes  Elevated toilet seat or a handicapped toilet? Yes   TIMED UP AND GO:  Was the test performed? No .  Cognitive Function:        06/13/2022    9:31 AM 06/01/2021    2:52 PM 05/26/2020   12:23 PM 10/01/2019    3:17 PM 09/16/2018    3:45 PM  6CIT Screen  What Year? 0 points  0 points 0 points 0 points 0 points  What month? 0 points 0 points 0 points 0 points 0 points  What time? 0 points 0 points 0 points 0 points 0 points  Count back from 20 0 points 0 points 0 points 0 points 0 points  Months in reverse 0 points 0 points 0 points 0 points 0 points  Repeat phrase 2 points 6 points 4 points 0 points 0 points  Total Score 2 points 6 points 4 points 0 points 0 points    Immunizations Immunization History  Administered Date(s) Administered   Fluad Quad(high Dose 65+) 01/20/2020, 02/27/2021   Influenza, High Dose Seasonal PF 02/04/2018   Influenza,inj,Quad PF,6+ Mos 03/14/2017   Influenza-Unspecified 02/10/2019, 01/30/2022   PFIZER Comirnaty(Gray Top)Covid-19 Tri-Sucrose Vaccine 08/15/2020   PFIZER(Purple Top)SARS-COV-2 Vaccination 05/09/2019, 05/29/2019, 02/19/2020   Pfizer Covid-19 Vaccine Bivalent Booster 13yrs & up 03/23/2021   Pneumococcal Conjugate-13 09/16/2018   Pneumococcal Polysaccharide-23 07/20/2014   Tdap 12/31/2018   Zoster Recombinat (Shingrix) 10/24/2021, 03/15/2022    TDAP status: Up to date  Flu Vaccine status: Up to date  Pneumococcal vaccine status: Up to date  Covid-19 vaccine status: Completed vaccines  Qualifies for Shingles Vaccine? Yes   Zostavax completed Yes   Shingrix Completed?: Yes  Screening Tests Health Maintenance  Topic Date Due   COVID-19 Vaccine (6 - 2023-24 season) 01/05/2022   FOOT EXAM  01/30/2022   Medicare Annual Wellness (AWV)  06/01/2022   MAMMOGRAM  06/19/2022   HEMOGLOBIN A1C  10/02/2022   COLONOSCOPY (Pts 45-12yrs Insurance coverage will need to be confirmed)  12/09/2022   Diabetic kidney evaluation - Urine ACR  03/16/2023   OPHTHALMOLOGY EXAM  03/20/2023   Diabetic kidney evaluation - eGFR measurement  04/04/2023   Pneumonia Vaccine 67+ Years old (3 - PPSV23 or PCV20) 09/16/2023   DTaP/Tdap/Td (2 - Td or Tdap) 12/30/2028   INFLUENZA VACCINE  Completed   DEXA SCAN  Completed   Hepatitis C  Screening  Completed   Zoster Vaccines- Shingrix  Completed   HPV VACCINES  Aged Out    Health Maintenance  Health Maintenance Due  Topic Date Due   COVID-19 Vaccine (6 - 2023-24 season) 01/05/2022   FOOT EXAM  01/30/2022   Medicare Annual Wellness (AWV)  06/01/2022    Colorectal cancer screening: Type of screening: Colonoscopy. Completed 12/08/2012. Repeat every 10 years  Mammogram status: Completed 06/19/2021. Repeat every year  Bone Density status: Completed 06/05/2018.   Lung Cancer Screening: (Low Dose CT Chest recommended if Age 56-80 years, 30 pack-year currently smoking OR have quit w/in 15years.) does not qualify.   Lung Cancer Screening Referral: no  Additional Screening:  Hepatitis C Screening: does qualify; Completed 10/18/2015  Vision Screening: Recommended annual ophthalmology exams for early detection of glaucoma and other disorders of the eye. Is the patient up to date with their annual eye exam?  Yes  Who is the provider or what is the name of the office in which the patient attends annual eye exams? [facility] If pt is not established with a provider, would they like to be referred to a provider to establish care? No .   Dental Screening: Recommended annual dental exams for  proper oral hygiene  Community Resource Referral / Chronic Care Management: CRR required this visit?  No   CCM required this visit?  No      Plan:     I have personally reviewed and noted the following in the patient's chart:   Medical and social history Use of alcohol, tobacco or illicit drugs  Current medications and supplements including opioid prescriptions. Patient is not currently taking opioid prescriptions. Functional ability and status Nutritional status Physical activity Advanced directives List of other physicians Hospitalizations, surgeries, and ER visits in previous 12 months Vitals Screenings to include cognitive, depression, and falls Referrals and  appointments  In addition, I have reviewed and discussed with patient certain preventive protocols, quality metrics, and best practice recommendations. A written personalized care plan for preventive services as well as general preventive health recommendations were provided to patient.     Kellie Simmering, LPN   11/08/9447   Nurse Notes: none  Due to this being a virtual visit, the after visit summary with patients personalized plan was offered to patient via mail or my-chart.  Patient would like to access on my-chart

## 2022-06-14 LAB — BMP8+EGFR
BUN/Creatinine Ratio: 10 — ABNORMAL LOW (ref 12–28)
BUN: 11 mg/dL (ref 8–27)
CO2: 25 mmol/L (ref 20–29)
Calcium: 9.9 mg/dL (ref 8.7–10.3)
Chloride: 97 mmol/L (ref 96–106)
Creatinine, Ser: 1.09 mg/dL — ABNORMAL HIGH (ref 0.57–1.00)
Glucose: 78 mg/dL (ref 70–99)
Potassium: 4.4 mmol/L (ref 3.5–5.2)
Sodium: 137 mmol/L (ref 134–144)
eGFR: 55 mL/min/{1.73_m2} — ABNORMAL LOW (ref >59)

## 2022-06-14 LAB — HEMOGLOBIN A1C
Est. average glucose Bld gHb Est-mCnc: 140 mg/dL
Hgb A1c MFr Bld: 6.5 % — ABNORMAL HIGH (ref 4.8–5.6)

## 2022-06-17 MED ORDER — FLUTICASONE PROPIONATE 50 MCG/ACT NA SUSP: NASAL | 2 refills | Status: DC

## 2022-06-25 LAB — HM MAMMOGRAPHY: HM Mammogram: NORMAL (ref 0–4)

## 2022-06-27 ENCOUNTER — Other Ambulatory Visit (HOSPITAL_BASED_OUTPATIENT_CLINIC_OR_DEPARTMENT_OTHER): Payer: Self-pay

## 2022-06-27 MED ORDER — COMIRNATY 30 MCG/0.3ML IM SUSY
PREFILLED_SYRINGE | INTRAMUSCULAR | 0 refills | Status: AC
  Filled 2022-06-27: qty 0.3, 1d supply, fill #0

## 2022-07-31 ENCOUNTER — Other Ambulatory Visit: Payer: Self-pay | Admitting: Internal Medicine

## 2022-07-31 DIAGNOSIS — E1122 Type 2 diabetes mellitus with diabetic chronic kidney disease: Secondary | ICD-10-CM

## 2022-09-06 ENCOUNTER — Telehealth: Payer: Self-pay | Admitting: *Deleted

## 2022-09-06 NOTE — Telephone Encounter (Signed)
I had contacted [name] on 08/28/2022 regarding PREP Class referral. She as not able to talk at that time and stated she would return my call, which she did today. She is interested in participating at the [facility]. We will call her back with future class availability at that site.

## 2022-09-07 ENCOUNTER — Telehealth: Payer: Self-pay

## 2022-09-07 NOTE — Telephone Encounter (Signed)
Called to discuss PREP program classes scheduled in June; left voicemail requesting call back

## 2022-10-09 ENCOUNTER — Emergency Department (HOSPITAL_COMMUNITY): Payer: Medicare Other

## 2022-10-09 ENCOUNTER — Other Ambulatory Visit: Payer: Self-pay

## 2022-10-09 ENCOUNTER — Emergency Department (HOSPITAL_COMMUNITY)
Admission: EM | Admit: 2022-10-09 | Discharge: 2022-10-09 | Disposition: A | Discharge status: Home / Self Care | Payer: Medicare Other | Attending: Student | Admitting: Student

## 2022-10-09 DIAGNOSIS — I129 Hypertensive chronic kidney disease with stage 1 through stage 4 chronic kidney disease, or unspecified chronic kidney disease: Secondary | ICD-10-CM | POA: Insufficient documentation

## 2022-10-09 DIAGNOSIS — Z7984 Long term (current) use of oral hypoglycemic drugs: Secondary | ICD-10-CM | POA: Diagnosis not present

## 2022-10-09 DIAGNOSIS — E039 Hypothyroidism, unspecified: Secondary | ICD-10-CM | POA: Insufficient documentation

## 2022-10-09 DIAGNOSIS — Z79899 Other long term (current) drug therapy: Secondary | ICD-10-CM | POA: Diagnosis not present

## 2022-10-09 DIAGNOSIS — Z7982 Long term (current) use of aspirin: Secondary | ICD-10-CM | POA: Insufficient documentation

## 2022-10-09 DIAGNOSIS — M542 Cervicalgia: Secondary | ICD-10-CM | POA: Diagnosis not present

## 2022-10-09 DIAGNOSIS — E1122 Type 2 diabetes mellitus with diabetic chronic kidney disease: Secondary | ICD-10-CM | POA: Diagnosis not present

## 2022-10-09 DIAGNOSIS — N182 Chronic kidney disease, stage 2 (mild): Secondary | ICD-10-CM | POA: Diagnosis not present

## 2022-10-09 DIAGNOSIS — R519 Headache, unspecified: Secondary | ICD-10-CM | POA: Diagnosis present

## 2022-10-09 MED ORDER — ACETAMINOPHEN 500 MG PO TABS
1000.0000 mg | ORAL_TABLET | Freq: Once | ORAL | Status: AC
  Administered 2022-10-09: 1000 mg via ORAL
  Filled 2022-10-09: qty 2

## 2022-10-09 MED ORDER — NAPROXEN 375 MG PO TABS: 375.0000 mg | ORAL_TABLET | Freq: Two times a day (BID) | ORAL | 0 refills | Status: DC
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
## 2022-10-12 ENCOUNTER — Telehealth: Payer: Self-pay

## 2022-10-12 NOTE — Telephone Encounter (Signed)
Called JX:BJYN program referral to offer 7/1 class at [facility]; left voicemail requesting return call.

## 2022-10-16 ENCOUNTER — Ambulatory Visit (INDEPENDENT_AMBULATORY_CARE_PROVIDER_SITE_OTHER): Payer: Medicare Other | Admitting: Internal Medicine

## 2022-10-16 ENCOUNTER — Encounter: Payer: Self-pay | Admitting: Internal Medicine

## 2022-10-16 VITALS — BP 136/82 | HR 52 | Temp 97.9°F | Ht 64.0 in | Wt 190.8 lb

## 2022-10-16 DIAGNOSIS — E039 Hypothyroidism, unspecified: Secondary | ICD-10-CM

## 2022-10-16 DIAGNOSIS — I129 Hypertensive chronic kidney disease with stage 1 through stage 4 chronic kidney disease, or unspecified chronic kidney disease: Secondary | ICD-10-CM | POA: Diagnosis not present

## 2022-10-16 DIAGNOSIS — E6609 Other obesity due to excess calories: Secondary | ICD-10-CM

## 2022-10-16 DIAGNOSIS — N182 Chronic kidney disease, stage 2 (mild): Secondary | ICD-10-CM | POA: Diagnosis not present

## 2022-10-16 DIAGNOSIS — E1122 Type 2 diabetes mellitus with diabetic chronic kidney disease: Secondary | ICD-10-CM | POA: Diagnosis not present

## 2022-10-16 DIAGNOSIS — M542 Cervicalgia: Secondary | ICD-10-CM

## 2022-10-16 DIAGNOSIS — Z6832 Body mass index (BMI) 32.0-32.9, adult: Secondary | ICD-10-CM

## 2022-10-16 NOTE — Patient Instructions (Signed)
Hypertension, Adult Hypertension is another name for high blood pressure. High blood pressure forces your heart to work harder to pump blood. This can cause problems over time. There are two numbers in a blood pressure reading. There is a top number (systolic) over a bottom number (diastolic). It is best to have a blood pressure that is below 120/80. What are the causes? The cause of this condition is not known. Some other conditions can lead to high blood pressure. What increases the risk? Some lifestyle factors can make you more likely to develop high blood pressure: Smoking. Not getting enough exercise or physical activity. Being overweight. Having too much fat, sugar, calories, or salt (sodium) in your diet. Drinking too much alcohol. Other risk factors include: Having any of these conditions: Heart disease. Diabetes. High cholesterol. Kidney disease. Obstructive sleep apnea. Having a family history of high blood pressure and high cholesterol. Age. The risk increases with age. Stress. What are the signs or symptoms? High blood pressure may not cause symptoms. Very high blood pressure (hypertensive crisis) may cause: Headache. Fast or uneven heartbeats (palpitations). Shortness of breath. Nosebleed. Vomiting or feeling like you may vomit (nauseous). Changes in how you see. Very bad chest pain. Feeling dizzy. Seizures. How is this treated? This condition is treated by making healthy lifestyle changes, such as: Eating healthy foods. Exercising more. Drinking less alcohol. Your doctor may prescribe medicine if lifestyle changes do not help enough and if: Your top number is above 130. Your bottom number is above 80. Your personal target blood pressure may vary. Follow these instructions at home: Eating and drinking  If told, follow the DASH eating plan. To follow this plan: Fill one half of your plate at each meal with fruits and vegetables. Fill one fourth of your plate  at each meal with whole grains. Whole grains include whole-wheat pasta, brown rice, and whole-grain bread. Eat or drink low-fat dairy products, such as skim milk or low-fat yogurt. Fill one fourth of your plate at each meal with low-fat (lean) proteins. Low-fat proteins include fish, chicken without skin, eggs, beans, and tofu. Avoid fatty meat, cured and processed meat, or chicken with skin. Avoid pre-made or processed food. Limit the amount of salt in your diet to less than 1,500 mg each day. Do not drink alcohol if: Your doctor tells you not to drink. You are pregnant, may be pregnant, or are planning to become pregnant. If you drink alcohol: Limit how much you have to: 0-1 drink a day for women. 0-2 drinks a day for men. Know how much alcohol is in your drink. In the [place] one drink equals one 12 oz bottle of beer (355 mL), one 5 oz glass of wine (148 mL), or one 1 oz glass of hard liquor (44 mL). Lifestyle  Work with your doctor to stay at a healthy weight or to lose weight. Ask your doctor what the best weight is for you. Get at least 30 minutes of exercise that causes your heart to beat faster (aerobic exercise) most days of the week. This may include walking, swimming, or biking. Get at least 30 minutes of exercise that strengthens your muscles (resistance exercise) at least 3 days a week. This may include lifting weights or doing Pilates. Do not smoke or use any products that contain nicotine or tobacco. If you need help quitting, ask your doctor. Check your blood pressure at home as told by your doctor. Keep all follow-up visits. Medicines Take over-the-counter and prescription medicines   only as told by your doctor. Follow directions carefully. Do not skip doses of blood pressure medicine. The medicine does not work as well if you skip doses. Skipping doses also puts you at risk for problems. Ask your doctor about side effects or reactions to medicines that you should watch  for. Contact a doctor if: You think you are having a reaction to the medicine you are taking. You have headaches that keep coming back. You feel dizzy. You have swelling in your ankles. You have trouble with your vision. Get help right away if: You get a very bad headache. You start to feel mixed up (confused). You feel weak or numb. You feel faint. You have very bad pain in your: Chest. Belly (abdomen). You vomit more than once. You have trouble breathing. These symptoms may be an emergency. Get help right away. Call 911. Do not wait to see if the symptoms will go away. Do not drive yourself to the hospital. Summary Hypertension is another name for high blood pressure. High blood pressure forces your heart to work harder to pump blood. For most people, a normal blood pressure is less than 120/80. Making healthy choices can help lower blood pressure. If your blood pressure does not get lower with healthy choices, you may need to take medicine. This information is not intended to replace advice given to you by your health care provider. Make sure you discuss any questions you have with your health care provider. Document Revised: 02/09/2021 Document Reviewed: 02/09/2021 Elsevier Patient Education  2024 Elsevier Inc.  
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
## 2022-10-17 LAB — CMP14+EGFR
ALT: 18 IU/L (ref 0–32)
AST: 25 IU/L (ref 0–40)
Albumin/Globulin Ratio: 1.9
Albumin: 4.5 g/dL (ref 3.8–4.8)
Alkaline Phosphatase: 82 IU/L (ref 44–121)
BUN/Creatinine Ratio: 13 (ref 12–28)
BUN: 14 mg/dL (ref 8–27)
Bilirubin Total: 0.4 mg/dL (ref 0.0–1.2)
CO2: 26 mmol/L (ref 20–29)
Calcium: 10 mg/dL (ref 8.7–10.3)
Chloride: 101 mmol/L (ref 96–106)
Creatinine, Ser: 1.12 mg/dL — ABNORMAL HIGH (ref 0.57–1.00)
Globulin, Total: 2.4 g/dL (ref 1.5–4.5)
Glucose: 83 mg/dL (ref 70–99)
Potassium: 4.5 mmol/L (ref 3.5–5.2)
Sodium: 139 mmol/L (ref 134–144)
Total Protein: 6.9 g/dL (ref 6.0–8.5)
eGFR: 53 mL/min/{1.73_m2} — ABNORMAL LOW (ref >59)

## 2022-10-17 LAB — TSH+FREE T4
Free T4: 1.63 ng/dL (ref 0.82–1.77)
TSH: 1.16 u[IU]/mL (ref 0.450–4.500)

## 2022-10-17 LAB — LIPID PANEL
Chol/HDL Ratio: 2.3 ratio (ref 0.0–4.4)
Cholesterol, Total: 123 mg/dL (ref 100–199)
HDL: 54 mg/dL (ref >39)
LDL Chol Calc (NIH): 54 mg/dL (ref 0–99)
Triglycerides: 77 mg/dL (ref 0–149)
VLDL Cholesterol Cal: 15 mg/dL (ref 5–40)

## 2022-10-17 LAB — HEMOGLOBIN A1C
Est. average glucose Bld gHb Est-mCnc: 134 mg/dL
Hgb A1c MFr Bld: 6.3 % — ABNORMAL HIGH (ref 4.8–5.6)

## 2022-11-13 ENCOUNTER — Other Ambulatory Visit: Payer: Self-pay | Admitting: Nurse Practitioner

## 2022-11-13 DIAGNOSIS — I129 Hypertensive chronic kidney disease with stage 1 through stage 4 chronic kidney disease, or unspecified chronic kidney disease: Secondary | ICD-10-CM

## 2022-11-30 NOTE — Telephone Encounter (Signed)
Error

## 2023-01-15 ENCOUNTER — Other Ambulatory Visit (HOSPITAL_BASED_OUTPATIENT_CLINIC_OR_DEPARTMENT_OTHER): Payer: Self-pay

## 2023-01-15 MED ORDER — COMIRNATY 30 MCG/0.3ML IM SUSY
0.3000 mL | PREFILLED_SYRINGE | INTRAMUSCULAR | 0 refills | Status: AC
  Filled 2023-01-15: qty 0.3, 1d supply, fill #0

## 2023-02-01 ENCOUNTER — Other Ambulatory Visit: Payer: Self-pay

## 2023-02-01 MED ORDER — SYNTHROID 112 MCG PO TABS: ORAL_TABLET | ORAL | 2 refills | Status: DC

## 2023-02-04 ENCOUNTER — Other Ambulatory Visit: Payer: Self-pay | Admitting: Internal Medicine

## 2023-02-04 DIAGNOSIS — N182 Chronic kidney disease, stage 2 (mild): Secondary | ICD-10-CM

## 2023-02-18 NOTE — Patient Instructions (Signed)

## 2023-02-18 NOTE — Progress Notes (Addendum)
Subjective:    Patient ID: Kelli Bond , female    DOB: 1950/12/20 , 72 y.o.   MRN: [id number]  Chief Complaint  Patient presents with   Annual Exam   Diabetes   Hypertension    HPI  Patient presents today for her HM. She no longer sees a GYN provider. She reports compliance with meds. Denies headaches, chest pain and shortness of breath. She is excited about her upcoming trip to [place]. She is leaving on Thursday.    Diabetes She presents for her follow-up diabetic visit. She has type 2 diabetes mellitus. Her disease course has been stable. There are no hypoglycemic associated symptoms. Pertinent negatives for diabetes include no blurred vision. There are no hypoglycemic complications. Diabetic complications include nephropathy. Risk factors for coronary artery disease include diabetes mellitus, dyslipidemia, hypertension, obesity, sedentary lifestyle and post-menopausal. Current diabetic treatment includes oral agent (monotherapy). She is compliant with treatment most of the time. She is following a diabetic diet. She never participates in exercise. An ACE inhibitor/angiotensin II receptor blocker is being taken. Eye exam is not current.  Hypertension This is a chronic problem. The current episode started more than 1 year ago. The problem has been gradually improving since onset. The problem is controlled. Pertinent negatives include no blurred vision. Risk factors for coronary artery disease include diabetes mellitus, dyslipidemia, post-menopausal state and sedentary lifestyle. The current treatment provides moderate improvement.     Past Medical History:  Diagnosis Date   Diabetes mellitus without complication (HCC)    Essential hypertension, benign 12/28/2013   Hypertension    Pure hypercholesterolemia 12/28/2013   Thyroid disease      Family History  Problem Relation Age of Onset   Hypertension Father    Kidney disease Father    Bradycardia Mother        pacemaker   Breast  cancer Sister    Kidney disease Brother      Current Outpatient Medications:    aspirin EC 81 MG tablet, Take 81 mg by mouth daily., Disp: , Rfl:    cetirizine (ZYRTEC) 10 MG tablet, Take 10 mg by mouth daily as needed for allergies. prn, Disp: , Rfl:    cholecalciferol (VITAMIN D) 1000 UNITS tablet, Take 1,000 Units by mouth daily. , Disp: , Rfl:    COVID-19 mRNA vaccine 2023-2024 (COMIRNATY) syringe, Inject into the muscle., Disp: 0.3 mL, Rfl: 0   COVID-19 mRNA vaccine 2024-2025 (COMIRNATY) syringe, Inject 0.3 mLs into the muscle., Disp: 0.3 mL, Rfl: 0   JANUVIA 100 MG tablet, TAKE 1 TABLET BY MOUTH EVERY DAY, Disp: 90 tablet, Rfl: 1   Lancets (ONETOUCH DELICA PLUS LANCET33G) MISC, USE TO CHECK BLOOD SUGAR ONCE DAILY AS DIRECTED, Disp: 100 each, Rfl: 1   latanoprost (XALATAN) 0.005 % ophthalmic solution, Place 1 drop into both eyes at bedtime., Disp: , Rfl:    ONETOUCH ULTRA test strip, USE TO CHECK BLOOD SUGAR 1 time DAILY., Disp: 150 strip, Rfl: 1   rosuvastatin (CRESTOR) 20 MG tablet, TAKE 1 TABLET BY MOUTH EVERY DAY, Disp: 90 tablet, Rfl: 1   SYNTHROID 112 MCG tablet, TAKE 1 TABLET BY MOUTH monday THROUGH saturday and take 1/2 tablet ON sunday, Disp: 90 tablet, Rfl: 2   telmisartan-hydrochlorothiazide (MICARDIS HCT) 80-12.5 MG tablet, TAKE 1 TABLET BY MOUTH EVERY DAY, Disp: 90 tablet, Rfl: 1   fluticasone (FLONASE) 50 MCG/ACT nasal spray, Use 2 sprays each nostril daily prn (Patient not taking: Reported on 02/19/2023), Disp: 16 g, Rfl:  2   naproxen (NAPROSYN) 375 MG tablet, Take 1 tablet (375 mg total) by mouth 2 (two) times daily. (Patient not taking: Reported on 10/16/2022), Disp: 20 tablet, Rfl: 0   valACYclovir (VALTREX) 500 MG tablet, Take 1 tablet (500 mg total) by mouth 2 (two) times daily. Take one tablet twice daily x 1 week at first symptom of fever blister, Disp: 28 tablet, Rfl: 2   Allergies  Allergen Reactions   Banana Other (See Comments)    Tingling in mouth.   Sulfa  Antibiotics Rash      The patient states she uses status post hysterectomy for birth control. No LMP recorded. Patient has had a hysterectomy.. Negative for Dysmenorrhea. Negative for: breast discharge, breast lump(s), breast pain and breast self exam. Associated symptoms include abnormal vaginal bleeding. Pertinent negatives include abnormal bleeding (hematology), anxiety, decreased libido, depression, difficulty falling sleep, dyspareunia, history of infertility, nocturia, sexual dysfunction, sleep disturbances, urinary incontinence, urinary urgency, vaginal discharge and vaginal itching. Diet regular.The patient states her exercise level is  minimal.  . The patient's tobacco use is:  Social History   Tobacco Use  Smoking Status Never  Smokeless Tobacco Never  . She has been exposed to passive smoke. The patient's alcohol use is:  Social History   Substance and Sexual Activity  Alcohol Use Not Currently    Review of Systems  Constitutional: Negative.   HENT: Negative.    Eyes: Negative.  Negative for blurred vision.  Respiratory: Negative.    Cardiovascular: Negative.   Gastrointestinal: Negative.   Endocrine: Negative.   Genitourinary: Negative.   Musculoskeletal: Negative.   Skin: Negative.   Allergic/Immunologic: Negative.   Neurological: Negative.   Hematological: Negative.   Psychiatric/Behavioral: Negative.       Today's Vitals   02/19/23 1207  BP: 130/84  Pulse: 77  Temp: 97.8 F (36.6 C)  SpO2: 98%  Weight: 190 lb 9.6 oz (86.5 kg)  Height: 5\' 4"  (1.626 m)   Body mass index is 32.72 kg/m.  Wt Readings from Last 3 Encounters:  02/19/23 190 lb 9.6 oz (86.5 kg)  10/16/22 190 lb 12.8 oz (86.5 kg)  10/09/22 190 lb (86.2 kg)     Objective:  Physical Exam Vitals and nursing note reviewed.  Constitutional:      Appearance: Normal appearance.  HENT:     Head: Normocephalic and atraumatic.     Right Ear: Tympanic membrane, ear canal and external ear normal.      Left Ear: Tympanic membrane, ear canal and external ear normal.     Nose: Nose normal.     Mouth/Throat:     Mouth: Mucous membranes are moist.     Pharynx: Oropharynx is clear.  Eyes:     Extraocular Movements: Extraocular movements intact.     Conjunctiva/sclera: Conjunctivae normal.     Pupils: Pupils are equal, round, and reactive to light.  Cardiovascular:     Rate and Rhythm: Normal rate and regular rhythm.     Pulses: Normal pulses.          Dorsalis pedis pulses are 2+ on the right side and 2+ on the left side.     Heart sounds: Normal heart sounds.  Pulmonary:     Effort: Pulmonary effort is normal.     Breath sounds: Normal breath sounds.  Chest:  Breasts:    Tanner Score is 5.     Right: Normal.     Left: Normal.  Abdominal:     General:  Bowel sounds are normal.     Palpations: Abdomen is soft.  Genitourinary:    Comments: deferred Musculoskeletal:        General: Normal range of motion.     Cervical back: Normal range of motion and neck supple.  Feet:     Right foot:     Protective Sensation: 5 sites tested.  5 sites sensed.     Skin integrity: Dry skin present.     Toenail Condition: Right toenails are normal.     Left foot:     Protective Sensation: 5 sites tested.  5 sites sensed.     Skin integrity: Dry skin present.     Toenail Condition: Left toenails are normal.  Skin:    General: Skin is warm and dry.  Neurological:     General: No focal deficit present.     Mental Status: She is alert and oriented to person, place, and time.  Psychiatric:        Mood and Affect: Mood normal.        Behavior: Behavior normal.         Assessment And Plan:     Encounter for general adult medical examination w/o abnormal findings Assessment & Plan: A full exam was performed.  Importance of monthly self breast exams was discussed with the patient.  She is advised to get 30-45 minutes of regular exercise, no less than four to five days per week. Both  weight-bearing and aerobic exercises are recommended.  She is advised to follow a healthy diet with at least six fruits/veggies per day, decrease intake of red meat and other saturated fats and to increase fish intake to twice weekly.  Meats/fish should not be fried -- baked, boiled or broiled is preferable. It is also important to cut back on your sugar intake.  Be sure to read labels - try to avoid anything with added sugar, high fructose corn syrup or other sweeteners.  If you must use a sweetener, you can try stevia or monkfruit.  It is also important to avoid artificially sweetened foods/beverages and diet drinks. Lastly, wear SPF 50 sunscreen on exposed skin and when in direct sunlight for an extended period of time.  Be sure to avoid fast food restaurants and aim for at least 60 ounces of water daily.       Type 2 diabetes mellitus with stage 3a chronic kidney disease, without long-term current use of insulin (HCC) Assessment & Plan: Chronic, diabetic foot exam was performed.  She will continue with Januvia 100mg  daily. I would like to consider SGLT2i therapy; however, I do not want to start a new medication prior to her trip. Will discuss further in the near future.  I DISCUSSED WITH THE PATIENT AT LENGTH REGARDING THE GOALS OF GLYCEMIC CONTROL AND POSSIBLE LONG-TERM COMPLICATIONS.  I  ALSO STRESSED THE IMPORTANCE OF COMPLIANCE WITH HOME GLUCOSE MONITORING, DIETARY RESTRICTIONS INCLUDING AVOIDANCE OF SUGARY DRINKS/PROCESSED FOODS,  ALONG WITH REGULAR EXERCISE.  I  ALSO STRESSED THE IMPORTANCE OF ANNUAL EYE EXAMS, SELF FOOT CARE AND COMPLIANCE WITH OFFICE VISITS.   Orders: -     CBC -     CMP14+EGFR -     Hemoglobin A1c  Hypertensive nephropathy Assessment & Plan: Chronic, fair control. EKG performed, SB w/o acute changes. She is aware that goal BP is less than 120/80. She is encouraged to follow low sodium diet and to incorporate more exercise into her daily routine. She will continue with  telmisartan 80/12.5mg   daily for now. May consider increasing HCT dose in the future OR adding low dose amlodipine in the evenings. She will f/u in four months for re-evaluation.   Orders: -     POCT urinalysis dipstick -     Microalbumin / creatinine urine ratio -     EKG 12-Lead  Primary hypothyroidism Assessment & Plan: Chronic, she is currently taking Synthroid daily M-Sat and 1/2 tab on Sundays. I will check thyroid panel and adjust meds as needed.    Class 1 obesity due to excess calories with serious comorbidity and body mass index (BMI) of 32.0 to 32.9 in adult Assessment & Plan: She is encouraged to strive for BMI less than 30 to decrease cardiac risk. Advised to aim for at least 150 minutes of exercise per week.    Screening for colon cancer -     Ambulatory referral to Gastroenterology  She is encouraged to strive for BMI less than 30 to decrease cardiac risk. Advised to aim for at least 150 minutes of exercise per week.    Return for 1 YEAR HM, 4 month DM. Patient was given opportunity to ask questions. Patient verbalized understanding of the plan and was able to repeat key elements of the plan. All questions were answered to their satisfaction.   ADDENDUM: Correct CKD staging was submitted. (Changed to N18.31)   I, Gwynneth Aliment, MD, have reviewed all documentation for this visit. The documentation on 02/19/23 for the exam, diagnosis, procedures, and orders are all accurate and complete.

## 2023-02-19 ENCOUNTER — Encounter: Payer: Self-pay | Admitting: Internal Medicine

## 2023-02-19 ENCOUNTER — Ambulatory Visit (INDEPENDENT_AMBULATORY_CARE_PROVIDER_SITE_OTHER): Payer: Medicare Other | Admitting: Internal Medicine

## 2023-02-19 VITALS — BP 130/84 | HR 77 | Temp 97.8°F | Ht 64.0 in | Wt 190.6 lb

## 2023-02-19 DIAGNOSIS — N182 Chronic kidney disease, stage 2 (mild): Secondary | ICD-10-CM

## 2023-02-19 DIAGNOSIS — M542 Cervicalgia: Secondary | ICD-10-CM

## 2023-02-19 DIAGNOSIS — E1122 Type 2 diabetes mellitus with diabetic chronic kidney disease: Secondary | ICD-10-CM | POA: Diagnosis not present

## 2023-02-19 DIAGNOSIS — E6609 Other obesity due to excess calories: Secondary | ICD-10-CM

## 2023-02-19 DIAGNOSIS — E66811 Obesity, class 1: Secondary | ICD-10-CM

## 2023-02-19 DIAGNOSIS — I129 Hypertensive chronic kidney disease with stage 1 through stage 4 chronic kidney disease, or unspecified chronic kidney disease: Secondary | ICD-10-CM | POA: Diagnosis not present

## 2023-02-19 DIAGNOSIS — Z6832 Body mass index (BMI) 32.0-32.9, adult: Secondary | ICD-10-CM

## 2023-02-19 DIAGNOSIS — N1831 Chronic kidney disease, stage 3a: Secondary | ICD-10-CM

## 2023-02-19 DIAGNOSIS — E039 Hypothyroidism, unspecified: Secondary | ICD-10-CM

## 2023-02-19 DIAGNOSIS — Z0000 Encounter for general adult medical examination without abnormal findings: Secondary | ICD-10-CM | POA: Insufficient documentation

## 2023-02-19 DIAGNOSIS — Z1211 Encounter for screening for malignant neoplasm of colon: Secondary | ICD-10-CM

## 2023-02-19 LAB — POCT URINALYSIS DIPSTICK
Bilirubin, UA: NEGATIVE
Blood, UA: NEGATIVE
Glucose, UA: NEGATIVE
Ketones, UA: NEGATIVE
Leukocytes, UA: NEGATIVE
Nitrite, UA: NEGATIVE
Protein, UA: NEGATIVE
Spec Grav, UA: 1.015 (ref 1.010–1.025)
Urobilinogen, UA: 0.2 U/dL
pH, UA: 5.5 (ref 5.0–8.0)

## 2023-02-19 NOTE — Assessment & Plan Note (Signed)

## 2023-02-19 NOTE — Assessment & Plan Note (Signed)
She is encouraged to strive for BMI less than 30 to decrease cardiac risk. Advised to aim for at least 150 minutes of exercise per week.

## 2023-02-19 NOTE — Assessment & Plan Note (Signed)
Chronic, fair control. EKG performed, SB w/o acute changes. She is aware that goal BP is less than 120/80. She is encouraged to follow low sodium diet and to incorporate more exercise into her daily routine. She will continue with telmisartan 80/12.5mg  daily for now. May consider increasing HCT dose in the future OR adding low dose amlodipine in the evenings. She will f/u in four months for re-evaluation.

## 2023-02-19 NOTE — Assessment & Plan Note (Signed)
Chronic, she is currently taking Synthroid daily M-Sat and 1/2 tab on Sundays. I will check thyroid panel and adjust meds as needed.

## 2023-02-19 NOTE — Assessment & Plan Note (Addendum)
Chronic, diabetic foot exam was performed.  She will continue with Januvia 100mg  daily. I would like to consider SGLT2i therapy; however, I do not want to start a new medication prior to her trip. Will discuss further in the near future.  I DISCUSSED WITH THE PATIENT AT LENGTH REGARDING THE GOALS OF GLYCEMIC CONTROL AND POSSIBLE LONG-TERM COMPLICATIONS.  I  ALSO STRESSED THE IMPORTANCE OF COMPLIANCE WITH HOME GLUCOSE MONITORING, DIETARY RESTRICTIONS INCLUDING AVOIDANCE OF SUGARY DRINKS/PROCESSED FOODS,  ALONG WITH REGULAR EXERCISE.  I  ALSO STRESSED THE IMPORTANCE OF ANNUAL EYE EXAMS, SELF FOOT CARE AND COMPLIANCE WITH OFFICE VISITS.

## 2023-02-20 LAB — CBC
Hematocrit: 42.2 % (ref 34.0–46.6)
Hemoglobin: 13.7 g/dL (ref 11.1–15.9)
MCH: 26.7 pg (ref 26.6–33.0)
MCHC: 32.5 g/dL (ref 31.5–35.7)
MCV: 82 fL (ref 79–97)
Platelets: 241 10*3/uL (ref 150–450)
RBC: 5.14 x10E6/uL (ref 3.77–5.28)
RDW: 14.3 % (ref 11.7–15.4)
WBC: 6.1 10*3/uL (ref 3.4–10.8)

## 2023-02-20 LAB — CMP14+EGFR
ALT: 20 [IU]/L (ref 0–32)
AST: 23 [IU]/L (ref 0–40)
Albumin: 4.4 g/dL (ref 3.8–4.8)
Alkaline Phosphatase: 84 [IU]/L (ref 44–121)
BUN/Creatinine Ratio: 12 (ref 12–28)
BUN: 15 mg/dL (ref 8–27)
Bilirubin Total: 0.4 mg/dL (ref 0.0–1.2)
CO2: 26 mmol/L (ref 20–29)
Calcium: 9.8 mg/dL (ref 8.7–10.3)
Chloride: 99 mmol/L (ref 96–106)
Creatinine, Ser: 1.22 mg/dL — ABNORMAL HIGH (ref 0.57–1.00)
Globulin, Total: 2.4 g/dL (ref 1.5–4.5)
Glucose: 81 mg/dL (ref 70–99)
Potassium: 4.5 mmol/L (ref 3.5–5.2)
Sodium: 139 mmol/L (ref 134–144)
Total Protein: 6.8 g/dL (ref 6.0–8.5)
eGFR: 47 mL/min/{1.73_m2} — ABNORMAL LOW (ref >59)

## 2023-02-20 LAB — MICROALBUMIN / CREATININE URINE RATIO
Creatinine, Urine: 40.3 mg/dL
Microalb/Creat Ratio: <7 mg/g{creat} (ref 0–29)
Microalbumin, Urine: <3 ug/mL

## 2023-02-20 LAB — HEMOGLOBIN A1C
Est. average glucose Bld gHb Est-mCnc: 140 mg/dL
Hgb A1c MFr Bld: 6.5 % — ABNORMAL HIGH (ref 4.8–5.6)

## 2023-02-21 ENCOUNTER — Encounter: Payer: Medicare Other | Admitting: Internal Medicine

## 2023-03-12 ENCOUNTER — Encounter: Payer: Self-pay | Admitting: Gastroenterology

## 2023-03-28 ENCOUNTER — Ambulatory Visit: Payer: Medicare Other | Admitting: Dermatology

## 2023-03-28 VITALS — BP 134/80

## 2023-03-28 DIAGNOSIS — A6009 Herpesviral infection of other urogenital tract: Secondary | ICD-10-CM | POA: Diagnosis not present

## 2023-03-28 DIAGNOSIS — B009 Herpesviral infection, unspecified: Secondary | ICD-10-CM

## 2023-03-28 DIAGNOSIS — L819 Disorder of pigmentation, unspecified: Secondary | ICD-10-CM | POA: Diagnosis not present

## 2023-03-28 DIAGNOSIS — B001 Herpesviral vesicular dermatitis: Secondary | ICD-10-CM

## 2023-03-28 DIAGNOSIS — L908 Other atrophic disorders of skin: Secondary | ICD-10-CM | POA: Diagnosis not present

## 2023-03-28 MED ORDER — VALACYCLOVIR HCL 500 MG PO TABS: 500.0000 mg | ORAL_TABLET | Freq: Two times a day (BID) | ORAL | 2 refills | Status: AC

## 2023-03-28 NOTE — Progress Notes (Signed)
   New Patient Visit   Subjective  Kelli Bond is a 72 y.o. female who presents for the following: She had a pimple on her lower lip. She used a hot compress and it drained. It looks like it might be coming back now. She gets similar bumps in the vagina area and when she is really stressed they turn into boils. She does not have any today.    The following portions of the chart were reviewed this encounter and updated as appropriate: medications, allergies, medical history  Review of Systems:  No other skin or systemic complaints except as noted in HPI or Assessment and Plan.  Objective  Well appearing patient in no apparent distress; mood and affect are within normal limits.   A focused examination was performed of the following areas:   Relevant exam findings are noted in the Assessment and Plan.    Assessment & Plan   1. Recurrent Cold Sores (Herpes Labialis) - Assessment: Patient presents with a history of recurrent cold sores on the lip, noting a recent flare-up that has shown improvement but may be recurring. The patient has previously self-managed without oral medication. - Plan: Prescribe Valacyclovir 500 mg, one tablet to be taken twice daily for one week at the onset of a flare-up. Dispense 28 tablets with two refills. Schedule follow-up appointments every 6 to 8 months to monitor the patient's condition.  2. Genital Herpes (Herpes Genitalis) - Assessment: Patient reports occasional outbreaks of genital herpes, triggered by stress or feeling out of sync. - Plan: Advise the patient to use the prescribed Valacyclovir for genital herpes flare-ups, similarly to the management of cold sores. Monitor the patient's condition during scheduled follow-up appointments.  3. Periorbital Hyperpigmentation and Wrinkles (Crow's Feet) - Assessment: Patient inquires about treatment options for darkening and wrinkles around the eyes. - Plan: Recommend the use of ROC Retinol Eye Cream for  the treatment of hyperpigmentation and wrinkles. Include a picture of the product in the after-visit summary. Encourage the patient to apply the cream nightly and monitor for improvement.    No follow-ups on file.  I, Joanie Coddington, CMA, am acting as scribe for [employer], DO .   Documentation: I have reviewed the above documentation for accuracy and completeness, and I agree with the above.  Langston Reusing, DO

## 2023-03-28 NOTE — Patient Instructions (Addendum)
Hello Ms. Nickie ,  Thank you for visiting [place] today. We are committed to supporting you on your journey to better health and appreciate your proactive approach to addressing your health concerns.  Here is a summary of the key instructions from today's consultation:  - Medication Prescribed: Valacyclovir 500 mg   - Dosage: Take one tablet twice a day for one week.   - Instructions: Begin taking the medication at the first sign of a cold sore to effectively manage symptoms.   - Supply: You have been provided with 28 tablets, enough for two flare-ups, with two refills available.  - Follow-Up: We recommend scheduling a follow-up appointment every 6 to 8 months to monitor your condition.  - Additional Recommendation: For the hyperpigmentation around your eyes (crow's feet), we suggest using ROC Retinol Eye Cream nightly. You will find a picture and further details in your after-visit summary.  Your prescription has been sent to your pharmacy, and it is important to start the medication now to address the early signs of a potential flare-up. Should you have any further questions or require additional support, please do not hesitate to reach out to our office.  Wishing you a healthy and joyful holiday season.  Warm regards,  Dr. [facility]     Important Information  Due to recent changes in healthcare laws, you may see results of your pathology and/or laboratory studies on MyChart before the doctors have had a chance to review them. We understand that in some cases there may be results that are confusing or concerning to you. Please understand that not all results are received at the same time and often the doctors may need to interpret multiple results in order to provide you with the best plan of care or course of treatment. Therefore, we ask that you please give [place] 2 business days to thoroughly review all your results before contacting the office for clarification. Should we  see a critical lab result, you will be contacted sooner.   If You Need Anything After Your Visit  If you have any questions or concerns for your doctor, please call our main line at [phone] If no one answers, please leave a voicemail as directed and we will return your call as soon as possible. Messages left after 4 pm will be answered the following business day.   You may also send [place] a message via MyChart. We typically respond to MyChart messages within 1-2 business days.  For prescription refills, please ask your pharmacy to contact our office. Our fax number is [phone].  If you have an urgent issue when the clinic is closed that cannot wait until the next business day, you can page your doctor at the number below.    Please note that while we do our best to be available for urgent issues outside of office hours, we are not available 24/7.   If you have an urgent issue and are unable to reach [place], you may choose to seek medical care at your doctor's office, retail clinic, urgent care center, or emergency room.  If you have a medical emergency, please immediately call 911 or go to the emergency department. In the event of inclement weather, please call our main line at [phone] for an update on the status of any delays or closures.  Dermatology Medication Tips: Please keep the boxes that topical medications come in in order to help keep track of the instructions about where and how to use these. Pharmacies  typically print the medication instructions only on the boxes and not directly on the medication tubes.   If your medication is too expensive, please contact our office at [phone] or send [place] a message through MyChart.   We are unable to tell what your co-pay for medications will be in advance as this is different depending on your insurance coverage. However, we may be able to find a substitute medication at lower cost or fill out paperwork to get insurance to cover a  needed medication.   If a prior authorization is required to get your medication covered by your insurance company, please allow [place] 1-2 business days to complete this process.  Drug prices often vary depending on where the prescription is filled and some pharmacies may offer cheaper prices.  The website www.goodrx.com contains coupons for medications through different pharmacies. The prices here do not account for what the cost may be with help from insurance (it may be cheaper with your insurance), but the website can give you the price if you did not use any insurance.  - You can print the associated coupon and take it with your prescription to the pharmacy.  - You may also stop by our office during regular business hours and pick up a GoodRx coupon card.  - If you need your prescription sent electronically to a different pharmacy, notify our office through [facility] or by phone at [phone]

## 2023-04-01 ENCOUNTER — Encounter: Payer: Self-pay | Admitting: Dermatology

## 2023-04-13 IMAGING — US US RENAL
1 series · 14 of 25 positions shown · non-contrast
Comparison: None.

CLINICAL DATA: Microhematuria.

EXAM:
RENAL / URINARY TRACT ULTRASOUND COMPLETE

[Series 1: us renal · 14 of 31 slices shown]
[im 1/31]
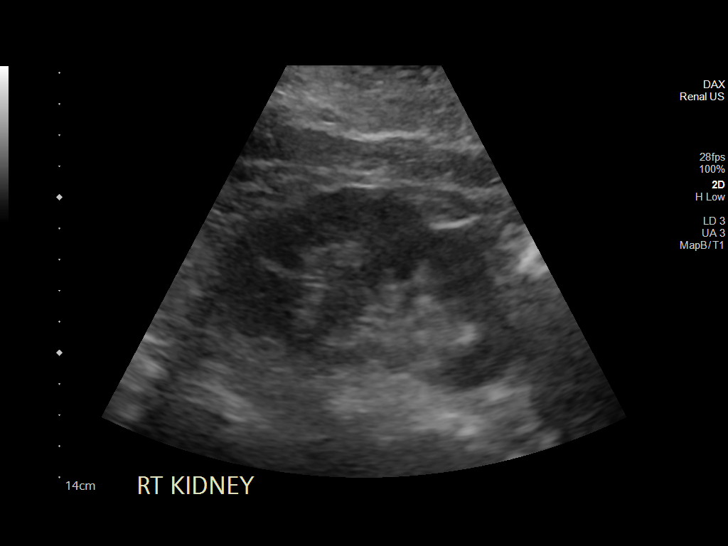
[im 3/31]
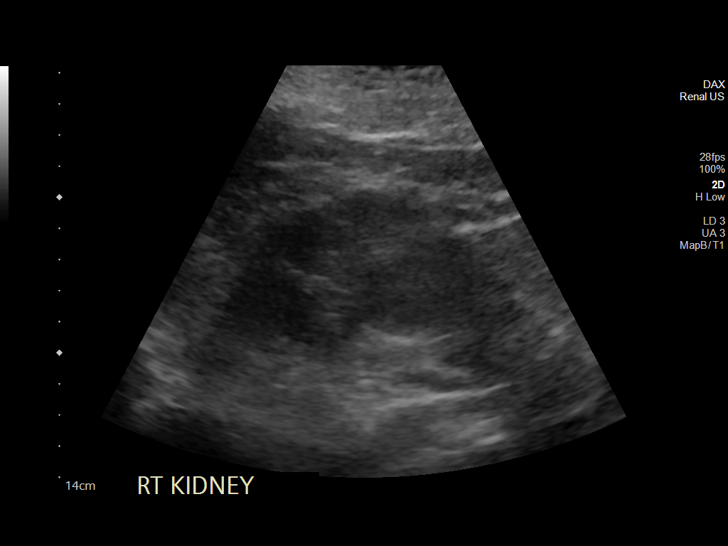
[im 6/31]
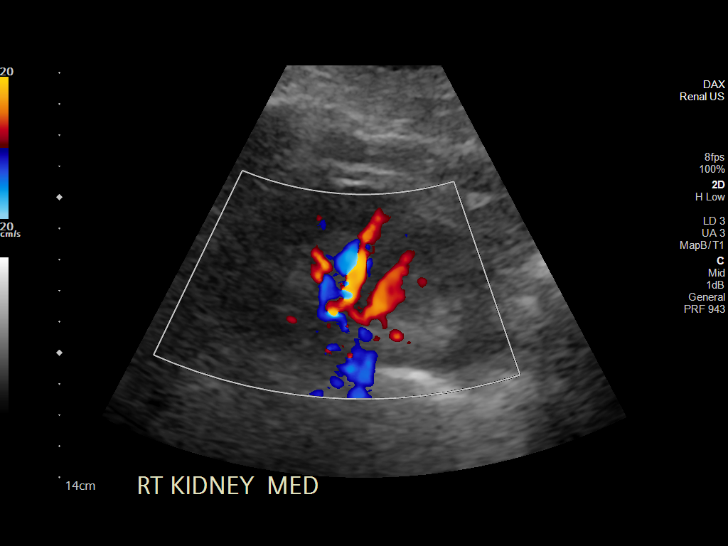
[im 8/31]
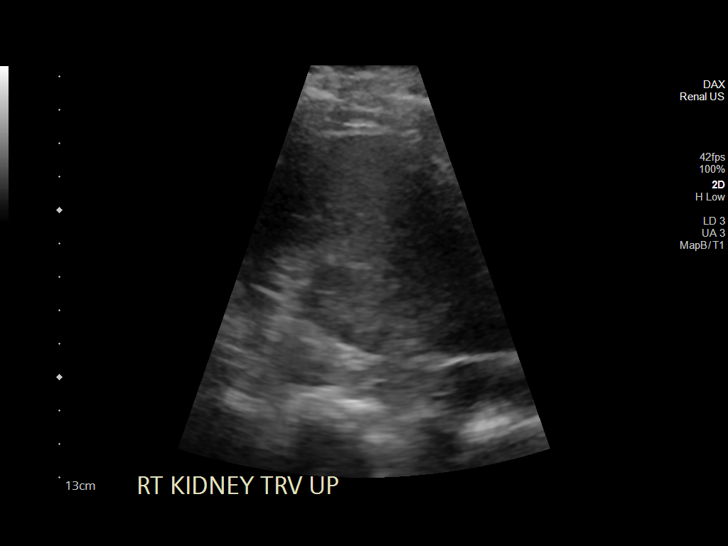
[im 11/31]
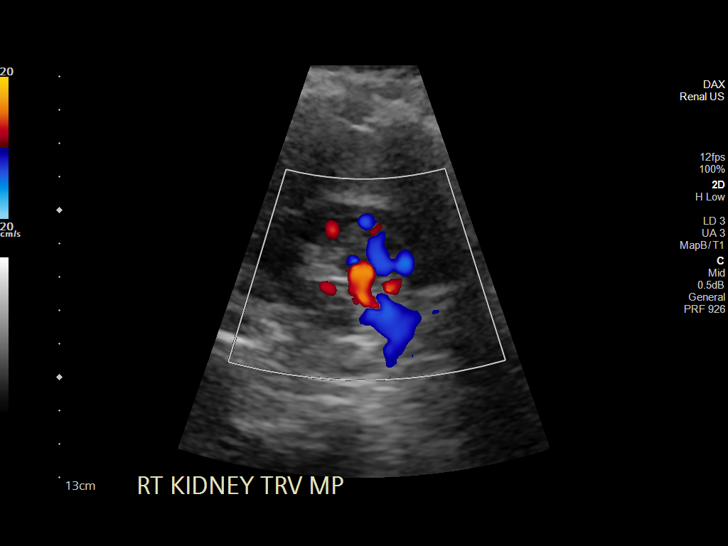
[im 12/31]
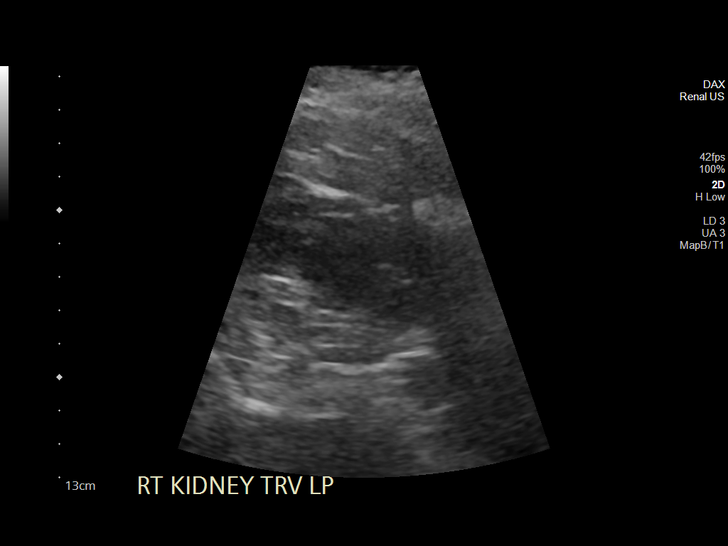
[im 14/31]
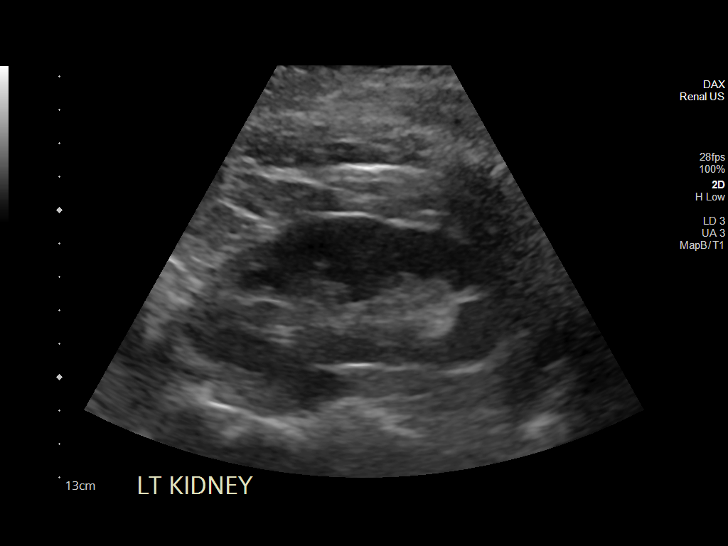
[im 17/31]
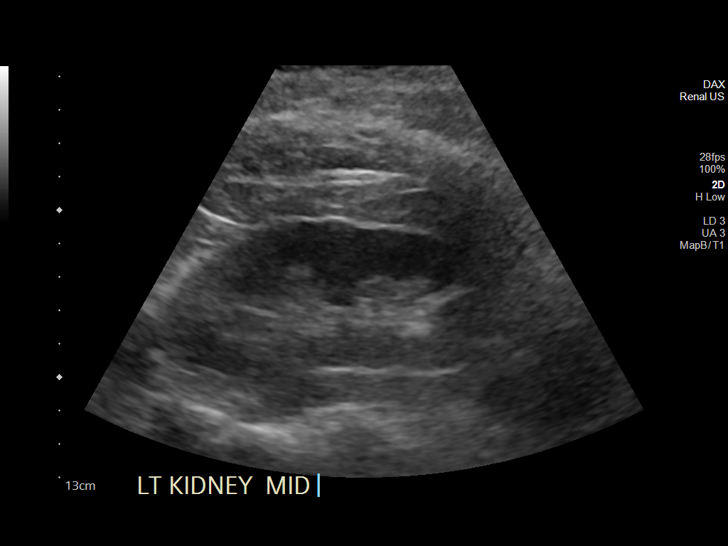
[im 19/31]
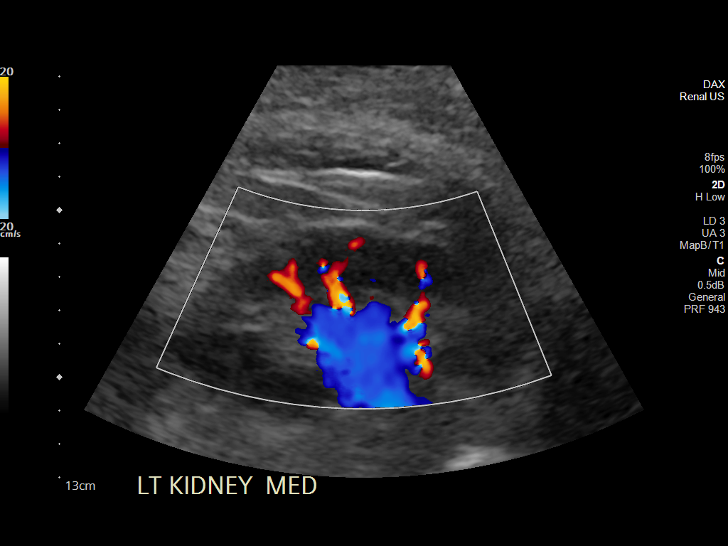
[im 21/31]
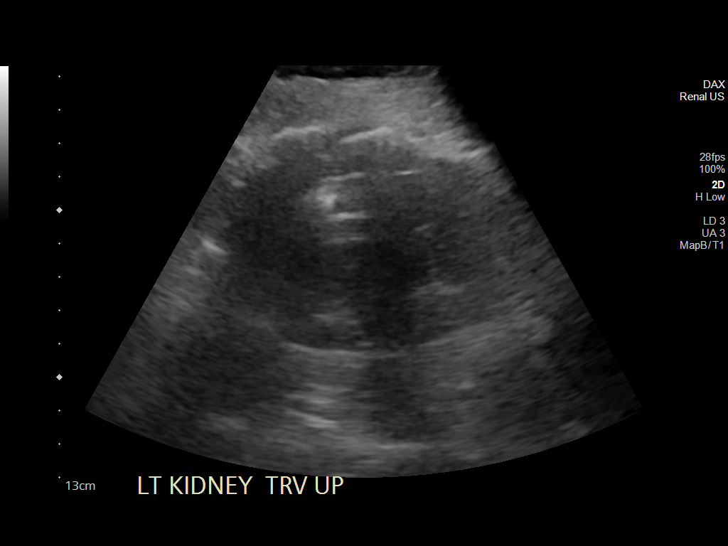
[im 23/31]
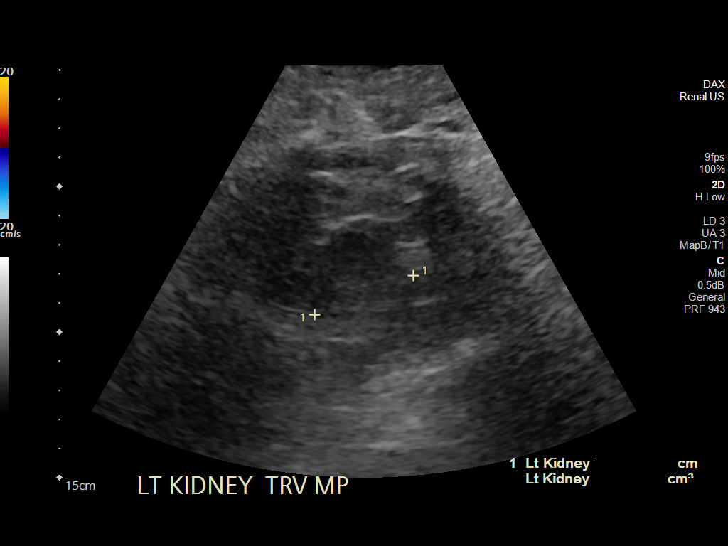
[im 26/31]
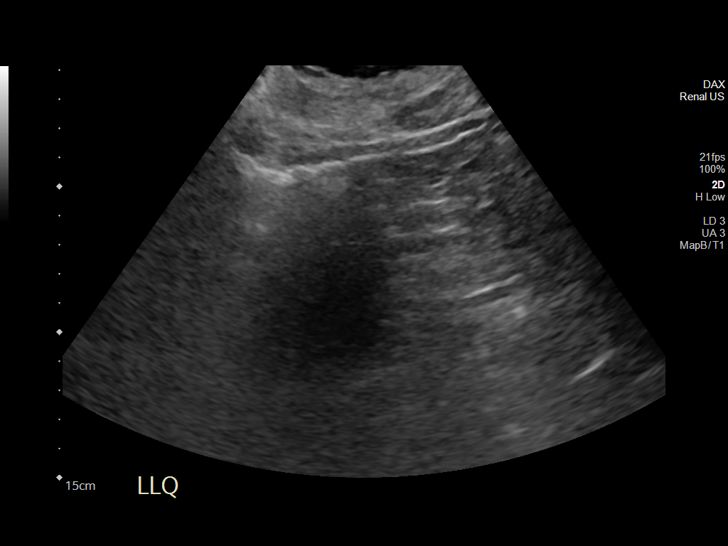
[im 28/31]
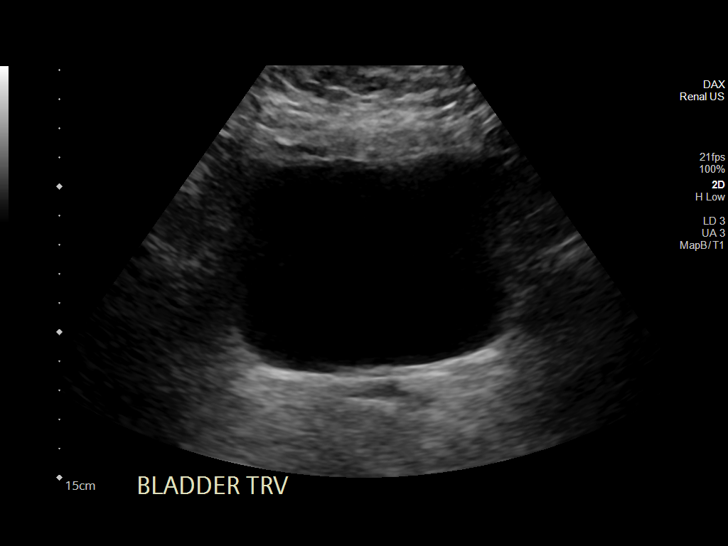
[im 31/31]
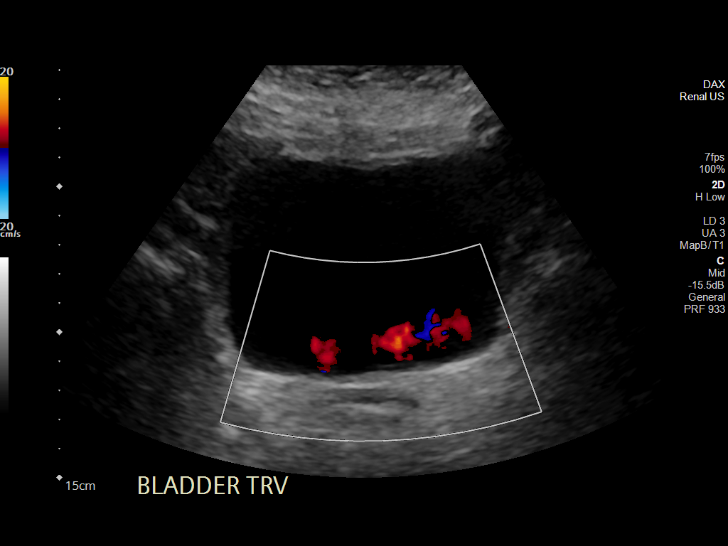

[14 of 25 positions shown; findings below may reference images not displayed]

FINDINGS: Right Kidney:

Renal measurements: 9.9 x 6.0 x 4.5 cm = volume: 139 mL.
Echogenicity within normal limits. No mass or hydronephrosis
visualized.

Left Kidney:

Renal measurements: 9.5 x 4.4 x 3.6 cm = volume: 80 mL. Echogenicity
within normal limits. No mass or hydronephrosis visualized.

Bladder:

Appears normal for degree of bladder distention.

Other:

None.
IMPRESSION: Normal renal ultrasound.

## 2023-04-29 ENCOUNTER — Ambulatory Visit (AMBULATORY_SURGERY_CENTER): Payer: Medicare Other

## 2023-04-29 ENCOUNTER — Telehealth: Payer: Self-pay

## 2023-04-29 VITALS — Ht 65.0 in | Wt 192.0 lb

## 2023-04-29 DIAGNOSIS — Z1211 Encounter for screening for malignant neoplasm of colon: Secondary | ICD-10-CM

## 2023-04-29 MED ORDER — NA SULFATE-K SULFATE-MG SULF 17.5-3.13-1.6 GM/177ML PO SOLN: 1.0000 | Freq: Once | ORAL | 0 refills | Status: AC

## 2023-04-29 NOTE — Progress Notes (Signed)
No egg or soy allergy known to patient  No issues known to pt with past sedation with any surgeries or procedures Patient denies ever being told they had issues or difficulty with intubation  No FH of Malignant Hyperthermia Pt is not on diet pills Pt is not on  home 02  Pt is not on blood thinners  Pt denies has issues with constipation takes colace No A fib or A flutter Have any cardiac testing pending--no Pt can ambulate independently Pt denies use of chewing tobacco Discussed diabetic I weight loss medication holds Discussed NSAID holds Checked BMI Pt instructed to use Singlecare.com or GoodRx for a price reduction on prep  Patient's chart reviewed by Cathlyn Parsons CNRA prior to previsit and patient appropriate for the [facility].  Pre visit completed and red dot placed by patient's name on their procedure day (on provider's schedule).

## 2023-04-29 NOTE — Telephone Encounter (Signed)
Left message that I was trying to reach patient for pre visit.  Will call back in 5 min

## 2023-04-29 NOTE — Telephone Encounter (Signed)
Completed PV

## 2023-05-07 ENCOUNTER — Other Ambulatory Visit: Payer: Self-pay | Admitting: Nurse Practitioner

## 2023-05-07 DIAGNOSIS — I129 Hypertensive chronic kidney disease with stage 1 through stage 4 chronic kidney disease, or unspecified chronic kidney disease: Secondary | ICD-10-CM

## 2023-05-19 ENCOUNTER — Encounter: Payer: Self-pay | Admitting: Certified Registered Nurse Anesthetist

## 2023-05-21 ENCOUNTER — Encounter: Payer: Self-pay | Admitting: Gastroenterology

## 2023-05-23 ENCOUNTER — Ambulatory Visit: Payer: Medicare Other | Admitting: Gastroenterology

## 2023-05-23 ENCOUNTER — Encounter: Payer: Self-pay | Admitting: Gastroenterology

## 2023-05-23 VITALS — BP 111/72 | HR 66 | Temp 98.1°F | Resp 13 | Ht 65.0 in | Wt 192.0 lb

## 2023-05-23 DIAGNOSIS — K644 Residual hemorrhoidal skin tags: Secondary | ICD-10-CM | POA: Diagnosis not present

## 2023-05-23 DIAGNOSIS — Z1211 Encounter for screening for malignant neoplasm of colon: Secondary | ICD-10-CM

## 2023-05-23 DIAGNOSIS — K5730 Diverticulosis of large intestine without perforation or abscess without bleeding: Secondary | ICD-10-CM | POA: Diagnosis not present

## 2023-05-23 DIAGNOSIS — K648 Other hemorrhoids: Secondary | ICD-10-CM | POA: Diagnosis not present

## 2023-05-23 MED ORDER — SODIUM CHLORIDE 0.9 % IV SOLN: 500.0000 mL | Freq: Once | INTRAVENOUS | Status: DC

## 2023-05-23 NOTE — Op Note (Signed)
[facility] Patient Name: Kelli Bond Procedure Date: 05/23/2023 7:55 AM MRN: [id number] Endoscopist: Napoleon Form , MD, [id number] Age: 73 Referring MD:  Date of Birth: 1951/03/05 Gender: Female Account #: [id number] Procedure:                Colonoscopy Indications:              Screening for colorectal malignant neoplasm Medicines:                Monitored Anesthesia Care Procedure:                Pre-Anesthesia Assessment:                           - Prior to the procedure, a History and Physical                            was performed, and patient medications and                            allergies were reviewed. The patient's tolerance of                            previous anesthesia was also reviewed. The risks                            and benefits of the procedure and the sedation                            options and risks were discussed with the patient.                            All questions were answered, and informed consent                            was obtained. Prior Anticoagulants: The patient has                            taken no anticoagulant or antiplatelet agents. ASA                            Grade Assessment: II - A patient with mild systemic                            disease. After reviewing the risks and benefits,                            the patient was deemed in satisfactory condition to                            undergo the procedure.                           After obtaining informed consent, the colonoscope  was passed under direct vision. Throughout the                            procedure, the patient's blood pressure, pulse, and                            oxygen saturations were monitored continuously. The                            Olympus Scope [id number] was introduced through the                            anus and advanced to the the cecum, identified by                             appendiceal orifice and ileocecal valve. The                            colonoscopy was performed without difficulty. The                            patient tolerated the procedure well. The quality                            of the bowel preparation was good. The ileocecal                            valve, appendiceal orifice, and rectum were                            photographed. Scope In: 8:18:56 AM Scope Out: 8:31:42 AM Scope Withdrawal Time: 0 hours 9 minutes 29 seconds  Total Procedure Duration: 0 hours 12 minutes 46 seconds  Findings:                 The perianal and digital rectal examinations were                            normal.                           A few small-mouthed diverticula were found in the                            sigmoid colon.                           Non-bleeding external and internal hemorrhoids were                            found during retroflexion. The hemorrhoids were                            medium-sized. Complications:            No immediate complications. Estimated Blood Loss:     Estimated  blood loss was minimal. Impression:               - Diverticulosis in the sigmoid colon.                           - Non-bleeding external and internal hemorrhoids.                           - No specimens collected. Recommendation:           - Patient has a contact number available for                            emergencies. The signs and symptoms of potential                            delayed complications were discussed with the                            patient. Return to normal activities tomorrow.                            Written discharge instructions were provided to the                            patient.                           - Resume previous diet.                           - Continue present medications.                           - No repeat colonoscopy due to the absence of                            colonic polyps.                            - Return to GI clinic PRN. Napoleon Form, MD 05/23/2023 8:35:50 AM This report has been signed electronically.

## 2023-05-23 NOTE — Progress Notes (Signed)
Cell phone off per pt Pt's states no medical or surgical changes since previsit or office visit.  

## 2023-05-23 NOTE — Progress Notes (Signed)
[facility] History and Physical   Primary Care Physician:  Dorothyann Peng, MD   Reason for Procedure:  Colorectal cancer screening  Plan:    Screening colonoscopy with possible interventions as needed     HPI: Kelli Bond is a very pleasant 73 y.o. female here for screening colonoscopy. Denies any nausea, vomiting, abdominal pain, melena or bright red blood per rectum  The risks and benefits as well as alternatives of endoscopic procedure(s) have been discussed and reviewed. All questions answered. The patient agrees to proceed.    Past Medical History:  Diagnosis Date   Arthritis    Diabetes mellitus without complication (HCC)    Essential hypertension, benign 12/28/2013   Hypertension    Pure hypercholesterolemia 12/28/2013   Thyroid disease     Past Surgical History:  Procedure Laterality Date   ABDOMINAL HYSTERECTOMY     COLONOSCOPY     KNEE SURGERY Left 04/04/2022   ORIF ANKLE FRACTURE Right 10/10/2017   Procedure: OPEN REDUCTION INTERNAL FIXATION (ORIF) ANKLE FRACTURE;  Surgeon: Kennedy Bucker, MD;  Location: [facility] ORS;  Service: Orthopedics;  Laterality: Right;    Prior to Admission medications   Medication Sig Start Date End Date Taking? Authorizing Provider  aspirin EC 81 MG tablet Take 81 mg by mouth daily.   Yes [provider]  cetirizine (ZYRTEC) 10 MG tablet Take 10 mg by mouth daily as needed for allergies. prn   Yes [provider]  cholecalciferol (VITAMIN D) 1000 UNITS tablet Take 1,000 Units by mouth daily.    Yes [provider]  JANUVIA 100 MG tablet TAKE 1 TABLET BY MOUTH EVERY DAY 02/06/23  Yes Dorothyann Peng, MD  Lancets [facility] DELICA PLUS LANCET33G) MISC USE TO CHECK BLOOD SUGAR ONCE DAILY AS DIRECTED 07/13/21  Yes Dorothyann Peng, MD  latanoprost (XALATAN) 0.005 % ophthalmic solution Place 1 drop into both eyes at bedtime.   Yes [provider]  ONETOUCH ULTRA test strip USE TO CHECK BLOOD SUGAR  1 time DAILY. 01/04/21  Yes Dorothyann Peng, MD  prednisoLONE acetate (PRED FORTE) 1 % ophthalmic suspension Place 1 drop into the right eye daily. 01/30/23  Yes [provider]  rosuvastatin (CRESTOR) 20 MG tablet TAKE 1 TABLET BY MOUTH EVERY DAY 02/06/23  Yes Dorothyann Peng, MD  SYNTHROID 112 MCG tablet TAKE 1 TABLET BY MOUTH monday THROUGH saturday and take 1/2 tablet ON sunday 02/01/23  Yes Dorothyann Peng, MD  telmisartan-hydrochlorothiazide (MICARDIS HCT) 80-12.5 MG tablet TAKE 1 TABLET BY MOUTH EVERY DAY 05/07/23  Yes Arnette Felts, FNP  COVID-19 mRNA vaccine [id number] (COMIRNATY) syringe Inject into the muscle. 06/27/22   Judyann Munson, MD  COVID-19 mRNA vaccine [id number] (COMIRNATY) syringe Inject 0.3 mLs into the muscle. 01/15/23     fluticasone (FLONASE) 50 MCG/ACT nasal spray Use 2 sprays each nostril daily prn Patient not taking: Reported on 04/29/2023 06/17/22   Dorothyann Peng, MD  naproxen (NAPROSYN) 375 MG tablet Take 1 tablet (375 mg total) by mouth 2 (two) times daily. Patient not taking: Reported on 05/23/2023 10/09/22   Kommor, Wyn Forster, MD  valACYclovir (VALTREX) 500 MG tablet Take 1 tablet (500 mg total) by mouth 2 (two) times daily. Take one tablet twice daily x 1 week at first symptom of fever blister 03/28/23   Terri Piedra, DO    Current Outpatient Medications  Medication Sig Dispense Refill   aspirin EC 81 MG tablet Take 81 mg by mouth daily.     cetirizine (ZYRTEC) 10 MG  tablet Take 10 mg by mouth daily as needed for allergies. prn     cholecalciferol (VITAMIN D) 1000 UNITS tablet Take 1,000 Units by mouth daily.      JANUVIA 100 MG tablet TAKE 1 TABLET BY MOUTH EVERY DAY 90 tablet 1   Lancets (ONETOUCH DELICA PLUS LANCET33G) MISC USE TO CHECK BLOOD SUGAR ONCE DAILY AS DIRECTED 100 each 1   latanoprost (XALATAN) 0.005 % ophthalmic solution Place 1 drop into both eyes at bedtime.     ONETOUCH ULTRA test strip USE TO CHECK BLOOD SUGAR 1 time DAILY. 150 strip 1    prednisoLONE acetate (PRED FORTE) 1 % ophthalmic suspension Place 1 drop into the right eye daily.     rosuvastatin (CRESTOR) 20 MG tablet TAKE 1 TABLET BY MOUTH EVERY DAY 90 tablet 1   SYNTHROID 112 MCG tablet TAKE 1 TABLET BY MOUTH monday THROUGH saturday and take 1/2 tablet ON sunday 90 tablet 2   telmisartan-hydrochlorothiazide (MICARDIS HCT) 80-12.5 MG tablet TAKE 1 TABLET BY MOUTH EVERY DAY 90 tablet 1   COVID-19 mRNA vaccine 2023-2024 (COMIRNATY) syringe Inject into the muscle. 0.3 mL 0   COVID-19 mRNA vaccine 2024-2025 (COMIRNATY) syringe Inject 0.3 mLs into the muscle. 0.3 mL 0   fluticasone (FLONASE) 50 MCG/ACT nasal spray Use 2 sprays each nostril daily prn (Patient not taking: Reported on 04/29/2023) 16 g 2   naproxen (NAPROSYN) 375 MG tablet Take 1 tablet (375 mg total) by mouth 2 (two) times daily. (Patient not taking: Reported on 05/23/2023) 20 tablet 0   valACYclovir (VALTREX) 500 MG tablet Take 1 tablet (500 mg total) by mouth 2 (two) times daily. Take one tablet twice daily x 1 week at first symptom of fever blister 28 tablet 2   Current Facility-Administered Medications  Medication Dose Route Frequency Provider Last Rate Last Admin   0.9 %  sodium chloride infusion  500 mL Intravenous Once Napoleon Form, MD        Allergies as of 05/23/2023 - Review Complete 05/23/2023  Allergen Reaction Noted   Banana Other (See Comments) 01/02/2012   Sulfa antibiotics Rash 12/04/2010    Family History  Problem Relation Age of Onset   Bradycardia Mother        pacemaker   Hypertension Father    Kidney disease Father    Breast cancer Sister    Kidney disease Brother    Colon cancer Neg Hx    Colon polyps Neg Hx    Esophageal cancer Neg Hx    Rectal cancer Neg Hx    Stomach cancer Neg Hx     Social History   Socioeconomic History   Marital status: Widowed    Spouse name: Not on file   Number of children: Not on file   Years of education: Not on file   Highest  education level: Master's degree (e.g., MA, MS, MEng, MEd, MSW, MBA)  Occupational History   Not on file  Tobacco Use   Smoking status: Never   Smokeless tobacco: Never  Vaping Use   Vaping status: Never Used  Substance and Sexual Activity   Alcohol use: Not Currently   Drug use: Never   Sexual activity: Yes    Birth control/protection: Surgical  Other Topics Concern   Not on file  Social History Narrative   Not on file   Social Drivers of Health   Financial Resource Strain: Low Risk  (10/15/2022)   Overall Financial Resource Strain (CARDIA)    Difficulty  of Paying Living Expenses: Not hard at all  Food Insecurity: No Food Insecurity (10/15/2022)   Hunger Vital Sign    Worried About Running Out of Food in the Last Year: Never true    Ran Out of Food in the Last Year: Never true  Transportation Needs: No Transportation Needs (10/15/2022)   PRAPARE - Administrator, Civil Service (Medical): No    Lack of Transportation (Non-Medical): No  Physical Activity: Insufficiently Active (10/15/2022)   Exercise Vital Sign    Days of Exercise per Week: 2 days    Minutes of Exercise per Session: 30 min  Stress: Stress Concern Present (10/15/2022)   [employer] of Occupational Health - Occupational Stress Questionnaire    Feeling of Stress : To some extent  Social Connections: Moderately Integrated (10/15/2022)   Social Connection and Isolation Panel [NHANES]    Frequency of Communication with Friends and Family: More than three times a week    Frequency of Social Gatherings with Friends and Family: More than three times a week    Attends Religious Services: More than 4 times per year    Active Member of [employer] or Organizations: Yes    Attends Banker Meetings: More than 4 times per year    Marital Status: Widowed  Intimate Partner Violence: Not on file    Review of Systems:  All other review of systems negative except as mentioned in the HPI.  Physical  Exam: Vital signs in last 24 hours: BP 138/75   Pulse (!) 55   Temp 98.1 F (36.7 C)   Resp 18   Ht 5\' 5"  (1.651 m)   Wt 192 lb (87.1 kg)   SpO2 100%   BMI 31.95 kg/m  General:   Alert, NAD Lungs:  Clear .   Heart:  Regular rate and rhythm Abdomen:  Soft, nontender and nondistended. Neuro/Psych:  Alert and cooperative. Normal mood and affect. A and O x 3  Reviewed labs, radiology imaging, old records and pertinent past GI work up  Patient is appropriate for planned procedure(s) and anesthesia in an ambulatory setting   K. Scherry Ran , MD [phone]

## 2023-05-23 NOTE — Progress Notes (Signed)
Report given to PACU, vss 

## 2023-05-23 NOTE — Patient Instructions (Addendum)
Resume previous diet Continue present medications Follow up in GI clinic as needed  There were no colon polyps seen today!  You will NOT need another screening colonoscopy, HOWEVER, please call us at [phone] if you have a change in bowel habits, change in family history of colo-rectal cancer, rectal bleeding or other GI concern in the future  Handouts/information given for diverticulosis and hemorrhoids  YOU HAD AN ENDOSCOPIC PROCEDURE TODAY AT THE [facility]:   Refer to the procedure report that was given to you for any specific questions about what was found during the examination.  If the procedure report does not answer your questions, please call your gastroenterologist to clarify.  If you requested that your care partner not be given the details of your procedure findings, then the procedure report has been included in a sealed envelope for you to review at your convenience later.  YOU SHOULD EXPECT: Some feelings of bloating in the abdomen. Passage of more gas than usual.  Walking can help get rid of the air that was put into your GI tract during the procedure and reduce the bloating. If you had a lower endoscopy (such as a colonoscopy or flexible sigmoidoscopy) you may notice spotting of blood in your stool or on the toilet paper. If you underwent a bowel prep for your procedure, you may not have a normal bowel movement for a few days.  Please Note:  You might notice some irritation and congestion in your nose or some drainage.  This is from the oxygen used during your procedure.  There is no need for concern and it should clear up in a day or so.  SYMPTOMS TO REPORT IMMEDIATELY:  Following lower endoscopy (colonoscopy):  Excessive amounts of blood in the stool  Significant tenderness or worsening of abdominal pains  Swelling of the abdomen that is new, acute  Fever of 100F or higher  For urgent or emergent issues, a gastroenterologist can be reached at any hour by  calling [phone]. Do not use MyChart messaging for urgent concerns.   DIET:  We do recommend a small meal at first, but then you may proceed to your regular diet.  Drink plenty of fluids but you should avoid alcoholic beverages for 24 hours.  ACTIVITY:  You should plan to take it easy for the rest of today and you should NOT DRIVE or use heavy machinery until tomorrow (because of the sedation medicines used during the test).    FOLLOW UP: Our staff will call the number listed on your records the next business day following your procedure.  We will call around 7:15- 8:00 am to check on you and address any questions or concerns that you may have regarding the information given to you following your procedure. If we do not reach you, we will leave a message.      SIGNATURES/CONFIDENTIALITY: You and/or your care partner have signed paperwork which will be entered into your electronic medical record.  These signatures attest to the fact that that the information above on your After Visit Summary has been reviewed and is understood.  Full responsibility of the confidentiality of this discharge information lies with you and/or your care-partner.

## 2023-05-24 ENCOUNTER — Telehealth: Payer: Self-pay

## 2023-05-24 NOTE — Telephone Encounter (Signed)
  Follow up Call-     05/23/2023    7:24 AM  Call back number  Post procedure Call Back phone  # [phone]  Permission to leave phone message Yes     Patient questions:  Do you have a fever, pain , or abdominal swelling? No. Pain Score  0 *  Have you tolerated food without any problems? Yes.    Have you been able to return to your normal activities? Yes.    Do you have any questions about your discharge instructions: Diet   No. Medications  No. Follow up visit  No.  Do you have questions or concerns about your Care? No.  Actions: * If pain score is 4 or above: No action needed, pain <4.

## 2023-05-30 ENCOUNTER — Encounter: Payer: Self-pay | Admitting: Internal Medicine

## 2023-06-19 LAB — HM DIABETES EYE EXAM

## 2023-06-26 ENCOUNTER — Encounter: Payer: Medicare Other | Admitting: Internal Medicine

## 2023-07-01 LAB — HM MAMMOGRAPHY

## 2023-07-03 ENCOUNTER — Ambulatory Visit: Payer: Medicare Other

## 2023-07-03 DIAGNOSIS — Z0000 Encounter for general adult medical examination without abnormal findings: Secondary | ICD-10-CM | POA: Diagnosis not present

## 2023-07-03 NOTE — Patient Instructions (Signed)
 [name] , Thank you for taking time to come for your Medicare Wellness Visit. I appreciate your ongoing commitment to your health goals. Please review the following plan we discussed and let me know if I can assist you in the future.   Referrals/Orders/Follow-Ups/Clinician Recommendations: none  This is a list of the screening recommended for you and due dates:  Health Maintenance  Topic Date Due   Pneumonia Vaccine (3 of 3 - PPSV23 or PCV20) 07/20/2019   COVID-19 Vaccine (8 - 2024-25 season) 03/12/2023   Hemoglobin A1C  08/20/2023   Yearly kidney function blood test for diabetes  02/19/2024   Yearly kidney health urinalysis for diabetes  02/19/2024   Complete foot exam   02/19/2024   Eye exam for diabetics  06/18/2024   Mammogram  06/30/2024   Medicare Annual Wellness Visit  07/02/2024   DTaP/Tdap/Td vaccine (2 - Td or Tdap) 12/30/2028   Flu Shot  Completed   DEXA scan (bone density measurement)  Completed   Hepatitis C Screening  Completed   Zoster (Shingles) Vaccine  Completed   HPV Vaccine  Aged Out   Colon Cancer Screening  Discontinued    Advanced directives: (Copy Requested) Please bring a copy of your health care power of attorney and living will to the office to be added to your chart at your convenience.  Next Medicare Annual Wellness Visit scheduled for next year: No, office will schedule  insert Preventive Care attachment Insert FALL PREVENTION attachment if needed

## 2023-07-03 NOTE — Progress Notes (Signed)
 Subjective:   Kelli Bond is a 73 y.o. who presents for a Medicare Wellness preventive visit.  Visit Complete: Virtual I connected with  Kelli Bond on 07/03/23 by a audio enabled telemedicine application and verified that I am speaking with the correct person using two identifiers.  Patient Location: Home  Provider Location: Office/Clinic  I discussed the limitations of evaluation and management by telemedicine. The patient expressed understanding and agreed to proceed.  Vital Signs: Because this visit was a virtual/telehealth visit, some criteria may be missing or patient reported. Any vitals not documented were not able to be obtained and vitals that have been documented are patient reported.  VideoError- Librarian, academic were attempted between this provider and patient, however failed, due to patient having technical difficulties OR patient did not have access to video capability.  We continued and completed visit with audio only.   AWV Questionnaire: Yes: Patient Medicare AWV questionnaire was completed by the patient on 07/03/2023; I have confirmed that all information answered by patient is correct and no changes since this date.  Cardiac Risk Factors include: advanced age (>6men, >75 women);diabetes mellitus;hypertension     Objective:    Today's Vitals   There is no height or weight on file to calculate BMI.     07/03/2023    4:15 PM 10/09/2022   10:53 AM 06/13/2022    9:29 AM 06/01/2021    2:49 PM 05/26/2020   12:21 PM 10/01/2019    3:14 PM 09/16/2018    3:41 PM  Advanced Directives  Does Patient Have a Medical Advance Directive? Yes No Yes No No No No  Type of Estate agent of [place];Living will  Healthcare Power of [place];Living will      Copy of Healthcare Power of Attorney in Chart? No - copy requested  No - copy requested      Would patient like information on creating a medical advance directive?    No -  Patient declined No - Patient declined No - Patient declined     Current Medications (verified) Outpatient Encounter Medications as of 07/03/2023  Medication Sig   aspirin EC 81 MG tablet Take 81 mg by mouth daily.   cetirizine (ZYRTEC) 10 MG tablet Take 10 mg by mouth daily as needed for allergies. prn   cholecalciferol (VITAMIN D) 1000 UNITS tablet Take 1,000 Units by mouth daily.    JANUVIA 100 MG tablet TAKE 1 TABLET BY MOUTH EVERY DAY   Lancets (ONETOUCH DELICA PLUS LANCET33G) MISC USE TO CHECK BLOOD SUGAR ONCE DAILY AS DIRECTED   latanoprost (XALATAN) 0.005 % ophthalmic solution Place 1 drop into both eyes at bedtime.   naproxen (NAPROSYN) 375 MG tablet Take 1 tablet (375 mg total) by mouth 2 (two) times daily.   ONETOUCH ULTRA test strip USE TO CHECK BLOOD SUGAR 1 time DAILY.   rosuvastatin (CRESTOR) 20 MG tablet TAKE 1 TABLET BY MOUTH EVERY DAY   SYNTHROID 112 MCG tablet TAKE 1 TABLET BY MOUTH monday THROUGH saturday and take 1/2 tablet ON sunday   telmisartan-hydrochlorothiazide (MICARDIS HCT) 80-12.5 MG tablet TAKE 1 TABLET BY MOUTH EVERY DAY   valACYclovir (VALTREX) 500 MG tablet Take 1 tablet (500 mg total) by mouth 2 (two) times daily. Take one tablet twice daily x 1 week at first symptom of fever blister   COVID-19 mRNA vaccine 2023-2024 (COMIRNATY) syringe Inject into the muscle. (Patient not taking: Reported on 07/03/2023)   COVID-19 mRNA vaccine 2024-2025 (  COMIRNATY) syringe Inject 0.3 mLs into the muscle. (Patient not taking: Reported on 07/03/2023)   prednisoLONE acetate (PRED FORTE) 1 % ophthalmic suspension Place 1 drop into the right eye daily. (Patient not taking: Reported on 07/03/2023)   No facility-administered encounter medications on file as of 07/03/2023.    Allergies (verified) Banana and Sulfa antibiotics   History: Past Medical History:  Diagnosis Date   Allergy 2005   pollen ,blooms   Arthritis    Cataract 2020   small   Chronic kidney disease    mild    Diabetes mellitus without complication (HCC)    Essential hypertension, benign 12/28/2013   Hypertension    Pure hypercholesterolemia 12/28/2013   Thyroid disease    Past Surgical History:  Procedure Laterality Date   ABDOMINAL HYSTERECTOMY     CESAREAN SECTION  2706,2376   COLONOSCOPY     EYE SURGERY  2024   [place]   FRACTURE SURGERY  ankle   2019 ,June   JOINT REPLACEMENT  ankle   fracture   KNEE SURGERY Left 04/04/2022   ORIF ANKLE FRACTURE Right 10/10/2017   Procedure: OPEN REDUCTION INTERNAL FIXATION (ORIF) ANKLE FRACTURE;  Surgeon: Kennedy Bucker, MD;  Location: [facility] ORS;  Service: Orthopedics;  Laterality: Right;   Family History  Problem Relation Age of Onset   Bradycardia Mother        pacemaker   Hypertension Father    Kidney disease Father    Cancer Father    Breast cancer Sister    Cancer Sister    Kidney disease Brother    Hypertension Brother    Diabetes Paternal Grandmother    Colon cancer Neg Hx    Colon polyps Neg Hx    Esophageal cancer Neg Hx    Rectal cancer Neg Hx    Stomach cancer Neg Hx    Social History   Socioeconomic History   Marital status: Widowed    Spouse name: Not on file   Number of children: Not on file   Years of education: Not on file   Highest education level: Master's degree (e.g., MA, MS, MEng, MEd, MSW, MBA)  Occupational History   Not on file  Tobacco Use   Smoking status: Never   Smokeless tobacco: Never  Vaping Use   Vaping status: Never Used  Substance and Sexual Activity   Alcohol use: Never   Drug use: Never   Sexual activity: Not Currently    Birth control/protection: Post-menopausal    Comment: hysterectomy  Other Topics Concern   Not on file  Social History Narrative   Not on file   Social Drivers of Health   Financial Resource Strain: Low Risk  (07/03/2023)   Overall Financial Resource Strain (CARDIA)    Difficulty of Paying Living Expenses: Not hard at all  Food Insecurity: No Food Insecurity  (07/03/2023)   Hunger Vital Sign    Worried About Running Out of Food in the Last Year: Never true    Ran Out of Food in the Last Year: Never true  Transportation Needs: No Transportation Needs (07/03/2023)   PRAPARE - Transportation    Lack of Transportation (Medical): No    Lack of Transportation (Non-Medical): No  Physical Activity: Insufficiently Active (07/03/2023)   Exercise Vital Sign    Days of Exercise per Week: 2 days    Minutes of Exercise per Session: 20 min  Stress: No Stress Concern Present (07/03/2023)   [employer] of Occupational Health - Occupational Stress Questionnaire  Feeling of Stress : Not at all  Social Connections: Moderately Integrated (07/03/2023)   Social Connection and Isolation Panel [NHANES]    Frequency of Communication with Friends and Family: More than three times a week    Frequency of Social Gatherings with Friends and Family: More than three times a week    Attends Religious Services: More than 4 times per year    Active Member of [employer] or Organizations: Yes    Attends Banker Meetings: More than 4 times per year    Marital Status: Widowed    Tobacco Counseling Counseling given: Not Answered    Clinical Intake:  Pre-visit preparation completed: Yes  Pain : No/denies pain     Nutritional Risks: None Diabetes: Yes CBG done?: No Did pt. bring in CBG monitor from home?: No  How often do you need to have someone help you when you read instructions, pamphlets, or other written materials from your doctor or pharmacy?: 1 - Never  Interpreter Needed?: No  Information entered by :: NAllen LPN   Activities of Daily Living     07/03/2023    3:49 PM  In your present state of health, do you have any difficulty performing the following activities:  Hearing? 0  Vision? 0  Difficulty concentrating or making decisions? 0  Walking or climbing stairs? 0  Dressing or bathing? 0  Doing errands, shopping? 0  Preparing Food and  eating ? N  Using the Toilet? N  In the past six months, have you accidently leaked urine? N  Do you have problems with loss of bowel control? N  Managing your Medications? N  Managing your Finances? N  Housekeeping or managing your Housekeeping? N    Patient Care Team: Dorothyann Peng, MD as PCP - General (Internal Medicine) [facility], [facility]  Indicate any recent Medical Services you may have received from other than [facility] providers in the past year (date may be approximate).     Assessment:   This is a routine wellness examination for Kelli Bond.  Hearing/Vision screen Hearing Screening - Comments:: Denies hearing issues Vision Screening - Comments:: Regular eye exams, [facility]   Goals Addressed             This Visit's Progress    Patient Stated       07/03/2023, wants to be more active       Depression Screen     07/03/2023    4:16 PM 02/19/2023   12:15 PM 10/16/2022    2:50 PM 06/13/2022    9:30 AM 06/01/2021    2:50 PM 05/26/2020   12:22 PM 01/20/2020    2:14 PM  PHQ 2/9 Scores  PHQ - 2 Score 0 0 0 0 0 0 0  PHQ- 9 Score 0 0 0        Fall Risk     07/03/2023    3:49 PM 02/19/2023   12:15 PM 10/16/2022    2:50 PM 06/13/2022    9:30 AM 06/01/2021    2:50 PM  Fall Risk   Falls in the past year? 0 0 0 0 0  Number falls in past yr: 0 0 0 0   Injury with Fall? 0 0 0 0   Risk for fall due to : Medication side effect No Fall Risks No Fall Risks Medication side effect Medication side effect  Follow up Falls prevention discussed;Falls evaluation completed Falls evaluation completed Falls evaluation completed Falls prevention discussed;Education provided;Falls evaluation completed Falls  evaluation completed;Education provided;Falls prevention discussed    MEDICARE RISK AT HOME:  Medicare Risk at Home Any stairs in or around the home?: (Patient-Rptd) No If so, are there any without handrails?: (Patient-Rptd) No Home free of loose throw rugs in walkways, pet beds,  electrical cords, etc?: (Patient-Rptd) Yes Adequate lighting in your home to reduce risk of falls?: (Patient-Rptd) Yes Life alert?: (Patient-Rptd) No Use of a cane, walker or w/c?: (Patient-Rptd) No Grab bars in the bathroom?: (Patient-Rptd) No Shower chair or bench in shower?: (Patient-Rptd) Yes Elevated toilet seat or a handicapped toilet?: (Patient-Rptd) Yes  TIMED UP AND GO:  Was the test performed?  No  Cognitive Function: 6CIT completed        07/03/2023    4:17 PM 06/13/2022    9:31 AM 06/01/2021    2:52 PM 05/26/2020   12:23 PM 10/01/2019    3:17 PM  6CIT Screen  What Year? 0 points 0 points 0 points 0 points 0 points  What month? 0 points 0 points 0 points 0 points 0 points  What time? 0 points 0 points 0 points 0 points 0 points  Count back from 20 0 points 0 points 0 points 0 points 0 points  Months in reverse 0 points 0 points 0 points 0 points 0 points  Repeat phrase 0 points 2 points 6 points 4 points 0 points  Total Score 0 points 2 points 6 points 4 points 0 points    Immunizations Immunization History  Administered Date(s) Administered   Fluad Quad(high Dose 65+) 01/20/2020, 02/27/2021, 02/05/2023   Influenza, High Dose Seasonal PF 02/04/2018   Influenza,inj,Quad PF,6+ Mos 03/14/2017   Influenza-Unspecified 02/10/2019, 01/30/2022   PFIZER Comirnaty(Gray Top)Covid-19 Tri-Sucrose Vaccine 08/15/2020   PFIZER(Purple Top)SARS-COV-2 Vaccination 05/09/2019, 05/29/2019, 02/19/2020   Pfizer Covid-19 Vaccine Bivalent Booster 54yrs & up 03/23/2021   Pfizer(Comirnaty)Fall Seasonal Vaccine 12 years and older 06/27/2022, 01/15/2023   Pneumococcal Conjugate-13 09/16/2018   Pneumococcal Polysaccharide-23 07/20/2014   Tdap 12/31/2018   Zoster Recombinant(Shingrix) 10/24/2021, 03/15/2022    Screening Tests Health Maintenance  Topic Date Due   Pneumonia Vaccine 47+ Years old (3 of 3 - PPSV23 or PCV20) 07/20/2019   COVID-19 Vaccine (8 - 2024-25 season) 03/12/2023    HEMOGLOBIN A1C  08/20/2023   Diabetic kidney evaluation - eGFR measurement  02/19/2024   Diabetic kidney evaluation - Urine ACR  02/19/2024   FOOT EXAM  02/19/2024   OPHTHALMOLOGY EXAM  06/18/2024   MAMMOGRAM  06/30/2024   Medicare Annual Wellness (AWV)  07/02/2024   DTaP/Tdap/Td (2 - Td or Tdap) 12/30/2028   INFLUENZA VACCINE  Completed   DEXA SCAN  Completed   Hepatitis C Screening  Completed   Zoster Vaccines- Shingrix  Completed   HPV VACCINES  Aged Out   Colonoscopy  Discontinued    Health Maintenance  Health Maintenance Due  Topic Date Due   Pneumonia Vaccine 45+ Years old (3 of 3 - PPSV23 or PCV20) 07/20/2019   COVID-19 Vaccine (8 - 2024-25 season) 03/12/2023   Health Maintenance Items Addressed: Due for pneumonia vaccine  Additional Screening:  Vision Screening: Recommended annual ophthalmology exams for early detection of glaucoma and other disorders of the eye.  Dental Screening: Recommended annual dental exams for proper oral hygiene  Community Resource Referral / Chronic Care Management: CRR required this visit?  No   CCM required this visit?  No     Plan:     I have personally reviewed and noted the following in the  patient's chart:   Medical and social history Use of alcohol, tobacco or illicit drugs  Current medications and supplements including opioid prescriptions. Patient is not currently taking opioid prescriptions. Functional ability and status Nutritional status Physical activity Advanced directives List of other physicians Hospitalizations, surgeries, and ER visits in previous 12 months Vitals Screenings to include cognitive, depression, and falls Referrals and appointments  In addition, I have reviewed and discussed with patient certain preventive protocols, quality metrics, and best practice recommendations. A written personalized care plan for preventive services as well as general preventive health recommendations were provided to  patient.     Barb Merino, LPN   [id number]   After Visit Summary: (MyChart) Due to this being a telephonic visit, the after visit summary with patients personalized plan was offered to patient via MyChart   Notes: Nothing significant to report at this time.

## 2023-08-06 ENCOUNTER — Other Ambulatory Visit: Payer: Self-pay | Admitting: Internal Medicine

## 2023-08-06 DIAGNOSIS — E1122 Type 2 diabetes mellitus with diabetic chronic kidney disease: Secondary | ICD-10-CM

## 2023-09-25 ENCOUNTER — Ambulatory Visit: Payer: Medicare Other | Admitting: Dermatology

## 2023-10-23 ENCOUNTER — Ambulatory Visit: Admitting: Dermatology

## 2023-10-23 VITALS — BP 151/74

## 2023-10-23 DIAGNOSIS — B009 Herpesviral infection, unspecified: Secondary | ICD-10-CM

## 2023-10-23 DIAGNOSIS — L309 Dermatitis, unspecified: Secondary | ICD-10-CM

## 2023-10-23 DIAGNOSIS — L819 Disorder of pigmentation, unspecified: Secondary | ICD-10-CM

## 2023-10-23 DIAGNOSIS — L209 Atopic dermatitis, unspecified: Secondary | ICD-10-CM

## 2023-10-23 DIAGNOSIS — B001 Herpesviral vesicular dermatitis: Secondary | ICD-10-CM

## 2023-10-23 DIAGNOSIS — L814 Other melanin hyperpigmentation: Secondary | ICD-10-CM

## 2023-10-23 MED ORDER — TRIAMCINOLONE ACETONIDE 0.1 % EX OINT
TOPICAL_OINTMENT | CUTANEOUS | 2 refills | Status: AC
Start: 2023-10-23 — End: ?

## 2023-10-23 NOTE — Patient Instructions (Signed)
 Date: Wed Oct 23 2023  Hello Zelphia,  Thank you for visiting today. 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 - Use triamcinolone cream twice daily for up to 2 weeks for eczema flare-ups   - Take a break after 2 weeks of use   - Can restart if symptoms return, but do not use continuously  Skin Care: - Use Eucerin Radiant Tone products:   - Apply serum and sunscreen in the morning   - Apply serum and night cream in the evening - Continue using Roc Retinol eye cream at night - Wear sunscreen every day, especially when outdoors or driving - Wear hats and sunglasses for extra sun protection - Reapply sunscreen when at the beach  Follow-up: - Return for follow-up appointment in 6 months  Please reach out if you have any questions or concerns.  Warm regards,  Dr. Louana Roup [facility]    Important Information  Due to recent changes in healthcare laws, you may see results of your pathology and/or laboratory studies on MyChart before the doctors have had a chance to review them. We understand that in some cases there may be results that are confusing or concerning to you. Please understand that not all results are received at the same time and often the doctors may need to interpret multiple results in order to provide you with the best plan of care or course of treatment. Therefore, we ask that you please give us  2 business days to thoroughly review all your results before contacting the office for clarification. Should we see a critical lab result, you will be contacted sooner.   If You Need Anything After Your Visit  If you have any questions or concerns for your doctor, please call our main line at [phone] If no one answers, please leave a voicemail as directed and we will return your call as soon as possible. Messages left after 4 pm will be answered the following business day.   You may also send us   a message via MyChart. We typically respond to MyChart messages within 1-2 business days.  For prescription refills, please ask your pharmacy to contact our office. Our fax number is [phone].  If you have an urgent issue when the clinic is closed that cannot wait until the next business day, you can page your doctor at the number below.    Please note that while we do our best to be available for urgent issues outside of office hours, we are not available 24/7.   If you have an urgent issue and are unable to reach us , you may choose to seek medical care at your doctor's office, retail clinic, urgent care center, or emergency room.  If you have a medical emergency, please immediately call 911 or go to the emergency department. In the event of inclement weather, please call our main line at [phone] for an update on the status of any delays or closures.  Dermatology Medication Tips: Please keep the boxes that topical medications come in in order to help keep track of the instructions about where and how to use these. Pharmacies typically print the medication instructions only on the boxes and not directly on the medication tubes.   If your medication is too expensive, please contact our office at [phone] or send us  a message through MyChart.   We are unable to tell what your co-pay for medications will be in advance as  this is different depending on your insurance coverage. However, we may be able to find a substitute medication at lower cost or fill out paperwork to get insurance to cover a needed medication.   If a prior authorization is required to get your medication covered by your insurance company, please allow us  1-2 business days to complete this process.  Drug prices often vary depending on where the prescription is filled and some pharmacies may offer cheaper prices.  The website www.goodrx.com contains coupons for medications through different pharmacies. The prices  here do not account for what the cost may be with help from insurance (it may be cheaper with your insurance), but the website can give you the price if you did not use any insurance.  - You can print the associated coupon and take it with your prescription to the pharmacy.  - You may also stop by our office during regular business hours and pick up a GoodRx coupon card.  - If you need your prescription sent electronically to a different pharmacy, notify our office through [facility] or by phone at [phone]

## 2023-10-23 NOTE — Progress Notes (Signed)
   Follow-Up Visit   Subjective  Kelli Bond is a 73 y.o. female who presents for the following: HSV & Periorbital Hyperpigmentation and Wrinkles (Crow's Feet)   Patient present today for follow up visit. Patient was last evaluated on 03/28/23. At this visit patient was prescribed Valtrex . Patient stated she has had to take the medication for 2 outbreaks since her last OV. We also recommended ROC Retinol Eye cream which pt is applying daily but unsure if she is seeing results. Patient denies medication changes.  The following portions of the chart were reviewed this encounter and updated as appropriate: medications, allergies, medical history  Review of Systems:  No other skin or systemic complaints except as noted in HPI or Assessment and Plan.  Objective  Well appearing patient in no apparent distress; mood and affect are within normal limits.   A focused examination was performed of the following areas: face   Relevant exam findings are noted in the Assessment and Plan.           Assessment & Plan   1. Recurrent herpes labialis - Assessment: Patient reports 2 episodes of cold sores since last visit in November. Current frequency does not warrant daily prophylaxis.  - Plan:    Continue valacyclovir  as needed for outbreaks    Discussed option of daily prophylaxis if frequency increases  2. Periorbital hyperpigmentation - Assessment: Patient reports minimal improvement with current treatment regimen. Inadequate sun protection may be contributing to lack of significant improvement.  - Plan:    Discontinue Rock eye cream    Start Excedrin Radiant Tone regimen:     - AM: Apply Excedrin Radiant Tone serum and sunscreen     - PM: Apply Excedrin Radiant Tone serum with or without cream    Patient education:     - Educated on importance of daily sunscreen use, reapplication, and additional sun protection measures (hats, sunglasses)    Provided samples of Excedrin Radiant Tone  products    Follow up in 6 months for reassessment  3. Heat-induced eczema - Assessment:  Patient reports heat-induced rash in neck and antecubital fossa areas, consistent with eczema exacerbation.  - Plan:    Prescribe triamcinolone (strength not specified)     - Apply twice daily for 2 weeks, then discontinue     - May restart if symptoms recur after a break    Patient education:     - Educated on proper use and potential side effects of prolonged steroid use    No follow-ups on file.   Documentation: I have reviewed the above documentation for accuracy and completeness, and I agree with the above.   I, Shirron Louanne Roussel, CMA, am acting as scribe for [employer], DO.   Louana Roup, DO

## 2023-10-31 ENCOUNTER — Other Ambulatory Visit: Payer: Self-pay | Admitting: Nurse Practitioner

## 2023-10-31 ENCOUNTER — Other Ambulatory Visit: Payer: Self-pay | Admitting: Internal Medicine

## 2023-10-31 DIAGNOSIS — I129 Hypertensive chronic kidney disease with stage 1 through stage 4 chronic kidney disease, or unspecified chronic kidney disease: Secondary | ICD-10-CM

## 2023-12-23 ENCOUNTER — Encounter: Payer: Self-pay | Admitting: Internal Medicine

## 2023-12-23 ENCOUNTER — Ambulatory Visit (INDEPENDENT_AMBULATORY_CARE_PROVIDER_SITE_OTHER): Admitting: Internal Medicine

## 2023-12-23 VITALS — BP 130/70 | HR 52 | Temp 98.2°F | Ht 65.0 in | Wt 195.6 lb

## 2023-12-23 DIAGNOSIS — E039 Hypothyroidism, unspecified: Secondary | ICD-10-CM | POA: Diagnosis not present

## 2023-12-23 DIAGNOSIS — N1831 Chronic kidney disease, stage 3a: Secondary | ICD-10-CM

## 2023-12-23 DIAGNOSIS — G8929 Other chronic pain: Secondary | ICD-10-CM

## 2023-12-23 DIAGNOSIS — E6609 Other obesity due to excess calories: Secondary | ICD-10-CM

## 2023-12-23 DIAGNOSIS — E2839 Other primary ovarian failure: Secondary | ICD-10-CM

## 2023-12-23 DIAGNOSIS — E66811 Obesity, class 1: Secondary | ICD-10-CM | POA: Diagnosis not present

## 2023-12-23 DIAGNOSIS — Z6832 Body mass index (BMI) 32.0-32.9, adult: Secondary | ICD-10-CM

## 2023-12-23 DIAGNOSIS — I129 Hypertensive chronic kidney disease with stage 1 through stage 4 chronic kidney disease, or unspecified chronic kidney disease: Secondary | ICD-10-CM

## 2023-12-23 DIAGNOSIS — E1122 Type 2 diabetes mellitus with diabetic chronic kidney disease: Secondary | ICD-10-CM

## 2023-12-23 DIAGNOSIS — I739 Peripheral vascular disease, unspecified: Secondary | ICD-10-CM

## 2023-12-23 DIAGNOSIS — R0989 Other specified symptoms and signs involving the circulatory and respiratory systems: Secondary | ICD-10-CM

## 2023-12-23 NOTE — Patient Instructions (Signed)
 Hypertension, Adult Hypertension is another name for high blood pressure. High blood pressure forces your heart to work harder to pump blood. This can cause problems over time. There are two numbers in a blood pressure reading. There is a top number (systolic) over a bottom number (diastolic). It is best to have a blood pressure that is below 120/80. What are the causes? The cause of this condition is not known. Some other conditions can lead to high blood pressure. What increases the risk? Some lifestyle factors can make you more likely to develop high blood pressure: Smoking. Not getting enough exercise or physical activity. Being overweight. Having too much fat, sugar, calories, or salt (sodium) in your diet. Drinking too much alcohol . Other risk factors include: Having any of these conditions: Heart disease. Diabetes. High cholesterol. Kidney disease. Obstructive sleep apnea. Having a family history of high blood pressure and high cholesterol. Age. The risk increases with age. Stress. What are the signs or symptoms? High blood pressure may not cause symptoms. Very high blood pressure (hypertensive crisis) may cause: Headache. Fast or uneven heartbeats (palpitations). Shortness of breath. Nosebleed. Vomiting or feeling like you may vomit (nauseous). Changes in how you see. Very bad chest pain. Feeling dizzy. Seizures. How is this treated? This condition is treated by making healthy lifestyle changes, such as: Eating healthy foods. Exercising more. Drinking less alcohol . Your doctor may prescribe medicine if lifestyle changes do not help enough and if: Your top number is above 130. Your bottom number is above 80. Your personal target blood pressure may vary. Follow these instructions at home: Eating and drinking  If told, follow the DASH eating plan. To follow this plan: Fill one half of your plate at each meal with fruits and vegetables. Fill one fourth of your plate  at each meal with whole grains. Whole grains include whole-wheat pasta, brown rice, and whole-grain bread. Eat or drink low-fat dairy products, such as skim milk or low-fat yogurt. Fill one fourth of your plate at each meal with low-fat (lean) proteins. Low-fat proteins include fish, chicken without skin, eggs, beans, and tofu. Avoid fatty meat, cured and processed meat, or chicken with skin. Avoid pre-made or processed food. Limit the amount of salt in your diet to less than 1,500 mg each day. Do not drink alcohol  if: Your doctor tells you not to drink. You are pregnant, may be pregnant, or are planning to become pregnant. If you drink alcohol : Limit how much you have to: 0-1 drink a day for women. 0-2 drinks a day for men. Know how much alcohol  is in your drink. In the [place] one drink equals one 12 oz bottle of beer (355 mL), one 5 oz glass of wine (148 mL), or one 1 oz glass of hard liquor (44 mL). Lifestyle  Work with your doctor to stay at a healthy weight or to lose weight. Ask your doctor what the best weight is for you. Get at least 30 minutes of exercise that causes your heart to beat faster (aerobic exercise) most days of the week. This may include walking, swimming, or biking. Get at least 30 minutes of exercise that strengthens your muscles (resistance exercise) at least 3 days a week. This may include lifting weights or doing Pilates. Do not smoke or use any products that contain nicotine  or tobacco. If you need help quitting, ask your doctor. Check your blood pressure at home as told by your doctor. Keep all follow-up visits. Medicines Take over-the-counter and prescription medicines  only as told by your doctor. Follow directions carefully. Do not skip doses of blood pressure medicine. The medicine does not work as well if you skip doses. Skipping doses also puts you at risk for problems. Ask your doctor about side effects or reactions to medicines that you should watch  for. Contact a doctor if: You think you are having a reaction to the medicine you are taking. You have headaches that keep coming back. You feel dizzy. You have swelling in your ankles. You have trouble with your vision. Get help right away if: You get a very bad headache. You start to feel mixed up (confused). You feel weak or numb. You feel faint. You have very bad pain in your: Chest. Belly (abdomen). You vomit more than once. You have trouble breathing. These symptoms may be an emergency. Get help right away. Call 911. Do not wait to see if the symptoms will go away. Do not drive yourself to the hospital. Summary Hypertension is another name for high blood pressure. High blood pressure forces your heart to work harder to pump blood. For most people, a normal blood pressure is less than 120/80. Making healthy choices can help lower blood pressure. If your blood pressure does not get lower with healthy choices, you may need to take medicine. This information is not intended to replace advice given to you by your health care provider. Make sure you discuss any questions you have with your health care provider. Document Revised: 02/09/2021 Document Reviewed: 02/09/2021 Elsevier Patient Education  2024 [employer].

## 2023-12-23 NOTE — Progress Notes (Signed)
 I,Victoria T Emmitt, CMA,acting as a Neurosurgeon for Catheryn LOISE Slocumb, MD.,have documented all relevant documentation on the behalf of Catheryn LOISE Slocumb, MD,as directed by  Catheryn LOISE Slocumb, MD while in the presence of Catheryn LOISE Slocumb, MD.  Subjective:  Patient ID: Kelli Bond , female    DOB: 08/16/1950 , 73 y.o.   MRN: [id number]  Chief Complaint  Patient presents with   Hypertension    Patient presents today for bp, dm & thyroid follow up. She reports compliance with medications. Denies headache, chest pain & sob.    Diabetes   Hypothyroidism    HPI Discussed the use of AI scribe software for clinical note transcription with the patient, who gave verbal consent to proceed.  History of Present Illness Kelli Bond is a 73 year old female with diabetes and hypertension who presents for follow-up on peripheral artery disease screening.  She is here for follow-up after a positive screening test for peripheral artery disease (PAD). Screening was performed by [facility].  She experiences no calf pain when walking but notes persistent pain from a previous leg fracture.  She has a history of diabetes and monitors her blood sugar at home. Her highest reading was 140 mg/dL after consuming ice cream, but it usually runs around 125 mg/dL in the morning. She consumes a small amount of orange juice in the morning, which may elevate her blood sugar levels.  Her blood pressure today was 130/70 mmHg. She takes telmisartan  HCT 80/12.5 mg for hypertension.  She is currently taking several medications including aspirin, Zyrtec as needed, Januvia , rosuvastatin  20 mg, and Synthroid  112 mcg (brand) with a specific dosing schedule. She also uses triamcinolone  cream.   Diabetes She presents for her follow-up diabetic visit. She has type 2 diabetes mellitus. Her disease course has been stable. There are no hypoglycemic associated symptoms. Pertinent negatives for diabetes include no blurred vision, no chest  pain, no polydipsia, no polyphagia and no polyuria. There are no hypoglycemic complications. Risk factors for coronary artery disease include diabetes mellitus, dyslipidemia, hypertension, obesity, sedentary lifestyle and post-menopausal. She participates in exercise intermittently. Eye exam is current.  Hypertension This is a chronic problem. The current episode started more than 1 year ago. The problem has been gradually improving since onset. The problem is controlled. Associated symptoms include neck pain. Pertinent negatives include no blurred vision, chest pain, palpitations or shortness of breath. Risk factors for coronary artery disease include diabetes mellitus, dyslipidemia, post-menopausal state and sedentary lifestyle.     Past Medical History:  Diagnosis Date   Allergy 2005   pollen ,blooms   Arthritis    Cataract 2020   small   Chronic kidney disease    mild   Diabetes mellitus without complication (HCC)    Essential hypertension, benign 12/28/2013   Hypertension    Pure hypercholesterolemia 12/28/2013   Thyroid disease      Family History  Problem Relation Age of Onset   Bradycardia Mother        pacemaker   Hypertension Father    Kidney disease Father    Cancer Father    Breast cancer Sister    Cancer Sister    Kidney disease Brother    Hypertension Brother    Diabetes Paternal Grandmother    Colon cancer Neg Hx    Colon polyps Neg Hx    Esophageal cancer Neg Hx    Rectal cancer Neg Hx    Stomach cancer Neg Hx  Current Outpatient Medications:    aspirin EC 81 MG tablet, Take 81 mg by mouth daily., Disp: , Rfl:    cetirizine (ZYRTEC) 10 MG tablet, Take 10 mg by mouth daily as needed for allergies. prn, Disp: , Rfl:    cholecalciferol (VITAMIN D ) 1000 UNITS tablet, Take 1,000 Units by mouth daily. , Disp: , Rfl:    COVID-19 mRNA vaccine 2023-2024 (COMIRNATY ) syringe, Inject into the muscle., Disp: 0.3 mL, Rfl: 0   COVID-19 mRNA vaccine 2024-2025  (COMIRNATY ) syringe, Inject 0.3 mLs into the muscle., Disp: 0.3 mL, Rfl: 0   JANUVIA  100 MG tablet, TAKE 1 TABLET BY MOUTH ONCE DAILY, Disp: 90 tablet, Rfl: 1   Lancets (ONETOUCH DELICA PLUS LANCET33G) MISC, USE TO CHECK BLOOD SUGAR ONCE DAILY AS DIRECTED, Disp: 100 each, Rfl: 1   latanoprost (XALATAN) 0.005 % ophthalmic solution, Place 1 drop into both eyes at bedtime., Disp: , Rfl:    ONETOUCH ULTRA test strip, USE TO CHECK BLOOD SUGAR 1 time DAILY., Disp: 150 strip, Rfl: 1   rosuvastatin  (CRESTOR ) 20 MG tablet, TAKE 1 TABLET BY MOUTH ONCE DAILY, Disp: 90 tablet, Rfl: 1   SYNTHROID  112 MCG tablet, TAKE 1 TABLET BY MOUTH monday THROUGH saturday and take 1/2 tablet ON sunday, Disp: 90 tablet, Rfl: 2   telmisartan -hydrochlorothiazide (MICARDIS  HCT) 80-12.5 MG tablet, TAKE 1 TABLET BY MOUTH EVERY DAY, Disp: 90 tablet, Rfl: 1   triamcinolone  ointment (KENALOG ) 0.1 %, apply to affected areas BID for 2 weeks then break for 2 weeks. Repeat PRN., Disp: 80 g, Rfl: 2   valACYclovir  (VALTREX ) 500 MG tablet, Take 1 tablet (500 mg total) by mouth 2 (two) times daily. Take one tablet twice daily x 1 week at first symptom of fever blister, Disp: 28 tablet, Rfl: 2   Allergies  Allergen Reactions   Banana Other (See Comments)    Tingling in mouth.   Sulfa Antibiotics Rash     Review of Systems  Constitutional: Negative.   Eyes:  Negative for blurred vision.  Respiratory: Negative.  Negative for shortness of breath.   Cardiovascular: Negative.  Negative for chest pain and palpitations.  Gastrointestinal: Negative.   Endocrine: Negative for polydipsia, polyphagia and polyuria.  Musculoskeletal:  Positive for neck pain.  Neurological: Negative.   Psychiatric/Behavioral: Negative.       Today's Vitals   12/23/23 1622  BP: 130/70  Pulse: (!) 52  Temp: 98.2 F (36.8 C)  SpO2: 98%  Weight: 195 lb 9.6 oz (88.7 kg)  Height: 5' 5 (1.651 m)   Body mass index is 32.55 kg/m.  Wt Readings from Last 3  Encounters:  12/23/23 195 lb 9.6 oz (88.7 kg)  05/23/23 192 lb (87.1 kg)  04/29/23 192 lb (87.1 kg)     Objective:  Physical Exam Vitals and nursing note reviewed.  Constitutional:      Appearance: Normal appearance.  HENT:     Head: Normocephalic and atraumatic.  Eyes:     Extraocular Movements: Extraocular movements intact.  Cardiovascular:     Rate and Rhythm: Normal rate and regular rhythm.     Heart sounds: Normal heart sounds.  Pulmonary:     Effort: Pulmonary effort is normal.     Breath sounds: Normal breath sounds.  Musculoskeletal:     Cervical back: Normal range of motion and neck supple.  Skin:    General: Skin is warm.  Neurological:     General: No focal deficit present.     Mental Status:  She is alert.  Psychiatric:        Mood and Affect: Mood normal.        Behavior: Behavior normal.      Assessment And Plan:  Hypertensive nephropathy Assessment & Plan: Chronic, fair control. Goal BP<130/80.  She is encouraged to follow low sodium diet and to incorporate more exercise into her daily routine.  - She will continue with telmisartan  80/12.5mg  daily for now.     Type 2 diabetes mellitus with stage 3a chronic kidney disease, without long-term current use of insulin [facility]) Assessment & Plan: Chronic, unfortunately, she was last seen October 2024.  Blood glucose levels range from 125 to 140 mg/dL. A1c satisfactory in October 2024. Advised against morning orange juice due to blood sugar spikes. - Order labs to assess current A1c and other relevant parameters. - Advise to avoid orange juice in the morning to prevent blood sugar spikes. - Continue with Januvia  for now - Consider switching/adding SGLT2i for renal/cardiac protection Kidney function in stage 3A range. No proteinuria. Emphasized hydration. - Ensure adequate hydration to maintain kidney function.  Orders: -     CBC -     CMP14+EGFR -     Lipid panel -     Hemoglobin A1c  Primary  hypothyroidism Assessment & Plan: Taking Synthroid  112 mcg. Thyroid function good in last assessment. - I will check thyroid panel and adjust meds as needed.   Orders: -     TSH + free T4  Decreased pedal pulses Assessment & Plan: Screening test positive for PAD.  Arterial Doppler needed to assess current blood flow and prevent complications. - Order arterial Doppler to assess blood flow in extremities. - Refer to Vascular for further evaluation  Orders: -     Ambulatory referral to Vascular Surgery -     VAS US  LOWER EXT ART SEG MULTI (SEGMENTALS & LE RAYNAUDS); Future  Estrogen deficiency -     DG Bone Density; Future  Class 1 obesity due to excess calories with serious comorbidity and body mass index (BMI) of 32.0 to 32.9 in adult Assessment & Plan: She is encouraged to strive for BMI less than 30 to decrease cardiac risk. Advised to aim for at least 150 minutes of exercise per week.     Return if symptoms worsen or fail to improve.  Patient was given opportunity to ask questions. Patient verbalized understanding of the plan and was able to repeat key elements of the plan. All questions were answered to their satisfaction.   I, Catheryn LOISE Slocumb, MD, have reviewed all documentation for this visit. The documentation on 12/23/23 for the exam, diagnosis, procedures, and orders are all accurate and complete.   IF YOU HAVE BEEN REFERRED TO A SPECIALIST, IT MAY TAKE 1-2 WEEKS TO SCHEDULE/PROCESS THE REFERRAL. IF YOU HAVE NOT HEARD FROM US /SPECIALIST IN TWO WEEKS, PLEASE GIVE US  A CALL AT [phone].   THE PATIENT IS ENCOURAGED TO PRACTICE SOCIAL DISTANCING DUE TO THE COVID-19 PANDEMIC.

## 2023-12-24 LAB — CMP14+EGFR
ALT: 16 IU/L (ref 0–32)
AST: 28 IU/L (ref 0–40)
Albumin: 4.6 g/dL (ref 3.8–4.8)
Alkaline Phosphatase: 84 IU/L (ref 44–121)
BUN/Creatinine Ratio: 11 — ABNORMAL LOW (ref 12–28)
BUN: 14 mg/dL (ref 8–27)
Bilirubin Total: 0.4 mg/dL (ref 0.0–1.2)
CO2: 24 mmol/L (ref 20–29)
Calcium: 9.9 mg/dL (ref 8.7–10.3)
Chloride: 100 mmol/L (ref 96–106)
Creatinine, Ser: 1.22 mg/dL — ABNORMAL HIGH (ref 0.57–1.00)
Globulin, Total: 2.5 g/dL (ref 1.5–4.5)
Glucose: 83 mg/dL (ref 70–99)
Potassium: 4.6 mmol/L (ref 3.5–5.2)
Sodium: 139 mmol/L (ref 134–144)
Total Protein: 7.1 g/dL (ref 6.0–8.5)
eGFR: 47 mL/min/1.73 — ABNORMAL LOW (ref >59)

## 2023-12-24 LAB — LIPID PANEL
Chol/HDL Ratio: 2.8 ratio (ref 0.0–4.4)
Cholesterol, Total: 133 mg/dL (ref 100–199)
HDL: 48 mg/dL (ref >39)
LDL Chol Calc (NIH): 71 mg/dL (ref 0–99)
Triglycerides: 71 mg/dL (ref 0–149)
VLDL Cholesterol Cal: 14 mg/dL (ref 5–40)

## 2023-12-24 LAB — HEMOGLOBIN A1C
Est. average glucose Bld gHb Est-mCnc: 137 mg/dL
Hgb A1c MFr Bld: 6.4 % — ABNORMAL HIGH (ref 4.8–5.6)

## 2023-12-24 LAB — CBC
Hematocrit: 43.2 % (ref 34.0–46.6)
Hemoglobin: 13.7 g/dL (ref 11.1–15.9)
MCH: 26.1 pg — ABNORMAL LOW (ref 26.6–33.0)
MCHC: 31.7 g/dL (ref 31.5–35.7)
MCV: 82 fL (ref 79–97)
Platelets: 251 x10E3/uL (ref 150–450)
RBC: 5.24 x10E6/uL (ref 3.77–5.28)
RDW: 14.2 % (ref 11.7–15.4)
WBC: 7 x10E3/uL (ref 3.4–10.8)

## 2023-12-24 LAB — TSH+FREE T4
Free T4: 1.36 ng/dL (ref 0.82–1.77)
TSH: 2.06 u[IU]/mL (ref 0.450–4.500)

## 2023-12-26 ENCOUNTER — Ambulatory Visit: Payer: Self-pay | Admitting: Internal Medicine

## 2023-12-28 DIAGNOSIS — R0989 Other specified symptoms and signs involving the circulatory and respiratory systems: Secondary | ICD-10-CM | POA: Insufficient documentation

## 2023-12-28 NOTE — Assessment & Plan Note (Signed)
 Chronic, fair control. Goal BP<130/80.  She is encouraged to follow low sodium diet and to incorporate more exercise into her daily routine.  - She will continue with telmisartan  80/12.5mg  daily for now.

## 2023-12-28 NOTE — Assessment & Plan Note (Signed)
 Taking Synthroid  112 mcg. Thyroid function good in last assessment. - I will check thyroid panel and adjust meds as needed.

## 2023-12-28 NOTE — Assessment & Plan Note (Addendum)
 Chronic, unfortunately, she was last seen October 2024.  Blood glucose levels range from 125 to 140 mg/dL. A1c satisfactory in October 2024. Advised against morning orange juice due to blood sugar spikes. - Order labs to assess current A1c and other relevant parameters. - Advise to avoid orange juice in the morning to prevent blood sugar spikes. - Continue with Januvia  for now - Consider switching/adding SGLT2i for renal/cardiac protection Kidney function in stage 3A range. No proteinuria. Emphasized hydration. - Ensure adequate hydration to maintain kidney function.

## 2023-12-28 NOTE — Assessment & Plan Note (Signed)
 She is encouraged to strive for BMI less than 30 to decrease cardiac risk. Advised to aim for at least 150 minutes of exercise per week.

## 2023-12-28 NOTE — Assessment & Plan Note (Addendum)
 Screening test positive for PAD.  Arterial Doppler needed to assess current blood flow and prevent complications. - Order arterial Doppler to assess blood flow in extremities. - Refer to Vascular for further evaluation

## 2024-01-08 ENCOUNTER — Ambulatory Visit (HOSPITAL_COMMUNITY)
Admission: RE | Admit: 2024-01-08 | Discharge: 2024-01-08 | Disposition: A | Discharge status: Home / Self Care | Source: Ambulatory Visit | Attending: Vascular Surgery | Admitting: Vascular Surgery

## 2024-01-08 DIAGNOSIS — R0989 Other specified symptoms and signs involving the circulatory and respiratory systems: Secondary | ICD-10-CM | POA: Insufficient documentation

## 2024-01-08 LAB — VAS US ABI WITH/WO TBI
Left ABI: 0.99
Right ABI: 1.08

## 2024-01-22 ENCOUNTER — Other Ambulatory Visit: Payer: Self-pay | Admitting: Internal Medicine

## 2024-01-27 NOTE — Progress Notes (Unsigned)
 Patient name: Kelli Bond MRN: [id number] DOB: 1951/01/23 Sex: female  REASON FOR CONSULT: Decreased pedal pulses  HPI: Kelli Bond is a 73 y.o. female, with history of CKD, diabetes, hypertension who presents for evaluation of decreased pedal pulses.  Patient did have ABIs on 01/08/2024 that were 1.08 on the right triphasic and 0.99 on the left triphasic.  Past Medical History:  Diagnosis Date   Allergy 2005   pollen ,blooms   Arthritis    Cataract 2020   small   Chronic kidney disease    mild   Diabetes mellitus without complication (HCC)    Essential hypertension, benign 12/28/2013   Hypertension    Pure hypercholesterolemia 12/28/2013   Thyroid disease     Past Surgical History:  Procedure Laterality Date   ABDOMINAL HYSTERECTOMY     CESAREAN SECTION  8020,8019   COLONOSCOPY     EYE SURGERY  2024   [place]   FRACTURE SURGERY  ankle   2019 ,June   JOINT REPLACEMENT  ankle   fracture   KNEE SURGERY Left 04/04/2022   ORIF ANKLE FRACTURE Right 10/10/2017   Procedure: OPEN REDUCTION INTERNAL FIXATION (ORIF) ANKLE FRACTURE;  Surgeon: Kathlynn Sharper, MD;  Location: [facility] ORS;  Service: Orthopedics;  Laterality: Right;    Family History  Problem Relation Age of Onset   Bradycardia Mother        pacemaker   Hypertension Father    Kidney disease Father    Cancer Father    Breast cancer Sister    Cancer Sister    Kidney disease Brother    Hypertension Brother    Diabetes Paternal Grandmother    Colon cancer Neg Hx    Colon polyps Neg Hx    Esophageal cancer Neg Hx    Rectal cancer Neg Hx    Stomach cancer Neg Hx     SOCIAL HISTORY: Social History   Socioeconomic History   Marital status: Widowed    Spouse name: Not on file   Number of children: Not on file   Years of education: Not on file   Highest education level: Master's degree (e.g., MA, MS, MEng, MEd, MSW, MBA)  Occupational History   Not on file  Tobacco Use   Smoking status: Never    Smokeless tobacco: Never  Vaping Use   Vaping status: Never Used  Substance and Sexual Activity   Alcohol use: Never   Drug use: Never   Sexual activity: Not Currently    Birth control/protection: Post-menopausal    Comment: hysterectomy  Other Topics Concern   Not on file  Social History Narrative   Not on file   Social Drivers of Health   Financial Resource Strain: Low Risk  (12/20/2023)   Overall Financial Resource Strain (CARDIA)    Difficulty of Paying Living Expenses: Not hard at all  Food Insecurity: No Food Insecurity (12/20/2023)   Hunger Vital Sign    Worried About Running Out of Food in the Last Year: Never true    Ran Out of Food in the Last Year: Never true  Transportation Needs: No Transportation Needs (12/20/2023)   PRAPARE - Administrator, Civil Service (Medical): No    Lack of Transportation (Non-Medical): No  Physical Activity: Insufficiently Active (12/20/2023)   Exercise Vital Sign    Days of Exercise per Week: 1 day    Minutes of Exercise per Session: 10 min  Stress: No Stress Concern Present (12/20/2023)   [place]  [employer] - Occupational Stress Questionnaire    Feeling of Stress: Only a little  Social Connections: Moderately Integrated (12/20/2023)   Social Connection and Isolation Panel    Frequency of Communication with Friends and Family: More than three times a week    Frequency of Social Gatherings with Friends and Family: More than three times a week    Attends Religious Services: More than 4 times per year    Active Member of [employer] or Organizations: Yes    Attends Banker Meetings: More than 4 times per year    Marital Status: Widowed  Intimate Partner Violence: Not At Risk (07/03/2023)   Humiliation, Afraid, Rape, and Kick questionnaire    Fear of Current or Ex-Partner: No    Emotionally Abused: No    Physically Abused: No    Sexually Abused: No    Allergies  Allergen Reactions   Banana Other  (See Comments)    Tingling in mouth.   Sulfa Antibiotics Rash    Current Outpatient Medications  Medication Sig Dispense Refill   aspirin EC 81 MG tablet Take 81 mg by mouth daily.     cetirizine (ZYRTEC) 10 MG tablet Take 10 mg by mouth daily as needed for allergies. prn     cholecalciferol (VITAMIN D ) 1000 UNITS tablet Take 1,000 Units by mouth daily.      COVID-19 mRNA vaccine 2023-2024 (COMIRNATY ) syringe Inject into the muscle. 0.3 mL 0   COVID-19 mRNA vaccine 2024-2025 (COMIRNATY ) syringe Inject 0.3 mLs into the muscle. 0.3 mL 0   JANUVIA  100 MG tablet TAKE 1 TABLET BY MOUTH ONCE DAILY 90 tablet 1   Lancets (ONETOUCH DELICA PLUS LANCET33G) MISC USE TO CHECK BLOOD SUGAR ONCE DAILY AS DIRECTED 100 each 1   latanoprost (XALATAN) 0.005 % ophthalmic solution Place 1 drop into both eyes at bedtime.     ONETOUCH ULTRA test strip USE TO CHECK BLOOD SUGAR 1 time DAILY. 150 strip 1   rosuvastatin  (CRESTOR ) 20 MG tablet TAKE 1 TABLET BY MOUTH ONCE DAILY 90 tablet 1   SYNTHROID  112 MCG tablet TAKE 1 TABLET BY MOUTH monday THROUGH saturday and take 1/2 tablet ON sunday 90 tablet 2   telmisartan -hydrochlorothiazide (MICARDIS  HCT) 80-12.5 MG tablet TAKE 1 TABLET BY MOUTH EVERY DAY 90 tablet 1   triamcinolone  ointment (KENALOG ) 0.1 % apply to affected areas BID for 2 weeks then break for 2 weeks. Repeat PRN. 80 g 2   valACYclovir  (VALTREX ) 500 MG tablet Take 1 tablet (500 mg total) by mouth 2 (two) times daily. Take one tablet twice daily x 1 week at first symptom of fever blister 28 tablet 2   No current facility-administered medications for this visit.    REVIEW OF SYSTEMS:  [X]  denotes positive finding, [ ]  denotes negative finding Cardiac  Comments:  Chest pain or chest pressure: ***   Shortness of breath upon exertion:    Short of breath when lying flat:    Irregular heart rhythm:        Vascular    Pain in calf, thigh, or hip brought on by ambulation:    Pain in feet at night that  wakes you up from your sleep:     Blood clot in your veins:    Leg swelling:         Pulmonary    Oxygen at home:    Productive cough:     Wheezing:         Neurologic  Sudden weakness in arms or legs:     Sudden numbness in arms or legs:     Sudden onset of difficulty speaking or slurred speech:    Temporary loss of vision in one eye:     Problems with dizziness:         Gastrointestinal    Blood in stool:     Vomited blood:         Genitourinary    Burning when urinating:     Blood in urine:        Psychiatric    Major depression:         Hematologic    Bleeding problems:    Problems with blood clotting too easily:        Skin    Rashes or ulcers:        Constitutional    Fever or chills:      PHYSICAL EXAM: There were no vitals filed for this visit.  GENERAL: The patient is a well-nourished female, in no acute distress. The vital signs are documented above. CARDIAC: There is a regular rate and rhythm.  VASCULAR: *** PULMONARY: There is good air exchange bilaterally without wheezing or rales. ABDOMEN: Soft and non-tender with normal pitched bowel sounds.  MUSCULOSKELETAL: There are no major deformities or cyanosis. NEUROLOGIC: No focal weakness or paresthesias are detected. SKIN: There are no ulcers or rashes noted. PSYCHIATRIC: The patient has a normal affect.  DATA:   ABIs on 01/08/2024 that were 1.08 on the right triphasic and 0.99 on the left triphasic.  Assessment/Plan:  73 y.o. female, with history of CKD, diabetes, hypertension who presents for evaluation of decreased pedal pulses.  Discussed that her ABIs are within normal range with no signs of significant arterial insufficiency.  She is appropriately on aspirin statin for risk reduction.   Lonni DOROTHA Gaskins, MD [facility] Office: [phone]

## 2024-01-28 ENCOUNTER — Ambulatory Visit: Attending: Vascular Surgery | Admitting: Vascular Surgery

## 2024-01-28 ENCOUNTER — Encounter: Payer: Self-pay | Admitting: Vascular Surgery

## 2024-01-28 VITALS — BP 135/68 | HR 57 | Temp 97.7°F | Resp 20 | Ht 65.0 in | Wt 194.7 lb

## 2024-01-28 DIAGNOSIS — R0989 Other specified symptoms and signs involving the circulatory and respiratory systems: Secondary | ICD-10-CM

## 2024-01-29 ENCOUNTER — Other Ambulatory Visit (HOSPITAL_BASED_OUTPATIENT_CLINIC_OR_DEPARTMENT_OTHER): Payer: Self-pay

## 2024-01-29 ENCOUNTER — Other Ambulatory Visit: Payer: Self-pay | Admitting: Internal Medicine

## 2024-01-29 DIAGNOSIS — E1122 Type 2 diabetes mellitus with diabetic chronic kidney disease: Secondary | ICD-10-CM

## 2024-01-29 LAB — HM DEXA SCAN: HM Dexa Scan: NORMAL

## 2024-01-29 MED ORDER — FLUZONE HIGH-DOSE 0.5 ML IM SUSY
0.5000 mL | PREFILLED_SYRINGE | Freq: Once | INTRAMUSCULAR | 0 refills | Status: AC
  Filled 2024-01-29: qty 0.5, 1d supply, fill #0

## 2024-02-03 ENCOUNTER — Ambulatory Visit: Payer: Self-pay | Admitting: Internal Medicine

## 2024-02-06 NOTE — Progress Notes (Signed)
 Kelli Bond                                          MRN: [id number]   02/06/2024   The [facility] Quality Team Specialist reviewed this patient medical record for the purposes of chart review for care gap closure. The following were reviewed: abstraction for care gap closure-glycemic status assessment.    [facility] Quality Team

## 2024-02-25 ENCOUNTER — Ambulatory Visit: Payer: Medicare Other | Admitting: Internal Medicine

## 2024-02-25 ENCOUNTER — Encounter: Payer: Self-pay | Admitting: Internal Medicine

## 2024-02-25 VITALS — BP 124/70 | HR 65 | Temp 98.3°F | Ht 65.0 in | Wt 196.8 lb

## 2024-02-25 DIAGNOSIS — Z0000 Encounter for general adult medical examination without abnormal findings: Secondary | ICD-10-CM

## 2024-02-25 DIAGNOSIS — E1122 Type 2 diabetes mellitus with diabetic chronic kidney disease: Secondary | ICD-10-CM

## 2024-02-25 DIAGNOSIS — E039 Hypothyroidism, unspecified: Secondary | ICD-10-CM

## 2024-02-25 DIAGNOSIS — I129 Hypertensive chronic kidney disease with stage 1 through stage 4 chronic kidney disease, or unspecified chronic kidney disease: Secondary | ICD-10-CM | POA: Diagnosis not present

## 2024-02-25 DIAGNOSIS — N1831 Chronic kidney disease, stage 3a: Secondary | ICD-10-CM

## 2024-02-25 DIAGNOSIS — E7800 Pure hypercholesterolemia, unspecified: Secondary | ICD-10-CM

## 2024-02-25 LAB — POCT URINALYSIS DIP (CLINITEK)
Bilirubin, UA: NEGATIVE
Blood, UA: NEGATIVE
Glucose, UA: NEGATIVE mg/dL
Ketones, POC UA: NEGATIVE mg/dL
Leukocytes, UA: NEGATIVE
Nitrite, UA: NEGATIVE
POC PROTEIN,UA: NEGATIVE
Spec Grav, UA: <=1.005 — AB (ref 1.010–1.025)
Urobilinogen, UA: 0.2 U/dL
pH, UA: 5.5 (ref 5.0–8.0)

## 2024-02-25 NOTE — Patient Instructions (Addendum)
 Ozempic - injection Farxiga/Jardiance - oral meds  Health Maintenance, Female Adopting a healthy lifestyle and getting preventive care are important in promoting health and wellness. Ask your health care provider about: The right schedule for you to have regular tests and exams. Things you can do on your own to prevent diseases and keep yourself healthy. What should I know about diet, weight, and exercise? Eat a healthy diet  Eat a diet that includes plenty of vegetables, fruits, low-fat dairy products, and lean protein. Do not eat a lot of foods that are high in solid fats, added sugars, or sodium. Maintain a healthy weight Body mass index (BMI) is used to identify weight problems. It estimates body fat based on height and weight. Your health care provider can help determine your BMI and help you achieve or maintain a healthy weight. Get regular exercise Get regular exercise. This is one of the most important things you can do for your health. Most adults should: Exercise for at least 150 minutes each week. The exercise should increase your heart rate and make you sweat (moderate-intensity exercise). Do strengthening exercises at least twice a week. This is in addition to the moderate-intensity exercise. Spend less time sitting. Even light physical activity can be beneficial. Watch cholesterol and blood lipids Have your blood tested for lipids and cholesterol at 72 years of age, then have this test every 5 years. Have your cholesterol levels checked more often if: Your lipid or cholesterol levels are high. You are older than 72 years of age. You are at high risk for heart disease. What should I know about cancer screening? Depending on your health history and family history, you may need to have cancer screening at various ages. This may include screening for: Breast cancer. Cervical cancer. Colorectal cancer. Skin cancer. Lung cancer. What should I know about heart disease,  diabetes, and high blood pressure? Blood pressure and heart disease High blood pressure causes heart disease and increases the risk of stroke. This is more likely to develop in people who have high blood pressure readings or are overweight. Have your blood pressure checked: Every 3-5 years if you are 70-52 years of age. Every year if you are 55 years old or older. Diabetes Have regular diabetes screenings. This checks your fasting blood sugar level. Have the screening done: Once every three years after age 55 if you are at a normal weight and have a low risk for diabetes. More often and at a younger age if you are overweight or have a high risk for diabetes. What should I know about preventing infection? Hepatitis B If you have a higher risk for hepatitis B, you should be screened for this virus. Talk with your health care provider to find out if you are at risk for hepatitis B infection. Hepatitis C Testing is recommended for: Everyone born from [birth year] through 1964. Anyone with known risk factors for hepatitis C. Sexually transmitted infections (STIs) Get screened for STIs, including gonorrhea and chlamydia, if: You are sexually active and are younger than 72 years of age. You are older than 72 years of age and your health care provider tells you that you are at risk for this type of infection. Your sexual activity has changed since you were last screened, and you are at increased risk for chlamydia or gonorrhea. Ask your health care provider if you are at risk. Ask your health care provider about whether you are at high risk for HIV. Your health care provider may  recommend a prescription medicine to help prevent HIV infection. If you choose to take medicine to prevent HIV, you should first get tested for HIV. You should then be tested every 3 months for as long as you are taking the medicine. Pregnancy If you are about to stop having your period (premenopausal) and you may become pregnant,  seek counseling before you get pregnant. Take 400 to 800 micrograms (mcg) of folic acid every day if you become pregnant. Ask for birth control (contraception) if you want to prevent pregnancy. Osteoporosis and menopause Osteoporosis is a disease in which the bones lose minerals and strength with aging. This can result in bone fractures. If you are 24 years old or older, or if you are at risk for osteoporosis and fractures, ask your health care provider if you should: Be screened for bone loss. Take a calcium  or vitamin D  supplement to lower your risk of fractures. Be given hormone replacement therapy (HRT) to treat symptoms of menopause. Follow these instructions at home: Alcohol use Do not drink alcohol if: Your health care provider tells you not to drink. You are pregnant, may be pregnant, or are planning to become pregnant. If you drink alcohol: Limit how much you have to: 0-1 drink a day. Know how much alcohol is in your drink. In the [place] one drink equals one 12 oz bottle of beer (355 mL), one 5 oz glass of wine (148 mL), or one 1 oz glass of hard liquor (44 mL). Lifestyle Do not use any products that contain nicotine or tobacco. These products include cigarettes, chewing tobacco, and vaping devices, such as e-cigarettes. If you need help quitting, ask your health care provider. Do not use street drugs. Do not share needles. Ask your health care provider for help if you need support or information about quitting drugs. General instructions Schedule regular health, dental, and eye exams. Stay current with your vaccines. Tell your health care provider if: You often feel depressed. You have ever been abused or do not feel safe at home. Summary Adopting a healthy lifestyle and getting preventive care are important in promoting health and wellness. Follow your health care provider's instructions about healthy diet, exercising, and getting tested or screened for diseases. Follow your  health care provider's instructions on monitoring your cholesterol and blood pressure. This information is not intended to replace advice given to you by your health care provider. Make sure you discuss any questions you have with your health care provider. Document Revised: 09/12/2020 Document Reviewed: 09/12/2020 Elsevier Patient Education  2024 [employer].

## 2024-02-25 NOTE — Progress Notes (Signed)
**Note Kelli-Identified via Obfuscation**  I,Kelli Bond, CMA,acting as a neurosurgeon for Kelli LOISE Slocumb, MD.,have documented all relevant documentation on the behalf of Kelli LOISE Slocumb, MD,as directed by  Kelli LOISE Slocumb, MD while in the presence of Kelli LOISE Slocumb, MD.  Subjective:    Patient ID: Kelli Bond , female    DOB: July 10, 1950 , 73 y.o.   MRN: [id number]  Chief Complaint  Patient presents with   Annual Exam    Patient presents today for her HM. She no longer sees a GYN provider. She reports compliance with meds. Denies headaches, chest pain and shortness of breath. She would like to discuss possibly changing the telmisartan -hydrochlorothiazide.    Diabetes   Hypertension   Hypothyroidism    HPI Discussed the use of AI scribe software for clinical note transcription with the patient, who gave verbal consent to proceed.  History of Present Illness Kelli Bond is a 73 year old female with diabetes who presents for a physical and diabetes check.  Her blood sugar levels have been stable, ranging from 99 to 102 mg/dL, with the most recent reading being 102 mg/dL. She attributes this stability to her diet, mentioning she had 'wet ribs and a little rice' the night before. She maintains an active lifestyle, walking at least twice a day for 15 minutes each time. Her current medications include Zyrtec as needed, Januvia , latanoprost eye drops (Xalatan) at night, rosuvastatin  20 mg, Synthroid  (Monday through Saturday and a half on Sunday), and telmisartan  80/12.5 mg.  She experiences constipation, which she attributes to her thyroid medication. She has tried Colace and Miralax without success, finding prune juice to be effective when used sparingly, about once every two weeks. She drinks a lot of water.  An ultrasound of her kidneys in 2022 was normal.  She inquires about the need for another pneumonia shot, having received one in 2016 and another in 2020. No family history of heart disease is reported, and she performs her  own breast exams.   Diabetes She presents for her follow-up diabetic visit. She has type 2 diabetes mellitus. Her disease course has been stable. There are no hypoglycemic associated symptoms. Pertinent negatives for diabetes include no blurred vision. There are no hypoglycemic complications. Diabetic complications include nephropathy. Risk factors for coronary artery disease include diabetes mellitus, dyslipidemia, hypertension, obesity, sedentary lifestyle and post-menopausal. Current diabetic treatment includes oral agent (monotherapy). She is compliant with treatment most of the time. She is following a diabetic diet. She never participates in exercise. An ACE inhibitor/angiotensin II receptor blocker is being taken. Eye exam is not current.  Hypertension This is a chronic problem. The current episode started more than 1 year ago. The problem has been gradually improving since onset. The problem is controlled. Pertinent negatives include no blurred vision. Risk factors for coronary artery disease include diabetes mellitus, dyslipidemia, post-menopausal state and sedentary lifestyle. The current treatment provides moderate improvement.     Past Medical History:  Diagnosis Date   Allergy 2005   pollen ,blooms   Arthritis    Cataract 2020   small   Chronic kidney disease    mild   Diabetes mellitus without complication (HCC)    Essential hypertension, benign 12/28/2013   Hypertension    Pure hypercholesterolemia 12/28/2013   Thyroid disease      Family History  Problem Relation Age of Onset   Bradycardia Mother        pacemaker   Hypertension Father    Kidney disease Father  Cancer Father    Breast cancer Sister    Cancer Sister    Kidney disease Brother    Hypertension Brother    Diabetes Paternal Grandmother    Colon cancer Neg Hx    Colon polyps Neg Hx    Esophageal cancer Neg Hx    Rectal cancer Neg Hx    Stomach cancer Neg Hx      Current Outpatient Medications:     aspirin EC 81 MG tablet, Take 81 mg by mouth daily., Disp: , Rfl:    cetirizine (ZYRTEC) 10 MG tablet, Take 10 mg by mouth daily as needed for allergies. prn, Disp: , Rfl:    cholecalciferol (VITAMIN D ) 1000 UNITS tablet, Take 1,000 Units by mouth daily. , Disp: , Rfl:    COVID-19 mRNA vaccine 2023-2024 (COMIRNATY ) syringe, Inject into the muscle., Disp: 0.3 mL, Rfl: 0   COVID-19 mRNA vaccine 2024-2025 (COMIRNATY ) syringe, Inject 0.3 mLs into the muscle., Disp: 0.3 mL, Rfl: 0   JANUVIA  100 MG tablet, TAKE 1 TABLET BY MOUTH ONCE DAILY, Disp: 90 tablet, Rfl: 1   Lancets (ONETOUCH DELICA PLUS LANCET33G) MISC, USE TO CHECK BLOOD SUGAR ONCE DAILY AS DIRECTED, Disp: 100 each, Rfl: 1   latanoprost (XALATAN) 0.005 % ophthalmic solution, Place 1 drop into both eyes at bedtime., Disp: , Rfl:    ONETOUCH ULTRA test strip, USE TO CHECK BLOOD SUGAR 1 time DAILY., Disp: 150 strip, Rfl: 1   rosuvastatin  (CRESTOR ) 20 MG tablet, TAKE 1 TABLET BY MOUTH ONCE DAILY, Disp: 90 tablet, Rfl: 1   SYNTHROID  112 MCG tablet, TAKE 1 TABLET BY MOUTH monday THROUGH saturday and take 1/2 tablet ON sunday, Disp: 90 tablet, Rfl: 2   telmisartan -hydrochlorothiazide (MICARDIS  HCT) 80-12.5 MG tablet, TAKE 1 TABLET BY MOUTH EVERY DAY, Disp: 90 tablet, Rfl: 1   triamcinolone  ointment (KENALOG ) 0.1 %, apply to affected areas BID for 2 weeks then break for 2 weeks. Repeat PRN., Disp: 80 g, Rfl: 2   valACYclovir  (VALTREX ) 500 MG tablet, Take 1 tablet (500 mg total) by mouth 2 (two) times daily. Take one tablet twice daily x 1 week at first symptom of fever blister, Disp: 28 tablet, Rfl: 2   Allergies  Allergen Reactions   Banana Other (See Comments)    Tingling in mouth.   Sulfa Antibiotics Rash      The patient states she uses post menopausal status for birth control. No LMP recorded. Patient has had a hysterectomy.. Negative for Dysmenorrhea. Negative for: breast discharge, breast lump(s), breast pain and breast self exam.  Associated symptoms include abnormal vaginal bleeding. Pertinent negatives include abnormal bleeding (hematology), anxiety, decreased libido, depression, difficulty falling sleep, dyspareunia, history of infertility, nocturia, sexual dysfunction, sleep disturbances, urinary incontinence, urinary urgency, vaginal discharge and vaginal itching. Diet regular.The patient states her exercise level is  intermittent.  . The patient's tobacco use is:  Social History   Tobacco Use  Smoking Status Never  Smokeless Tobacco Never  . She has been exposed to passive smoke. The patient's alcohol use is:  Social History   Substance and Sexual Activity  Alcohol Use Never    Review of Systems  Constitutional: Negative.   HENT: Negative.    Eyes: Negative.  Negative for blurred vision.  Respiratory: Negative.    Cardiovascular: Negative.   Gastrointestinal: Negative.   Endocrine: Negative.   Genitourinary: Negative.   Musculoskeletal: Negative.   Skin: Negative.   Allergic/Immunologic: Negative.   Neurological: Negative.   Hematological: Negative.  Psychiatric/Behavioral: Negative.       Today's Vitals   02/25/24 1127 02/25/24 1138  BP: (!) 142/80 124/70  Pulse: 65   Temp: 98.3 F (36.8 C)   SpO2: 98%   Weight: 196 lb 12.8 oz (89.3 kg)   Height: 5' 5 (1.651 m)    Body mass index is 32.75 kg/m.  Wt Readings from Last 3 Encounters:  02/25/24 196 lb 12.8 oz (89.3 kg)  01/28/24 194 lb 11.2 oz (88.3 kg)  12/23/23 195 lb 9.6 oz (88.7 kg)     Objective:  Physical Exam Vitals and nursing note reviewed.  Constitutional:      Appearance: Normal appearance.  HENT:     Head: Normocephalic and atraumatic.     Right Ear: Tympanic membrane, ear canal and external ear normal.     Left Ear: Tympanic membrane, ear canal and external ear normal.     Nose: Nose normal.     Mouth/Throat:     Mouth: Mucous membranes are moist.     Pharynx: Oropharynx is clear.  Eyes:     Extraocular  Movements: Extraocular movements intact.     Conjunctiva/sclera: Conjunctivae normal.     Pupils: Pupils are equal, round, and reactive to light.  Cardiovascular:     Rate and Rhythm: Normal rate and regular rhythm.     Pulses: Normal pulses.          Dorsalis pedis pulses are 2+ on the right side and 2+ on the left side.     Heart sounds: Normal heart sounds.  Pulmonary:     Effort: Pulmonary effort is normal.     Breath sounds: Normal breath sounds.  Chest:  Breasts:    Tanner Score is 5.     Right: Normal.     Left: Normal.  Abdominal:     General: Bowel sounds are normal.     Palpations: Abdomen is soft.  Genitourinary:    Comments: deferred Musculoskeletal:        General: Normal range of motion.     Cervical back: Normal range of motion and neck supple.  Feet:     Right foot:     Protective Sensation: 5 sites tested.  5 sites sensed.     Skin integrity: Dry skin present.     Toenail Condition: Right toenails are normal.     Left foot:     Protective Sensation: 5 sites tested.  5 sites sensed.     Skin integrity: Dry skin present.     Toenail Condition: Left toenails are normal.  Skin:    General: Skin is warm and dry.  Neurological:     General: No focal deficit present.     Mental Status: She is alert and oriented to person, place, and time.  Psychiatric:        Mood and Affect: Mood normal.        Behavior: Behavior normal.         Assessment And Plan:     Encounter for general adult medical examination w/o abnormal findings Assessment & Plan: A full exam was performed.  Importance of monthly self breast exams was discussed with the patient.  She is advised to get 30-45 minutes of regular exercise, no less than four to five days per week. Both weight-bearing and aerobic exercises are recommended.  She is advised to follow a healthy diet with at least six fruits/veggies per day, decrease intake of red meat and other saturated fats and to  increase fish intake to  twice weekly.  Meats/fish should not be fried -- baked, boiled or broiled is preferable. It is also important to cut back on your sugar intake.  Be sure to read labels - try to avoid anything with added sugar, high fructose corn syrup or other sweeteners.  If you must use a sweetener, you can try stevia or monkfruit.  It is also important to avoid artificially sweetened foods/beverages and diet drinks. Lastly, wear SPF 50 sunscreen on exposed skin and when in direct sunlight for an extended period of time.  Be sure to avoid fast food restaurants and aim for at least 60 ounces of water daily.       Type 2 diabetes mellitus with stage 3a chronic kidney disease, without long-term current use of insulin (HCC) Assessment & Plan: Type 2 diabetes with well-controlled glucose levels and stage 3 CKD, likely due to hydration status. - Consider SGLT2 inhibitors (Farxiga or Jardiance) to improve kidney function and reduce cardiovascular risk. Discussed potential side effects. - Start Farxiga or Jardiance one month before December follow-up. - Discontinue Januvia  when starting new medication. - Monitor kidney function post-initiation of new medication. - Discussed Ozempic as an alternative for heart and kidney protection. - Order cardiac calcium  score for coronary artery calcifications.   Hypertensive nephropathy Assessment & Plan: Chronic, controlled.  EKG performed, marked sinus bradycardia.  - Continue with telmisartan  - Follow low sodium diet.  - Follow up in four months  Orders: -     POCT URINALYSIS DIP (CLINITEK) -     Microalbumin / creatinine urine ratio -     EKG 12-Lead  Primary hypothyroidism Assessment & Plan: Hypothyroidism managed with Synthroid . Constipation likely related to thyroid condition. - Continue Synthroid  112mcg M-Sat and 1/2 tab on Sundays. - Use prune juice as needed for constipation.   Pure hypercholesterolemia Assessment & Plan: Chronic, LDL goal is less than 70.   She will continue with rosuvastatin  20mg  daily.  - Follow heart healthy lifestyle.   Orders: -     CT CARDIAC SCORING (SELF PAY ONLY); Future    Return in 8 weeks (on 04/21/2024), or dm check - f/u Jardiance, for 1 year physical. Patient was given opportunity to ask questions. Patient verbalized understanding of the plan and was able to repeat key elements of the plan. All questions were answered to their satisfaction.   I, Kelli LOISE Slocumb, MD, have reviewed all documentation for this visit. The documentation on 02/25/24 for the exam, diagnosis, procedures, and orders are all accurate and complete.

## 2024-02-26 ENCOUNTER — Other Ambulatory Visit (HOSPITAL_BASED_OUTPATIENT_CLINIC_OR_DEPARTMENT_OTHER): Payer: Self-pay

## 2024-02-26 LAB — MICROALBUMIN / CREATININE URINE RATIO
Creatinine, Urine: 33.6 mg/dL
Microalb/Creat Ratio: <9 mg/g{creat} (ref 0–29)
Microalbumin, Urine: <3 ug/mL

## 2024-02-26 MED ORDER — COMIRNATY 30 MCG/0.3ML IM SUSY
0.3000 mL | PREFILLED_SYRINGE | Freq: Once | INTRAMUSCULAR | 0 refills | Status: AC
  Filled 2024-02-26: qty 0.3, 1d supply, fill #0

## 2024-02-27 ENCOUNTER — Other Ambulatory Visit (HOSPITAL_BASED_OUTPATIENT_CLINIC_OR_DEPARTMENT_OTHER): Payer: Self-pay

## 2024-03-02 ENCOUNTER — Ambulatory Visit: Payer: Self-pay | Admitting: Internal Medicine

## 2024-03-04 NOTE — Assessment & Plan Note (Signed)
 Type 2 diabetes with well-controlled glucose levels and stage 3 CKD, likely due to hydration status. - Consider SGLT2 inhibitors (Farxiga or Jardiance) to improve kidney function and reduce cardiovascular risk. Discussed potential side effects. - Start Farxiga or Jardiance one month before December follow-up. - Discontinue Januvia  when starting new medication. - Monitor kidney function post-initiation of new medication. - Discussed Ozempic as an alternative for heart and kidney protection. - Order cardiac calcium  score for coronary artery calcifications.

## 2024-03-04 NOTE — Assessment & Plan Note (Signed)

## 2024-03-04 NOTE — Assessment & Plan Note (Signed)
 Chronic, controlled.  EKG performed, marked sinus bradycardia.  - Continue with telmisartan  - Follow low sodium diet.  - Follow up in four months

## 2024-03-04 NOTE — Assessment & Plan Note (Signed)
 Hypothyroidism managed with Synthroid . Constipation likely related to thyroid condition. - Continue Synthroid  112mcg M-Sat and 1/2 tab on Sundays. - Use prune juice as needed for constipation.

## 2024-03-04 NOTE — Assessment & Plan Note (Signed)
 Chronic, LDL goal is less than 70.  She will continue with rosuvastatin  20mg  daily.  - Follow heart healthy lifestyle.

## 2024-03-12 ENCOUNTER — Other Ambulatory Visit (HOSPITAL_BASED_OUTPATIENT_CLINIC_OR_DEPARTMENT_OTHER)

## 2024-03-18 ENCOUNTER — Ambulatory Visit (HOSPITAL_BASED_OUTPATIENT_CLINIC_OR_DEPARTMENT_OTHER)
Admission: RE | Admit: 2024-03-18 | Discharge: 2024-03-18 | Disposition: A | Discharge status: Home / Self Care | Payer: Self-pay | Source: Ambulatory Visit | Attending: Internal Medicine | Admitting: Internal Medicine

## 2024-03-18 DIAGNOSIS — E7800 Pure hypercholesterolemia, unspecified: Secondary | ICD-10-CM | POA: Insufficient documentation

## 2024-04-08 ENCOUNTER — Ambulatory Visit: Admitting: Dermatology

## 2024-04-08 ENCOUNTER — Encounter: Payer: Self-pay | Admitting: Dermatology

## 2024-04-08 VITALS — BP 145/79

## 2024-04-08 DIAGNOSIS — L814 Other melanin hyperpigmentation: Secondary | ICD-10-CM

## 2024-04-08 DIAGNOSIS — L209 Atopic dermatitis, unspecified: Secondary | ICD-10-CM | POA: Diagnosis not present

## 2024-04-08 NOTE — Patient Instructions (Signed)

## 2024-04-08 NOTE — Progress Notes (Unsigned)
   Follow-Up Visit   Subjective  Kelli Bond is a 73 y.o. female established patient who presents for FOLLOW UP on the diagnoses listed below:  Patient was last evaluated on 10/23/23.   Atopic Derm: Pt stated that TMC resolved neck flare. She has not had a flare since last OV.    Periorbital PIH: Pt has been using the Eucerin RT line - SPF, serum, & night cream. She stated that it is working well. She see some improvement.    The following portions of the chart were reviewed this encounter and updated as appropriate: medications, allergies, medical history  Review of Systems:  No other skin or systemic complaints except as noted in HPI or Assessment and Plan.  Objective  Well appearing patient in no apparent distress; mood and affect are within normal limits.   A focused examination was performed of the following areas: face   Relevant exam findings are noted in the Assessment and Plan.     Assessment & Plan   ATOPIC DERMATITIS Exam: clear on exam today  stable  Treatment Plan: - Restart TMC PRN for flares - apply BID for 2 weeks then stop. Repeat PRN.    Periorbital hyperpigmentation Improvement since June with Eucerin Radiant Tone serum and sunscreen. Continued use of rock retinol at night. Improvement is slow and steady, with potential for further enhancement. Emphasized the importance of continued use of creams and sunscreen to maintain results, as discontinuation may lead to recurrence.   - Continue Eucerin Radiant Tone serum twice daily. - Continue Eucerin Radiant Tone sunscreen daily. - Continue rock retinol at night, with option to use twice daily if tolerated. - Maintain sunscreen use to prevent recurrence. - Consider using QR code for [employer] financial access and coupons for Eucerin products.   No follow-ups on file.   Documentation: I have reviewed the above documentation for accuracy and completeness, and I agree with the above.  I, Shirron Maranda,  CMA II, am acting as scribe for:  Delon Lenis, DO

## 2024-04-15 ENCOUNTER — Encounter: Payer: Self-pay | Admitting: Internal Medicine

## 2024-04-21 ENCOUNTER — Ambulatory Visit: Payer: Self-pay | Admitting: Internal Medicine

## 2024-04-21 VITALS — BP 110/60 | HR 69 | Temp 97.6°F | Ht 65.0 in | Wt 193.0 lb

## 2024-04-21 DIAGNOSIS — E66811 Obesity, class 1: Secondary | ICD-10-CM

## 2024-04-21 DIAGNOSIS — N1831 Chronic kidney disease, stage 3a: Secondary | ICD-10-CM

## 2024-04-21 DIAGNOSIS — I129 Hypertensive chronic kidney disease with stage 1 through stage 4 chronic kidney disease, or unspecified chronic kidney disease: Secondary | ICD-10-CM

## 2024-04-21 DIAGNOSIS — E7800 Pure hypercholesterolemia, unspecified: Secondary | ICD-10-CM

## 2024-04-21 MED ORDER — TELMISARTAN-HCTZ 80-12.5 MG PO TABS: 1.0000 | ORAL_TABLET | Freq: Every day | ORAL | 2 refills | Status: AC

## 2024-04-21 MED ORDER — ROSUVASTATIN CALCIUM 20 MG PO TABS: 20.0000 mg | ORAL_TABLET | Freq: Every day | ORAL | 2 refills | Status: AC

## 2024-04-21 MED ORDER — SYNTHROID 112 MCG PO TABS: ORAL_TABLET | ORAL | 2 refills | Status: AC

## 2024-04-21 MED ORDER — DAPAGLIFLOZIN PROPANEDIOL 10 MG PO TABS: 10.0000 mg | ORAL_TABLET | Freq: Every day | ORAL | 2 refills | Status: DC

## 2024-04-21 NOTE — Patient Instructions (Signed)

## 2024-04-21 NOTE — Assessment & Plan Note (Signed)
 She is encouraged to strive for BMI less than 30 to decrease cardiac risk. Advised to aim for at least 150 minutes of exercise per week.

## 2024-04-21 NOTE — Assessment & Plan Note (Signed)
 Chronic, well controlled.

## 2024-04-21 NOTE — Assessment & Plan Note (Signed)
 Chronic, doing well with Farxiga  10mg  daily.

## 2024-04-21 NOTE — Progress Notes (Unsigned)
 I,Kelli Bond, CMA,acting as a neurosurgeon for Kelli LOISE Slocumb, MD.,have documented all relevant documentation on the behalf of Kelli LOISE Slocumb, MD,as directed by  Kelli LOISE Slocumb, MD while in the presence of Kelli LOISE Slocumb, MD.  Subjective:  Patient ID: Kelli Bond , female    DOB: 07-Feb-1951 , 73 y.o.   MRN: [id number]  Chief Complaint  Patient presents with   Diabetes    Patient presents today for a diabetes check. Patient reports she is doing well and tolerating the Farxiga  well. Patient reports she just goes to the bathroom more.     HPI Discussed the use of AI scribe software for clinical note transcription with the patient, who gave verbal consent to proceed.  History of Present Illness Kelli Bond is a 73 year old female with diabetes who presents for a diabetes check and follow-up on Farxiga .  She feels 'sleepy' and is unsure if it is related to her medication. She has been hydrating well, aiming for at least four bottles of water a day, though sometimes only three, and occasionally drinks iced tea. She has stopped consuming soda after noticing an increase in her blood sugar levels.  She has been taking Farxiga  and has discontinued Januvia . Her current medications include telmisartan  80/12.5 mg, Synthroid  with a dosing schedule of one tablet Monday through Saturday and a half on Sunday, rosuvastatin , Pristiq, Lanoprost for her eyes, and a baby coated aspirin. She has been taking aspirin for years.  She recalls visiting an orthopedist in September for shoulder pain, which has since resolved after physical therapy. She also had a vascular test and a GI doctor visit. An ultrasound of her kidneys was performed two to three years ago and was normal.   Diabetes She presents for her follow-up diabetic visit. She has type 2 diabetes mellitus. Her disease course has been stable. There are no hypoglycemic associated symptoms. Pertinent negatives for diabetes include no blurred vision,  no chest pain, no polydipsia, no polyphagia and no polyuria. There are no hypoglycemic complications. Risk factors for coronary artery disease include diabetes mellitus, dyslipidemia, hypertension, obesity, sedentary lifestyle and post-menopausal. She participates in exercise intermittently. Eye exam is current.  Hypertension This is a chronic problem. The current episode started more than 1 year ago. The problem has been gradually improving since onset. The problem is controlled. Associated symptoms include neck pain. Pertinent negatives include no blurred vision, chest pain, palpitations or shortness of breath. Risk factors for coronary artery disease include diabetes mellitus, dyslipidemia, post-menopausal state and sedentary lifestyle.     Past Medical History:  Diagnosis Date   Allergy 2005   pollen ,blooms   Arthritis    Cataract 2020   small   Chronic kidney disease    mild   Diabetes mellitus without complication (HCC)    Essential hypertension, benign 12/28/2013   Hypertension    Pure hypercholesterolemia 12/28/2013   Thyroid disease      Family History  Problem Relation Age of Onset   Bradycardia Mother        pacemaker   Hypertension Father    Kidney disease Father    Cancer Father    Breast cancer Sister    Cancer Sister    Kidney disease Brother    Hypertension Brother    Diabetes Paternal Grandmother    Colon cancer Neg Hx    Colon polyps Neg Hx    Esophageal cancer Neg Hx    Rectal cancer Neg Hx  Stomach cancer Neg Hx     Current Medications[1]   Allergies[2]   Review of Systems  Constitutional: Negative.   Eyes:  Negative for blurred vision.  Respiratory: Negative.  Negative for shortness of breath.   Cardiovascular: Negative.  Negative for chest pain and palpitations.  Gastrointestinal: Negative.   Endocrine: Negative for polydipsia, polyphagia and polyuria.  Musculoskeletal:  Positive for neck pain.  Neurological: Negative.    Psychiatric/Behavioral: Negative.       Today's Vitals   04/21/24 1435  BP: 110/60  Pulse: 69  Temp: 97.6 F (36.4 C)  TempSrc: Oral  Weight: 193 lb (87.5 kg)  Height: 5' 5 (1.651 m)  PainSc: 0-No pain   Body mass index is 32.12 kg/m.  Wt Readings from Last 3 Encounters:  04/21/24 193 lb (87.5 kg)  02/25/24 196 lb 12.8 oz (89.3 kg)  01/28/24 194 lb 11.2 oz (88.3 kg)    The 10-year ASCVD risk score (Arnett DK, et al., 2019) is: 15.8%   Values used to calculate the score:     Age: 54 years     Clinically relevant sex: Female     Is Non-Hispanic African American: Yes     Diabetic: Yes     Tobacco smoker: No     Systolic Blood Pressure: 110 mmHg     Is BP treated: Yes     HDL Cholesterol: 48 mg/dL     Total Cholesterol: 133 mg/dL  Objective:  Physical Exam Vitals and nursing note reviewed.  Constitutional:      Appearance: Normal appearance.  HENT:     Head: Normocephalic and atraumatic.  Eyes:     Extraocular Movements: Extraocular movements intact.  Cardiovascular:     Rate and Rhythm: Normal rate and regular rhythm.     Heart sounds: Normal heart sounds.  Pulmonary:     Effort: Pulmonary effort is normal.     Breath sounds: Normal breath sounds.  Musculoskeletal:     Cervical back: Normal range of motion.  Skin:    General: Skin is warm.  Neurological:     General: No focal deficit present.     Mental Status: She is alert.  Psychiatric:        Mood and Affect: Mood normal.        Behavior: Behavior normal.         Assessment And Plan:   Assessment & Plan Type 2 diabetes mellitus with stage 3a chronic kidney disease, without long-term current use of insulin (HCC) Chronic, doing well with Farxiga  10mg  daily.  Kidney function stable, no proteinuria. Farxiga  used for diabetes management. - Prescribed Farxiga , provided samples until insurance approval. - Provide Jardiance samples if insurance requires switch. - Schedule kidney function test if  medication is switched. Hypertensive nephropathy Chronic, well controlled.    - Continue with telmisartan  - Follow low sodium diet.  - Follow up in four months. Pure hypercholesterolemia Calcium  score zero, low cardiovascular risk. Aspirin continuation advised despite recent guidelines. - Continue rosuvastatin . - Via shared decision making, she will adjust aspirin to Monday through Friday, skip weekends. Class 1 obesity due to excess calories with serious comorbidity and body mass index (BMI) of 32.0 to 32.9 in adult She is encouraged to strive for BMI less than 30 to decrease cardiac risk. Advised to aim for at least 150 minutes of exercise per week.   Orders Placed This Encounter  Procedures   Hemoglobin A1c   BMP8+EGFR   Return for controlled DM check-4  months.  Patient was given opportunity to ask questions. Patient verbalized understanding of the plan and was able to repeat key elements of the plan. All questions were answered to their satisfaction.   I, Kelli LOISE Slocumb, MD, have reviewed all documentation for this visit. The documentation on 04/21/2024 for the exam, diagnosis, procedures, and orders are all accurate and complete.   IF YOU HAVE BEEN REFERRED TO A SPECIALIST, IT MAY TAKE 1-2 WEEKS TO SCHEDULE/PROCESS THE REFERRAL. IF YOU HAVE NOT HEARD FROM US /SPECIALIST IN TWO WEEKS, PLEASE GIVE US  A CALL AT [phone].      [1]  Current Outpatient Medications:    aspirin EC 81 MG tablet, Take 81 mg by mouth daily., Disp: , Rfl:    cetirizine (ZYRTEC) 10 MG tablet, Take 10 mg by mouth daily as needed for allergies. prn, Disp: , Rfl:    cholecalciferol (VITAMIN D ) 1000 UNITS tablet, Take 1,000 Units by mouth daily. , Disp: , Rfl:    COVID-19 mRNA vaccine 2023-2024 (COMIRNATY ) syringe, Inject into the muscle., Disp: 0.3 mL, Rfl: 0   COVID-19 mRNA vaccine 2024-2025 (COMIRNATY ) syringe, Inject 0.3 mLs into the muscle., Disp: 0.3 mL, Rfl: 0   dapagliflozin  propanediol  (FARXIGA ) 10 MG TABS tablet, Take 1 tablet (10 mg total) by mouth daily., Disp: 30 tablet, Rfl: 2   JANUVIA  100 MG tablet, TAKE 1 TABLET BY MOUTH ONCE DAILY (Patient not taking: Reported on 04/21/2024), Disp: 90 tablet, Rfl: 1   Lancets (ONETOUCH DELICA PLUS LANCET33G) MISC, USE TO CHECK BLOOD SUGAR ONCE DAILY AS DIRECTED, Disp: 100 each, Rfl: 1   latanoprost (XALATAN) 0.005 % ophthalmic solution, Place 1 drop into both eyes at bedtime., Disp: , Rfl:    ONETOUCH ULTRA test strip, USE TO CHECK BLOOD SUGAR 1 time DAILY., Disp: 150 strip, Rfl: 1   rosuvastatin  (CRESTOR ) 20 MG tablet, Take 1 tablet (20 mg total) by mouth daily., Disp: 90 tablet, Rfl: 2   SYNTHROID  112 MCG tablet, TAKE 1 TABLET BY MOUTH monday THROUGH saturday and take 1/2 tablet ON sunday, Disp: 90 tablet, Rfl: 2   telmisartan -hydrochlorothiazide (MICARDIS  HCT) 80-12.5 MG tablet, Take 1 tablet by mouth daily., Disp: 90 tablet, Rfl: 2   triamcinolone  ointment (KENALOG ) 0.1 %, apply to affected areas BID for 2 weeks then break for 2 weeks. Repeat PRN., Disp: 80 g, Rfl: 2   valACYclovir  (VALTREX ) 500 MG tablet, Take 1 tablet (500 mg total) by mouth 2 (two) times daily. Take one tablet twice daily x 1 week at first symptom of fever blister, Disp: 28 tablet, Rfl: 2 [2]  Allergies Allergen Reactions   Banana Other (See Comments)    Tingling in mouth.   Sulfa Antibiotics Rash

## 2024-04-22 LAB — BMP8+EGFR
BUN/Creatinine Ratio: 11 — ABNORMAL LOW (ref 12–28)
BUN: 14 mg/dL (ref 8–27)
CO2: 24 mmol/L (ref 20–29)
Calcium: 10 mg/dL (ref 8.7–10.3)
Chloride: 99 mmol/L (ref 96–106)
Creatinine, Ser: 1.22 mg/dL — ABNORMAL HIGH (ref 0.57–1.00)
Glucose: 79 mg/dL (ref 70–99)
Potassium: 4.2 mmol/L (ref 3.5–5.2)
Sodium: 139 mmol/L (ref 134–144)
eGFR: 47 mL/min/1.73 — ABNORMAL LOW (ref >59)

## 2024-04-22 LAB — HEMOGLOBIN A1C
Est. average glucose Bld gHb Est-mCnc: 140 mg/dL
Hgb A1c MFr Bld: 6.5 % — ABNORMAL HIGH (ref 4.8–5.6)

## 2024-04-22 NOTE — Assessment & Plan Note (Signed)
 Calcium  score zero, low cardiovascular risk. Aspirin continuation advised despite recent guidelines. - Continue rosuvastatin . - Via shared decision making, she will adjust aspirin to Monday through Friday, skip weekends.

## 2024-04-26 ENCOUNTER — Ambulatory Visit: Payer: Self-pay | Admitting: Internal Medicine

## 2024-06-03 ENCOUNTER — Other Ambulatory Visit: Payer: Self-pay | Admitting: Internal Medicine

## 2024-06-12 ENCOUNTER — Encounter: Payer: Self-pay | Admitting: *Deleted

## 2024-06-12 LAB — HEMOGLOBIN A1C: Hemoglobin A1C: 6.3

## 2024-06-12 LAB — AMB RESULTS CONSOLE CBG: Glucose: 108

## 2024-06-12 NOTE — Progress Notes (Signed)
 Patient attended a screening event on 06/12/24 where her BP was 130/70, blood glucose was 108, and A1C  was 6.3. Patient did not indicate if she had a PCP, has insurance, and does not smoke. No SDOH needs indicated.

## 2024-07-29 ENCOUNTER — Ambulatory Visit: Payer: Self-pay

## 2024-07-29 ENCOUNTER — Ambulatory Visit: Payer: Medicare Other

## 2024-08-25 ENCOUNTER — Ambulatory Visit: Payer: Self-pay | Admitting: Internal Medicine

## 2025-03-03 ENCOUNTER — Encounter: Payer: Self-pay | Admitting: Internal Medicine

## 2025-03-09 ENCOUNTER — Encounter: Admitting: Internal Medicine
